# Patient Record
Sex: Female | Born: 1935 | Race: White | Hispanic: No | Marital: Married | Smoking: Former smoker
Health system: Southern US, Community
[De-identification: ages and names within clinical notes are randomized; demographics above are authoritative.]

## PROBLEM LIST (undated history)

## (undated) DIAGNOSIS — J189 Pneumonia, unspecified organism: Secondary | ICD-10-CM

## (undated) DIAGNOSIS — F0390 Unspecified dementia without behavioral disturbance: Secondary | ICD-10-CM

## (undated) DIAGNOSIS — K589 Irritable bowel syndrome without diarrhea: Secondary | ICD-10-CM

## (undated) DIAGNOSIS — Z7952 Long term (current) use of systemic steroids: Secondary | ICD-10-CM

## (undated) DIAGNOSIS — I509 Heart failure, unspecified: Secondary | ICD-10-CM

## (undated) DIAGNOSIS — S72009A Fracture of unspecified part of neck of unspecified femur, initial encounter for closed fracture: Secondary | ICD-10-CM

## (undated) DIAGNOSIS — T7840XA Allergy, unspecified, initial encounter: Secondary | ICD-10-CM

## (undated) DIAGNOSIS — M797 Fibromyalgia: Secondary | ICD-10-CM

## (undated) DIAGNOSIS — R002 Palpitations: Secondary | ICD-10-CM

## (undated) HISTORY — DX: Long term (current) use of systemic steroids: Z79.52

## (undated) HISTORY — DX: Pneumonia, unspecified organism: J18.9

## (undated) HISTORY — DX: Allergy, unspecified, initial encounter: T78.40XA

## (undated) HISTORY — DX: Palpitations: R00.2

## (undated) HISTORY — PX: COLONOSCOPY: SHX174

## (undated) HISTORY — DX: Fracture of unspecified part of neck of unspecified femur, initial encounter for closed fracture: S72.009A

## (undated) HISTORY — PX: CYST EXCISION: SHX5701

## (undated) HISTORY — DX: Heart failure, unspecified: I50.9

## (undated) HISTORY — DX: Fibromyalgia: M79.7

## (undated) HISTORY — DX: Irritable bowel syndrome without diarrhea: K58.9

---
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
## 1980-02-06 HISTORY — PX: NASAL SINUS SURGERY: SHX719

## 1996-02-06 HISTORY — PX: CHOLECYSTECTOMY: SHX55

## 1997-07-26 ENCOUNTER — Ambulatory Visit (HOSPITAL_COMMUNITY): Admission: RE | Admit: 1997-07-26 | Discharge: 1997-07-26 | Payer: Self-pay | Admitting: Gastroenterology

## 1997-11-21 ENCOUNTER — Emergency Department (HOSPITAL_COMMUNITY): Admission: EM | Admit: 1997-11-21 | Discharge: 1997-11-21 | Payer: Self-pay

## 1998-10-14 ENCOUNTER — Other Ambulatory Visit: Admission: RE | Admit: 1998-10-14 | Discharge: 1998-10-14 | Payer: Self-pay | Admitting: *Deleted

## 2000-05-07 ENCOUNTER — Emergency Department (HOSPITAL_COMMUNITY): Admission: EM | Admit: 2000-05-07 | Discharge: 2000-05-07 | Payer: Self-pay | Admitting: Emergency Medicine

## 2000-09-04 ENCOUNTER — Encounter (INDEPENDENT_AMBULATORY_CARE_PROVIDER_SITE_OTHER): Payer: Self-pay | Admitting: Specialist

## 2000-09-04 ENCOUNTER — Ambulatory Visit (HOSPITAL_COMMUNITY): Admission: RE | Admit: 2000-09-04 | Discharge: 2000-09-04 | Payer: Self-pay | Admitting: Gastroenterology

## 2001-12-16 ENCOUNTER — Other Ambulatory Visit: Admission: RE | Admit: 2001-12-16 | Discharge: 2001-12-16 | Payer: Self-pay | Admitting: *Deleted

## 2003-11-25 ENCOUNTER — Ambulatory Visit (HOSPITAL_COMMUNITY): Admission: RE | Admit: 2003-11-25 | Discharge: 2003-11-25 | Payer: Self-pay | Admitting: Gastroenterology

## 2003-12-14 ENCOUNTER — Ambulatory Visit: Payer: Self-pay | Admitting: Gastroenterology

## 2003-12-22 ENCOUNTER — Ambulatory Visit (HOSPITAL_COMMUNITY): Admission: RE | Admit: 2003-12-22 | Discharge: 2003-12-22 | Payer: Self-pay | Admitting: Gastroenterology

## 2003-12-22 ENCOUNTER — Ambulatory Visit: Payer: Self-pay | Admitting: Gastroenterology

## 2004-11-28 ENCOUNTER — Observation Stay (HOSPITAL_COMMUNITY): Admission: AD | Admit: 2004-11-28 | Discharge: 2004-11-30 | Payer: Self-pay | Admitting: Internal Medicine

## 2004-11-28 ENCOUNTER — Ambulatory Visit: Payer: Self-pay | Admitting: Gastroenterology

## 2005-09-06 ENCOUNTER — Other Ambulatory Visit: Admission: RE | Admit: 2005-09-06 | Discharge: 2005-09-06 | Payer: Self-pay | Admitting: *Deleted

## 2006-07-18 ENCOUNTER — Emergency Department (HOSPITAL_COMMUNITY): Admission: EM | Admit: 2006-07-18 | Discharge: 2006-07-19 | Payer: Self-pay | Admitting: Emergency Medicine

## 2010-06-23 NOTE — Procedures (Signed)
[facility]  Patient:    [name], [name]                      MRN: [id number] Proc. Date: 09/04/00 Adm. Date:  [id number] Attending:  Mardella Layman                           Procedure Report  Ms. Palma is a 77 year old white female who has a family history of colon carcinoma in her father.  She has a long history of laxative-dependent constipation.  It is felt that full colonoscopy is indicated for diagnostic purposes.  The risks and benefits of this procedure explained in detail, and she agreed to proceed as planned.  Preoperative coronary, pulmonary, and mental status exams were unremarkable.  Throughout this procedure, she was on pulse oximetry and cardiac monitorization.  COLONOSCOPY REPORT:  The patient was sedated during this procedure with 100 mcg of IV fentanyl and 5 mg of IV Versed.  Inspection of her rectum showed large, bulging external hemorrhoids.  Her rectum was intubated using the Olympus adult video colonoscope.  This was advanced without difficulty throughout the length of the length of the colon and the cecum.  In the cecum, there was a 17-18 mm bilobed sessile polyp.  This was removed with electrocautery and snared on an 18 watt coag setting.  The polyp was retrieved and sent to pathology for exam. There was an additional smaller polyp that was removed with the Williams hot biopsy forceps and labeled specimen #2.  The colonoscope was then slowly withdrawn throughout the length of the colon which otherwise was free of cyst or mucosal polypoid lesions.  She was extubated without difficulty and tolerated this procedure well.  ASSESSMENT: 1. Large cecal polyp - rule out carcinoma. 2. Family history of colon carcinoma. 3. Recurrent rectal bleeding from large external hemorrhoids.  RECOMMENDATIONS: 1. Standard postpolypectomy orders and precautions. 2. Follow up on pathology result of polyp. 3. Follow-up colonoscopy in 2-3 years  time. 4. Local hemorrhoidal care.  The patient may perhaps need hemorrhoidectomy. DD:  09/04/00 TD:  09/04/00 Job: [id number]

## 2010-06-23 NOTE — H&P (Signed)
NAMEJACQUE, [name] NO.:  [id number]   MEDICAL RECORD NO.:  [id number]          PATIENT TYPE:  INP   LOCATION:  5158                         FACILITY:  [facility]   PHYSICIAN:  Vania Rea. Jarold Motto, M.D. [facility] OF BIRTH:  [birth year]   DATE OF ADMISSION:  11/28/2004  DATE OF DISCHARGE:                                HISTORY & PHYSICAL   CHIEF COMPLAINT:  Abdominal pain, bloating, and bleeding per rectum.   HISTORY OF PRESENT ILLNESS:  [name] is a 77 year old white female who  has a history of IBS which is prone to chronic constipation.  She is  laxative-dependent for decades.  Lately, her laxative regimen has consisted  mostly of Dulcolax.  Generally, she uses Dulcolax every other day and has a  bowel movement every other day.  Her stools are generally watery when she  does have them.  She generally has not had any melena or blood.  Two days  prior to admission, that evening, she had taken her Dulcolax.  Early that  morning at about 4 or 5 a.m. she awoke with intense cramping abdominal pain  and proceeded to have watery stools, dizziness, nausea, and generally  feeling unwell and aching all over.  She was not having any fevers.  At some  point during her stooling she did see some bright red blood per rectum.  However, this was not hematochezia.  The pain was mostly on the right side  of her abdomen, though it did spread to both sides of the abdomen.  The rest  of the day she did not feel great but the pain had subsided to a great  extent and she did not have repeat bleeding or stools.  She had an  appointment scheduled previously to see Dr. Jarold Motto in the office and she  saw him on the 24th of October.  She was quite anxious regarding the intense  severity of the pain that she had just recently had.  She was convinced that  there was something ominous about what had happened to her.  Dr. Jarold Motto  was somewhat concerned by the history of bleeding per rectum and  elected to  admit her for supportive care, laboratories, and a CT scan of the abdomen to  rule out ischemic colitis which was his primary concern.   ALLERGIES:  She has allergies to DEMEROL, HYDROMORPHONE, CODEINE, NOVACAINE,  COMPAZINE, TETANUS SHOT.   CURRENT MEDICATIONS:  1.  Protonix 40 mg daily.  2.  Valium 5 mg p.o. once or twice a day as needed.  3.  Tylenol as needed.  4.  Dulcolax two p.o. every other day.   PAST MEDICAL HISTORY:  1.  Irritable bowel syndrome, constipation prone and laxative-dependent.  2.  History of colon polyps and family history of colon cancer.  Her latest      colonoscopy was performed November 2005.  This was a normal examination      to the cecum.  He did not see AVMs, colitis, evidence for inflammatory      bowel disease nor did he see diverticulosis or melanosis  coli.  3.  Status post __________ marker study within normal limits.  4.  Status post cholecystectomy.  5.  Para 2 status post spontaneous abortion and status post one vaginal      delivery.  6.  Multiple drug intolerances as listed above.  7.  Severe anxiety.  8.  History of external hemorrhoids seen on prior colonoscopy of 2002.  9.  History of cataracts.  She has had surgical extraction on the left and      she also has a right-sided cataract which has not required surgery to      date.  10. Status post lung surgery twice in 1956.  Patient does not know the      details but it may have been some complications from an infection and it      sounds like she may have had a partial lobectomy and this was on the      left side.  11. Status post sinus surgery bilaterally.   SOCIAL HISTORY:  Patient lives in [place] with her husband of 48 years.  They have one adult son.  She quit smoking when she was in her 90s.  She  does not consume alcoholic beverages.   FAMILY HISTORY:  Remarkable for colon cancer in her father.  Her father died  in his early 51s and her mother died at 32 and had  suffered a heart attack  near the end of her life.   REVIEW OF SYSTEMS:  NEUROLOGIC:  Denies headaches, denies visual changes.  PSYCHIATRIC:  Does admit to being anxious.  Has never seen a mental health  professional or had any psychiatric admissions.  Denies suicidal ideation.  ENT:  Does get sinus congestion with some frequency and allergic sinusitis.  GENITOURINARY:  Denies urgency, frequency, or incontinence.  HEMATOLOGIC:  Denies bruising problems, inappropriate bleeding.  DERMATOLOGIC:  Denies  skin cancer, rashes, or pruritus.  CARDIOVASCULAR:  Denies history of  cardiovascular disease, chest pain, or palpitations.  PULMONARY:  Denies  shortness of breath, cough, or pleuritic pain.   PHYSICAL EXAMINATION:  VITAL SIGNS:  Temperature 96.7, blood pressure  134/77, room air saturation 100%.  GENERAL:  Patient is a nontoxic-appearing, but very anxious (I would say on  a scale of 1-10 that her anxiety level is an 8 to a 9/10).  She is not in  pain.  HEENT:  Sclerae non-icteric.  Conjunctiva is pink.  Extraocular movements  are intact.  Oropharynx:  The mucosa is moist and clear.  NECK:  No JVD.  No masses.  CHEST:  Lung sounds are somewhat decreased, but clear bilaterally.  No  rales.  No rhonchi.  No shortness of breath.  No cough.  COR:  There is a regular rate and rhythm.  No murmurs, rubs, or gallops.  ABDOMEN:  Soft.  Bowel sounds are active.  There is some tenderness in the  right lower quadrant, but no associated rebound or guarding.  The abdomen is  moderately protuberant, but nondistended.  RECTAL:  There is red heme-positive stool on the examination glove.  No  masses appreciated.  EXTREMITIES:  No clubbing, cyanosis, edema.  PSYCHIATRIC:  Again, very anxious.  NEUROLOGIC:  No tremor.  Moves all four limbs and limb strength is full  bilaterally upper and lower extremities.   IMPRESSION:  1.  Abdominal pain. 2.  Diarrhea associated with blood per rectum.  This possibly  may be      secondary to acute colitis, especially ischemic  in a patient this age.  3.  Anxiety, severe.  4.  Status post cholecystectomy.  Nothing in her history suggests any kind      of biliary process.   PLAN:  Patient admitted to nontelemetry bed by Dr. Sheryn Bison.  She is  to undergo CT scan of the abdomen and pelvis.  Will provide supportive care  with IV fluids, clear liquid diet, and anxiolytic therapy.  As the abdominal  pain has pretty much resolved, we will hold on giving her any IV analgesics  for the timebeing.  Plan to get a battery of the usual chemistries and  hematologic tests.      Jennye Moccasin, P.A. [facility]    ______________________________  Vania Rea. Jarold Motto, M.D. [facility]    SG/MEDQ  D:  11/29/2004  T:  11/29/2004  Job:  [id number]

## 2010-06-23 NOTE — Discharge Summary (Signed)
NAMECHRISHELLE, ZITO NO.:  [id number]   MEDICAL RECORD NO.:  [id number]          PATIENT TYPE:  INP   LOCATION:  5158                         FACILITY:  [facility]   PHYSICIAN:  Iva Boop, M.D. [facility] OF BIRTH:  [birth year]   DATE OF ADMISSION:  11/28/2004  DATE OF DISCHARGE:  11/30/2004                                 DISCHARGE SUMMARY   ADMISSION DIAGNOSES:  1.  Abdominal pain, acute on chronic.  Rule out functional, rule out      colitis.  2.  Episode of diarrhea, associated with bleeding per rectum.  This occurred      24 hours prior to presenting to the doctor's office on the day of      admission, and resolved by the time of her office visit.  Rule out acute      colitis, specifically ischemic colitis.  3.  History of irritable bowel syndrome, constipation-prone and laxative-      dependent.  4.  Anxiety, severe.  5.  Status post cholecystectomy.  6.  Status post Sitzmark study, within normal limits, despite the patient's      complaints of severe constipation over the past few decades.  7.  Para 2, status post spontaneous abortion and one vaginal delivery.  8.  Multiple drug intolerances, including DEMEROL, HYDROMORPHONE, CODEINE,      NOVOCAIN, COMPAZINE AND TETANUS SHOTS.  9.  History of external hemorrhoids, seen on prior colonoscopy in 2002.      Latest colonoscopy in 2005, showed a normal examination to the cecum.  10. History of cataracts, status post left-sided cataract extraction and      right cataract existing but not yet excised.  11. Status post lung surgery twice in 1956.  Details not known, but sounds      like it may have been a partial lobectomy stemming from complications of      infection.  12. Status post sinus surgery bilaterally.  This may have been removal of      nasal polyps.   HISTORY OF PRESENT ILLNESS:  Ms. Detzel is a 77 year old lady who has  chronic constipation.  She has been using laxative for decades.  Most  recently she takes a couple of Dulcolax every other day, which is what she  needs to do to have a bowel movement.  On Sunday night, she took her usual  Dulcolax.  At about 4 or 5 a.m. she awoke with intense abdominal cramping  and proceeded to have watery stools, dizziness, achiness, nausea but no  emesis, and eventually did pass some blood.  This was not a large amount of  blood.  The pain was most prominent on the right abdomen, but it was  bilateral.  The acute episode lasted for a few hours, and the rest of that  day she felt generally unwell, with some lingering complaint of gas and  bulging in the right abdomen; however, this complaint of distention and  feeling like there were something bulging on the right side is not a new  phenomenon for this patient.  She does have  fairly chronic occurrences of  abdominal pain, but generally not as severe as what she experienced after  this flare.   The patient had a scheduled office visit with Dr. Sheryn Bison for the  following day, Tuesday morning.  On abdominal exam she did have tenderness  in the right lower quadrant.  There was no palpable bulge, despite the  patient insistent that there was something there.  She was also talking  about seeing white flecks of objects in her stool and questioned parasites.  She has been eating well and not having any weight loss in general.  On  rectal exam she did have a red heme-positive stool, and Dr. Eloise Harman thought  she might have had a bout of ischemic colitis, and therefore admitted her  for supportive care and for a CT scan of the abdomen.  The patient had not  had any fevers.  The vital signs in the office were stable.  She was not  hypotensive and not tachycardic.  Her weight was 146 pounds and stable.   LABORATORY DATA:  Amylase 20, lipase 23.  Sodium 134, potassium 3.3,  chloride 99, carbon dioxide 25, BUN 8, creatinine 0.9, glucose 94.  Transaminases, alkaline phosphatase, total bilirubin,  total protein and  albumin all within normal limits.  PT 13.4, INR 1.0, PTT 24.  White blood  cell count 7.9, hemoglobin 11.2, hematocrit 33, MCV 83.8, platelets 278.  Erythrocyte sedimentation rate 27.  The reference for that is 0-22.   IMAGING STUDIES:  A CT scan of the abdomen and pelvis was unremarkable.  She  did have some minimal sigmoid diverticulosis but no evidence for  diverticulitis.  Radiology noted an incidental cervical cyst.  She had  degenerative changes in both hips.  An acute abdominal series:  Showed no  evidence of bowel obstruction or free intra-peritoneal air.  There were  postoperative changes in the left hemithorax with left hilar prominence.  They suggested comparison with prior x-rays, should it be clinically  indicated.  Follow-up and PA lateral views of the chest would also be  helpful for assessment of stability of this finding.   HOSPITAL COURSE:  The patient stayed in the hospital for about 1-1/2 days.  She was admitted and started on IV fluids.  Initial diet was clear liquids.  She was continued on her outpatient medications.  Neither the plain  abdominal films nor the CT scan of the abdomen and pelvis were able to  ascertain any source for the patient's complaint of abdominal pain or  explain the findings of blood per rectum.  She did not have colitis, nor did  she have diverticulitis.  The patient did not have any bowel movements and  there was some thought that she might have some residual constipation, and  therefore she was treated with some soap suds enemas.  She also received a  Dulcolax suppository.  This resulted in only a small amount of murky fluid,  but no solid stool.  She continued to be obsessed with the fact that she had  distention in the right side of her abdomen and could feel a mass there.  This was not appreciated by any of the physicians' exams.  She also complained that she was seeing white flecks of material in her stool, and   thought that she had parasites; however, the patient does not have chronic  diarrhea.  She is pretty much chronically constipated and then has loose  stools after taking her laxative regimen.  We did not test her for any  infectious sources of diarrhea/colitis.   The patient's diet was advanced, ultimately up to a solid diet which she  tolerated well.  She did not have any nausea, vomiting, or any recurrence of  rectal bleeding.  The probable source of the rectal bleeding is her  hemorrhoids.  On the latest colonoscopy in November 2005, the patient had no  recurrence of colon polyps.   The patient's most noticeable issue was her rather severe anxiety. Her  speech was pressured.  She would ramble on about her bowels and about  medical history from 50 years ago.  She admitted that she has occasional  anxiety and does use Valium as needed, but apparently does not take this  very often.  She has never been formerly evaluated for any kind of  psychiatric disorder.  During this admission she was highly anxious.  We did  suggest to her that if the anxiety continued, or if her physicians feel that  the anxiety is a lingering issue, that she could benefit from psychiatric or  psychological assessment .  Another factor at play here might be some early  dementia.  In any event, she was provided with a number for Dr. Onalee Hua L.  Dellia Cloud, Ph.D., the clinical psychiatrist who works at the [facility].  She was encouraged to call his number if she felt that the anxiety was still  an issue.   CONDITION ON DISCHARGE:  The patient was in stable but anxious condition at  the time of discharge.  Her abdominal exam was benign with just a little bit  of tenderness on the right  The patient has not used Zelnorm in the past,  and it may be that this drug might benefit her; however, this may be beyond  her medication budget, and it was decided that when she returns to see Dr.  Jarold Motto, that he could  provide her with samples from the office if he  chose to trial this drug.  The patient has also used MiraLax in the past,  but said it was not very effective.  Perhaps she needs to use MiraLax at a  higher dose, maybe three to five times in a day if she gets severe  constipation; however, this again can be addressed when she returns to see  Dr. Jarold Motto at the office.   DISCHARGE MEDICATIONS:  1.  Protonix 40 mg daily.  2.  Valium 5 mg, one p.o., one to two times daily as needed.  3.  Tylenol as needed.  4.  Dulcolax two p.o. q.o.d.   FOLLOWUP:  A return office visit with Dr. Jarold Motto on December 19, 2004, at  10 a.m.   DISCHARGE DIAGNOSES:  1.  Constipation-redominant irritable bowel syndrome.  2.  Transient diarrhea and abdominal pain, with the diarrhea resolved prior      to admission and abdominal pain resolving during the course of      hospitalization. 3.  History of scant hematochezia, resolved prior to admission.  No evidence      for acute colitis.  4.  Severe anxiety.  5.  Multiple drug intolerances and allergies.  6.  Left hilar prominence:  May need to have follow-up chest films in      several weeks, in order to ascertain that this is stable.   DISCHARGE DIET:  A soft bland diet.      Jennye Moccasin, P.A. [facility]      Iva Boop,  M.D. [facility]  Electronically Signed    SG/MEDQ  D:  11/30/2004  T:  12/01/2004  Job:  [id number]

## 2010-07-08 ENCOUNTER — Emergency Department (HOSPITAL_COMMUNITY)
Admission: EM | Admit: 2010-07-08 | Discharge: 2010-07-09 | Disposition: A | Discharge status: Home / Self Care | Payer: Medicare Other | Attending: Emergency Medicine | Admitting: Emergency Medicine

## 2010-07-08 ENCOUNTER — Emergency Department (HOSPITAL_COMMUNITY): Payer: Medicare Other

## 2010-07-08 DIAGNOSIS — R3915 Urgency of urination: Secondary | ICD-10-CM | POA: Insufficient documentation

## 2010-07-08 DIAGNOSIS — Z79899 Other long term (current) drug therapy: Secondary | ICD-10-CM | POA: Insufficient documentation

## 2010-07-08 DIAGNOSIS — R0609 Other forms of dyspnea: Secondary | ICD-10-CM | POA: Insufficient documentation

## 2010-07-08 DIAGNOSIS — R300 Dysuria: Secondary | ICD-10-CM | POA: Insufficient documentation

## 2010-07-08 DIAGNOSIS — R0989 Other specified symptoms and signs involving the circulatory and respiratory systems: Secondary | ICD-10-CM | POA: Insufficient documentation

## 2010-07-08 DIAGNOSIS — I491 Atrial premature depolarization: Secondary | ICD-10-CM | POA: Insufficient documentation

## 2010-07-08 DIAGNOSIS — M79609 Pain in unspecified limb: Secondary | ICD-10-CM | POA: Insufficient documentation

## 2010-07-08 DIAGNOSIS — R05 Cough: Secondary | ICD-10-CM | POA: Insufficient documentation

## 2010-07-08 DIAGNOSIS — R059 Cough, unspecified: Secondary | ICD-10-CM | POA: Insufficient documentation

## 2010-07-08 LAB — URINALYSIS, ROUTINE W REFLEX MICROSCOPIC
Bilirubin Urine: NEGATIVE
Glucose, UA: NEGATIVE mg/dL
Hgb urine dipstick: NEGATIVE
Ketones, ur: NEGATIVE mg/dL
Nitrite: NEGATIVE
Protein, ur: NEGATIVE mg/dL
Specific Gravity, Urine: 1.001 — ABNORMAL LOW (ref 1.005–1.030)
Urobilinogen, UA: 0.2 mg/dL (ref 0.0–1.0)
pH: 7 (ref 5.0–8.0)

## 2010-07-09 ENCOUNTER — Emergency Department (HOSPITAL_COMMUNITY)
Admission: EM | Admit: 2010-07-09 | Discharge: 2010-07-09 | Disposition: A | Discharge status: Home / Self Care | Payer: Medicare Other | Attending: Emergency Medicine | Admitting: Emergency Medicine

## 2010-07-09 DIAGNOSIS — Z79899 Other long term (current) drug therapy: Secondary | ICD-10-CM | POA: Insufficient documentation

## 2010-07-09 DIAGNOSIS — IMO0001 Reserved for inherently not codable concepts without codable children: Secondary | ICD-10-CM | POA: Insufficient documentation

## 2010-07-09 DIAGNOSIS — R059 Cough, unspecified: Secondary | ICD-10-CM | POA: Insufficient documentation

## 2010-07-09 DIAGNOSIS — R05 Cough: Secondary | ICD-10-CM | POA: Insufficient documentation

## 2010-08-03 ENCOUNTER — Inpatient Hospital Stay (HOSPITAL_COMMUNITY)
Admission: EM | Admit: 2010-08-03 | Discharge: 2010-08-06 | DRG: 641 | Disposition: A | Discharge status: Home / Self Care | Payer: Medicare Other | Attending: Family Medicine | Admitting: Family Medicine

## 2010-08-03 ENCOUNTER — Observation Stay (HOSPITAL_COMMUNITY): Payer: Medicare Other

## 2010-08-03 ENCOUNTER — Emergency Department (HOSPITAL_COMMUNITY): Payer: Medicare Other

## 2010-08-03 DIAGNOSIS — F341 Dysthymic disorder: Secondary | ICD-10-CM | POA: Diagnosis present

## 2010-08-03 DIAGNOSIS — K589 Irritable bowel syndrome without diarrhea: Secondary | ICD-10-CM | POA: Diagnosis present

## 2010-08-03 DIAGNOSIS — R112 Nausea with vomiting, unspecified: Secondary | ICD-10-CM | POA: Diagnosis present

## 2010-08-03 DIAGNOSIS — G8929 Other chronic pain: Secondary | ICD-10-CM | POA: Diagnosis present

## 2010-08-03 DIAGNOSIS — E872 Acidosis, unspecified: Secondary | ICD-10-CM | POA: Diagnosis present

## 2010-08-03 DIAGNOSIS — H9190 Unspecified hearing loss, unspecified ear: Secondary | ICD-10-CM | POA: Diagnosis present

## 2010-08-03 DIAGNOSIS — M353 Polymyalgia rheumatica: Secondary | ICD-10-CM | POA: Diagnosis present

## 2010-08-03 DIAGNOSIS — R197 Diarrhea, unspecified: Secondary | ICD-10-CM | POA: Diagnosis present

## 2010-08-03 DIAGNOSIS — E871 Hypo-osmolality and hyponatremia: Principal | ICD-10-CM | POA: Diagnosis present

## 2010-08-03 LAB — DIFFERENTIAL
Band Neutrophils: 0 % (ref 0–10)
Basophils Absolute: 0 10*3/uL (ref 0.0–0.1)
Basophils Relative: 0 % (ref 0–1)
Blasts: 0 %
Eosinophils Absolute: 0 10*3/uL (ref 0.0–0.7)
Eosinophils Relative: 0 % (ref 0–5)
Lymphocytes Relative: 27 % (ref 12–46)
Lymphs Abs: 1.1 10*3/uL (ref 0.7–4.0)
Metamyelocytes Relative: 0 %
Monocytes Absolute: 0 10*3/uL — ABNORMAL LOW (ref 0.1–1.0)
Monocytes Relative: 1 % — ABNORMAL LOW (ref 3–12)
Myelocytes: 0 %
Neutro Abs: 3 10*3/uL (ref 1.7–7.7)
Neutrophils Relative %: 72 % (ref 43–77)
Promyelocytes Absolute: 0 %
nRBC: 0 /100 WBC

## 2010-08-03 LAB — CBC
HCT: 40 % (ref 36.0–46.0)
Hemoglobin: 12.6 g/dL (ref 12.0–15.0)
MCH: 24.2 pg — ABNORMAL LOW (ref 26.0–34.0)
MCHC: 31.5 g/dL (ref 30.0–36.0)
MCV: 76.8 fL — ABNORMAL LOW (ref 78.0–100.0)
Platelets: 427 10*3/uL — ABNORMAL HIGH (ref 150–400)
RBC: 5.21 MIL/uL — ABNORMAL HIGH (ref 3.87–5.11)
RDW: 14.8 % (ref 11.5–15.5)
WBC: 4.1 10*3/uL (ref 4.0–10.5)

## 2010-08-03 LAB — COMPREHENSIVE METABOLIC PANEL
ALT: 7 U/L (ref 0–35)
AST: 29 U/L (ref 0–37)
Albumin: 4.8 g/dL (ref 3.5–5.2)
Alkaline Phosphatase: 82 U/L (ref 39–117)
BUN: 10 mg/dL (ref 6–23)
CO2: 15 mEq/L — ABNORMAL LOW (ref 19–32)
Calcium: 10.5 mg/dL (ref 8.4–10.5)
Chloride: 79 mEq/L — ABNORMAL LOW (ref 96–112)
Creatinine, Ser: 0.88 mg/dL (ref 0.50–1.10)
GFR calc Af Amer: >60 mL/min (ref >60)
GFR calc non Af Amer: >60 mL/min (ref >60)
Glucose, Bld: 51 mg/dL — ABNORMAL LOW (ref 70–99)
Potassium: 4 mEq/L (ref 3.5–5.1)
Sodium: 119 mEq/L — CL (ref 135–145)
Total Bilirubin: 0.4 mg/dL (ref 0.3–1.2)
Total Protein: 8.7 g/dL — ABNORMAL HIGH (ref 6.0–8.3)

## 2010-08-03 LAB — URINALYSIS, ROUTINE W REFLEX MICROSCOPIC
Bilirubin Urine: NEGATIVE
Glucose, UA: NEGATIVE mg/dL
Hgb urine dipstick: NEGATIVE
Ketones, ur: >80 mg/dL — AB
Leukocytes, UA: NEGATIVE
Nitrite: NEGATIVE
Protein, ur: NEGATIVE mg/dL
Specific Gravity, Urine: 1.018 (ref 1.005–1.030)
Urobilinogen, UA: 0.2 mg/dL (ref 0.0–1.0)
pH: 5 (ref 5.0–8.0)

## 2010-08-03 LAB — BASIC METABOLIC PANEL
BUN: 6 mg/dL (ref 6–23)
CO2: 18 mEq/L — ABNORMAL LOW (ref 19–32)
Calcium: 9.1 mg/dL (ref 8.4–10.5)
Chloride: 92 mEq/L — ABNORMAL LOW (ref 96–112)
Creatinine, Ser: 0.7 mg/dL (ref 0.50–1.10)
GFR calc Af Amer: >60 mL/min (ref >60)
GFR calc non Af Amer: >60 mL/min (ref >60)
Glucose, Bld: 75 mg/dL (ref 70–99)
Potassium: 4 mEq/L (ref 3.5–5.1)
Sodium: 124 mEq/L — ABNORMAL LOW (ref 135–145)

## 2010-08-03 LAB — SODIUM, URINE, RANDOM: Sodium, Ur: 28 mEq/L

## 2010-08-03 LAB — LACTIC ACID, PLASMA: Lactic Acid, Venous: 1.2 mmol/L (ref 0.5–2.2)

## 2010-08-03 LAB — SALICYLATE LEVEL: Salicylate Lvl: <2 mg/dL — ABNORMAL LOW (ref 2.8–20.0)

## 2010-08-03 LAB — OSMOLALITY: Osmolality: 265 mOsm/kg — ABNORMAL LOW (ref 275–300)

## 2010-08-03 LAB — ETHANOL: Alcohol, Ethyl (B): <11 mg/dL (ref 0–11)

## 2010-08-04 LAB — CBC
HCT: 30.3 % — ABNORMAL LOW (ref 36.0–46.0)
Hemoglobin: 9.6 g/dL — ABNORMAL LOW (ref 12.0–15.0)
MCH: 24.4 pg — ABNORMAL LOW (ref 26.0–34.0)
MCHC: 31.7 g/dL (ref 30.0–36.0)
MCV: 76.9 fL — ABNORMAL LOW (ref 78.0–100.0)

## 2010-08-04 LAB — BASIC METABOLIC PANEL
BUN: 5 mg/dL — ABNORMAL LOW (ref 6–23)
Calcium: 8.9 mg/dL (ref 8.4–10.5)
GFR calc non Af Amer: >60 mL/min (ref >60)
Glucose, Bld: 101 mg/dL — ABNORMAL HIGH (ref 70–99)

## 2010-08-05 DIAGNOSIS — F411 Generalized anxiety disorder: Secondary | ICD-10-CM

## 2010-08-05 DIAGNOSIS — F0630 Mood disorder due to known physiological condition, unspecified: Secondary | ICD-10-CM

## 2010-08-05 LAB — CBC
MCH: 24.1 pg — ABNORMAL LOW (ref 26.0–34.0)
MCV: 76.7 fL — ABNORMAL LOW (ref 78.0–100.0)
Platelets: 348 10*3/uL (ref 150–400)
RBC: 3.78 MIL/uL — ABNORMAL LOW (ref 3.87–5.11)

## 2010-08-05 LAB — BASIC METABOLIC PANEL
CO2: 24 mEq/L (ref 19–32)
Calcium: 8.3 mg/dL — ABNORMAL LOW (ref 8.4–10.5)
Creatinine, Ser: 0.6 mg/dL (ref 0.50–1.10)

## 2010-08-05 LAB — SEDIMENTATION RATE: Sed Rate: 25 mm/hr — ABNORMAL HIGH (ref 0–22)

## 2010-08-05 NOTE — Consult Note (Signed)
[name], TEP NO.:  [id number]  MEDICAL RECORD NO.:  [id number]  LOCATION:  1342                         FACILITY:  [facility]  PHYSICIAN:  Franchot Gallo, MD     DATE OF BIRTH:  [birth year]  DATE OF CONSULTATION:  08/05/2010 DATE OF DISCHARGE:                                CONSULTATION   CHIEF COMPLAINT:  "I would not be depressed if I did not have all these physical problems."  HISTORY OF PRESENT ILLNESS:  [name] is a 77 year old married white female, who appears to be admitted to [facility] for treatment of nausea, vomiting, and diarrhea, which was occurring approximately 4 days prior to admission.  Mental Health was consulted to recommend medications for depression and to help with her anxiety and appetite.  It was very difficult to obtain information from the patient during the interview secondary to reports that she is "hard-of-hearing."  The patient, however, was very somatic in her presentation and questions presented to her seem to always leave back to issues with her pain.  The patient did state that she was having difficulty initiating and maintaining sleep and reported significant decrease in appetite.  She reported moderate feelings of sadness, anhedonia, and depressed mood, but denied any suicidal or homicidal ideations.  She also denied any auditory or visual hallucinations or delusional thinking.  In addition, she also denied any issues with prolonged manic or hypomanic symptoms.  The patient denied any substance abuse related issues.  Mental Health was consulted to make recommendations concerning her anxiety, depression, and pain as well as her decreased appetite.  PAST PSYCHIATRIC HISTORY:  The patient denies any past psychiatric hospitalizations.  CURRENT MEDICATIONS: 1. Enoxaparin 40 mg subcu q.h.s. 2. Solu-Medrol 40 mg IV q.12 h. 3. Reglan 10 mg p.o. t.i.d. 4. Protonix 40 mg p.o. q. noon. 5. Albuterol inhaler  q.2 h. as needed. 6. Hydrocodone/APAP 2 tablets p.o. q.4 h. - p.r.n. 7. Ativan 2 mg IV q.2 h - p.r.n. 8. Zofran 4 mg p.o. q.8 h. 9. Ambien 5 mg p.o. q.h.s. - p.r.n. for sleep.  ALLERGIES: 1. Demerol - rash. 2. Hydromorphone - rash. 3. Codeine - rash. 4. Novocaine - rash. 5. Prochlorperazine - rash. 6. Tetanus toxin - unknown reaction.  MEDICAL ILLNESSES: 1. History of polymyalgia rheumatitis. 2. Irritable bowel syndrome. 3. History of colonic polyps.  PAST OPERATIONS: 1. Status post cholecystectomy. 2. Status post right lung lobectomy for pneumonia.  FAMILY HISTORY:  The patient denies any family history of psychiatric or substance related illnesses.  SOCIAL HISTORY:  The patient was born and raised in [place], [place] and currently lives in [place] with her husband.  The patient denies any use of alcohol or illicit drugs.  MENTAL STATUS EXAMINATION:  General - the patient was somewhat sedated in appearance, but was oriented x3.  Speech was appropriate in terms of rate and volume.  Mood appeared moderately depressed.  Affect was moderately constricted.  Thoughts - the patient denied any obvious delusions or hallucinations nor does she report any suicidal or homicidal ideations.  Judgment and insight both appeared fair.  IMPRESSION:   Axis I: 1. 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 2. Generalized anxiety disorder - currently under fair control. Axis II:  Deferred. Axis III:  Please see medical history above. Axis IV:  Chronic pain issues.  Longstanding chronic psychiatric issues. Axis V:  Global assessment of functioning at the time of admission approximately 45.  Highest global assessment of functioning past year approximately 60.  PLAN: 1. I would recommend that the patient be started on the medication     Cymbalta at 30 mg p.o. q.a.m. to begin to address her pain issues,     her depression and her anxiety.  It is  recommended that this     medication be increased to 60 mg after a week if the patient     is able to tolerate the lower dose of Cymbalta. 2. It is also recommended that the patient start Remeron at 15 mg     p.o. q.h.s. to help with her sleep, anxiety, depression and her     appetite problem.  Remeron has been shown to increase appetite     particularly in the geriatric patient. 3. Although, it may be difficult, I would recommend limiting the     narcotics and benzodiazepine usage with this patient. 4. Please reconsult Mental Health should further recommendations be     needed.   __________________________________ Franchot Gallo, MD    RR/MEDQ  D:  08/05/2010  T:  08/05/2010  Job:  [id number]  Electronically Signed by Franchot Gallo MD on 08/05/2010 07:08:08 PM

## 2010-08-06 LAB — BASIC METABOLIC PANEL
BUN: 3 mg/dL — ABNORMAL LOW (ref 6–23)
Calcium: 9.1 mg/dL (ref 8.4–10.5)
Creatinine, Ser: 0.5 mg/dL (ref 0.50–1.10)
GFR calc non Af Amer: >60 mL/min (ref >60)
Glucose, Bld: 119 mg/dL — ABNORMAL HIGH (ref 70–99)
Sodium: 129 mEq/L — ABNORMAL LOW (ref 135–145)

## 2010-08-06 LAB — CBC
HCT: 28.3 % — ABNORMAL LOW (ref 36.0–46.0)
Hemoglobin: 9.1 g/dL — ABNORMAL LOW (ref 12.0–15.0)
MCH: 24.7 pg — ABNORMAL LOW (ref 26.0–34.0)
MCHC: 32.2 g/dL (ref 30.0–36.0)
MCV: 76.7 fL — ABNORMAL LOW (ref 78.0–100.0)

## 2010-08-07 NOTE — Discharge Summary (Signed)
[name], BALTHASER NO.:  [id number]  MEDICAL RECORD NO.:  [id number]  LOCATION:  1342                         FACILITY:  [facility]  PHYSICIAN:  Pleas Koch, MD        DATE OF BIRTH:  04-25-1930  DATE OF ADMISSION:  08/03/2010 DATE OF DISCHARGE:                              DISCHARGE SUMMARY   DISCHARGE DIAGNOSES: 1. Nausea and vomiting with no organic component but may be likely     secondary to recent upper respiratory infection. 2. Hyponatremia. 3. Polymyalgia rheumatica, now replaced on steroids. 4. Chronic pain. 5. Metabolic acidosis. 6. Possible irritable bowel syndrome. 7. Significant psych overlay.  DISCHARGE MEDICATIONS: 1. Zofran 4 mg p.o. q.8 p.r.n. nausea. 2. Prednisone 50 mg p.o. daily for 30 days. 3. Tylenol extra strength 1 tablet every 5 hours as needed. 4. The patient to continue diazepam 10 mg 1 q.h.s. 5. The patient also prescription now for 5 mg in the morning, 30     tablets prescribed. 6. Hydrocodone/APAP 10/325 one tablet every 5 hours as per prior home     medications. 7. Reglan 10 mg t.i.d. 8. Pantoprazole 1 tablet daily. 9. HCTZ 1 tablet q.a.m. 10.Cymbalta 30 mg p.o. q.a.m. as well as Remeron 50 mg p.o. q.h.s. to     help with anxiety and depression.  PERTINENT IMAGING STUDIES: 1. Chest x-ray on August 03, 2010, showed chronic changes, no active     disease. 2. Abdominal two-view showed no obstruction, prior cholecystectomy.  Briefly, this is a 77 year old female with past medical history of polymyalgia rheumatica maintained on chronic narcotic therapy, question history of IBS.  She is very poor historian as she was hard of hearing and history had been taken from husband as well.  She had a month ago developed respiratory-like infection with nausea, vomiting, and diarrhea and she was getting over that and she then had a recurrence 3 to 4 days ago of the same and she claims to have profuse vomiting and diarrhea. She has had  hardly any intake, only drank water.  She called primary care physician, she was advised to go to the ED.  She was found to have a sodium of 119, bicarb 15 and denied any fever, denied any NSAID use, denied any history of alcohol abuse, last bowel movement was in the morning.  General physical exam, blood pressure 151/80, respirations 20, pulse rate 94, temperature 96.9.  Abdomen was soft, nontender, nondistended.  WBC 4.1, hemoglobin 12.6, hematocrit 40.0, platelet count 427.  Sodium 119, potassium 4.0, chloride 79, bicarb 15, glucose 51, BUN of 10, creatinine 0.88.  She showed ketones in her urinalysis.  LFTs within normal limits.  HOSPITAL COURSE ACCORDING TO ISSUES: 1. Asymptomatic hyponatremia probably secondary to the patient's poor     p.o. intake and tea-toast diet and the fact that has just taken     liquids.  She continues to refuse taking food in the hospital     The patient was noted to have been better by day of discharge,     however, still fearful of taking po.  The patient's sodium has risen up to 130 and I suspect  that this will continue to improve if she does desist from taking     excessive p.o. fluids and then start eating. 2. Nausea, vomiting and diarrhea.  Reports and found that she was kept     on contact precautions initially, but it was noted that despite her     claiming to be nauseous she did not really have any episodes of     vomiting and I suspect that this may be due to chronic narcotic     use.  I will have Dr. Tenny Craw follow up with her as an outpatient to     evaluate for this given the fact that her opiates can also cause     nausea.  I would recommend as an outpatient that she follows up     with chronic pain specialist and be placed on some methadone     Suboxone as needed as she has multiple comorbidities and has been     on this medication for long time.  I am continuing her on her     regular Percocet and will discharge home on Zofran 3.  Polymyalgia rheumatica.  She had an elevated ESR which could be     nonspecific, however, she refused to take p.o. and I have placed on     Solu-Medrol initially.  I am going to switch her to prednisone 20     on discharge if she takes medications and she will be reviewed. 4. Chronic pain.  The patient was insistent on getting IV pain     medications initially.  However, we will transition to her other     medications.  I did get psychiatry consult and they recommended     also trying to limit the narcotics, benzodiazepines.  They also     recommended starting Cymbalta 30 mg q.a.m. which I will place as     the medication and Remeron I will add to [facility]. 5. Metabolic acidosis, resolved and likely secondary to starvation     ketosis.  The patient was seen on day of discharge doing well.  She     had no further nausea, vomiting reported by nursing.  Temperature was 98, pulse 75, blood pressure 120-133 over 73-78, respirations 15 to 16, saturating 96% on room air.  Chest clinically clear.  She had no tenderness whatsoever in abdomen.  She had mild pain in lower extremities but this is improved.  Her chest was clear.  S1, S2.  No murmurs, rubs or gallops.  It was a pleasure taking care of this patient.  The patient was discharged in stable state pending tolerating p.o. food this evening and  I did update her Husband about the POC.          ______________________________ Pleas Koch, MD     JS/MEDQ  D:  08/06/2010  T:  08/06/2010  Job:  [id number]  cc:   Magnus Sinning) Tenny Craw, M.D. Fax: [phone]  Electronically Signed by Pleas Koch MD on 08/07/2010 11:30:46 AM

## 2010-08-08 NOTE — H&P (Signed)
[name], TERRERO NO.:  [id number]  MEDICAL RECORD NO.:  [id number]  LOCATION:  1342                         FACILITY:  [facility]  PHYSICIAN:  Jeoffrey Massed, MD    DATE OF BIRTH:  1930-08-11  DATE OF ADMISSION:  08/03/2010 DATE OF DISCHARGE:                             HISTORY & PHYSICAL   PRIMARY CARE PRACTITIONER:  Hessie Diener (C.Alan) Tenny Craw, MD  CHIEF COMPLAINT:  Nausea, vomiting, and diarrhea for the past 3 to 4 days.  HISTORY OF PRESENT ILLNESS:  The patient is a 77 year old female with a past medical history of polymyalgia rheumatica, maintained on chronic narcotic therapy, questionable history of irritable bowel syndrome, comes in with the above-noted complaints.  Please note that this patient is a very poor historian because she is extremely difficult of hearing, so this history is not only obtained from the patient, but also from the ED note and also from the patient's husband, who was at bedside.  Per history obtained, apparently a month ago the patient had a viral syndrome with upper respiratory tract infection like symptoms, which then evolving to nausea, vomiting, and diarrhea.  The patient was getting over that and had advanced her diet and was on mostly liquid diet when she then had recurrence of similar symptoms only 3 to 4 days ago.  The patient claims that for the past 3 to 4 days, the patient has had numerous episodes of profuse vomiting and numerous episodes of diarrhea.  The patient has had hardly any p.o. intake for the past 3 to 4 days and only is able to drink some amount of water with her pain pills.  She did call her primary care doctor today and he advised her to go to the ED for further evaluation.  In the ED, the patient was found to have a sodium level of 119 with a bicarb of 15.  The patient denies any fever to me.  The patient denies any chest pain or shortness of breath.  The patient upon repeated asking denies any abdominal  pain. Claims her last bowel movement was this morning.  She denies any NSAID use.  There is no apparent history of alcohol abuse.  The patient denies any fever or skin rash.  Denies any urinary symptoms.  ALLERGIES:  The patient claims she is allergic to numerous medications, but does not know the names of them.  Claims she has written and down somewhere, but she does not recollect them currently and she does not have the list of the allergies with her, but from past records here in the chart, she is apparently allergic to: DEMEROL. HYDROMORPHONE. CODEINE. NOVOCAINE. COMPAZINE TETANUS SHOT.  PAST MEDICAL HISTORY: 1. Polymyalgia rheumatica for which she claims she is on chronic     narcotic therapy. 2. Irritable bowel syndrome per HP in the E chart in 2006. 3. History of colon polyps.  FAMILY HISTORY:  Colon cancer in father.  HOME MEDICATIONS:  Apparently, the patient is taking Norco, Protonix, Valium at an unknown dosing.  PAST SURGICAL HISTORY: 1. Status post cholecystectomy. 2. Status post right lung lobectomy for pneumonia.  FAMILY HISTORY:  Colon cancer in her father.  SOCIAL  HISTORY:  The patient lives in [place] with the husband.  She denies any toxic habits.  Repeatedly, denies any EtOH use.  REVIEW OF SYSTEMS:  A detailed review of 12 systems was done and the pertinent positives are mentioned in the HPI.  PHYSICAL EXAMINATION:  GENERAL:  Lying in bed, does not appear to be in distress, looks dry and pale, is extremely hard of hearing. VITAL SIGNS:  Blood pressure of 151/80, respiration of 20, pulse rate of 94, and temperature of 97.9. HEENT: Atraumatic, normocephalic.  Pupils equally reactive to light and accommodation.  Oral mucosa is very dry. NECK:  Supple. CHEST:  Bilaterally clear to auscultation. CARDIOVASCULAR:  Heart sounds are regular.  No murmurs heard. ABDOMEN:  Soft, nontender, and nondistended. EXTREMITIES:  There is no edema. NEUROLOGIC:   The patient is awake and alert, extremely hard of hearing but does not appear to have any focal neurological deficits.  LABORATORY DATA: 1. CBC shows a WBC of 4.1, hemoglobin of 12.6, hematocrit of 40.0, and     platelet count of 427. 2. Chemistry shows sodium of 119, potassium of 4.0, chloride of 79,     bicarb of 15, glucose of 51, BUN of 10, creatinine of 0.88, and     calcium of 10.5. 3. LFT shows AST of 29, ALT of 7, alkaline phosphatase of 82, total     bilirubin of 0.4, total protein of 8.7, and albumin of 4.8.  RADIOLOGICAL STUDIES:  A chest x-ray two-view shows chronic changes as above with no active disease.  ASSESSMENT: 1. Asymptomatic hyponatremia, probably secondary to dehydration given     the patient's history of nausea, vomiting, and diarrhea. 2. Anion gap metabolic acidosis of unknown cause.  The patient claims     she does not take any NSAIDs, does not take aspirin, or any EtOH.     She does not have any abdominal pain.  She is afebrile and nontoxic     looking.  There is no setting for sepsis or elevated lactic acid     level secondary to hypoperfusion.  Perhaps, this is all starvation     ketoacidosis. 3. Dehydration. 4. History of polymyalgia rheumatica. 5. Questionable history of irritable bowel syndrome.  PLAN: 1. This patient will be admitted to a regular medical bed. 2. She has already received 1 L of normal saline bolus in the ED and     she will be started on D5 normal saline at 75 cc an hour. 3. We will check a salicylate level, EtOH level, and a lactic acid     level given metabolic acidosis. 4. In regards to hyponatremia, as noted above this seems more likely     secondary to dehydration, but we will check serum osmolality, urine     osmolality, and urine sodium.  A random cortisol level will be     checked.  We will gently hydrate her with 75 cc of normal saline     for now and repeat a chemistry in this evening to see if the trend     is towards  improvement. 5. We will continue her Norco as on needed basis. 6. Further plan will depend as the patient's clinical course evolves. 7. Code status full code. 8. DVT prophylaxis with Lovenox.  Total time spent coordinating admission process 60 minutes.     Jeoffrey Massed, MD     SG/MEDQ  D:  08/03/2010  T:  08/03/2010  Job:  [id number]  Electronically Signed by  SHANKER GHIMIRE  on 08/08/2010 03:29:50 PM

## 2010-08-24 ENCOUNTER — Inpatient Hospital Stay (HOSPITAL_COMMUNITY)
Admission: EM | Admit: 2010-08-24 | Discharge: 2010-08-28 | DRG: 641 | Disposition: A | Discharge status: Home / Self Care | Payer: Medicare Other | Attending: Internal Medicine | Admitting: Internal Medicine

## 2010-08-24 DIAGNOSIS — E876 Hypokalemia: Secondary | ICD-10-CM | POA: Diagnosis present

## 2010-08-24 DIAGNOSIS — E871 Hypo-osmolality and hyponatremia: Principal | ICD-10-CM | POA: Diagnosis present

## 2010-08-24 DIAGNOSIS — M353 Polymyalgia rheumatica: Secondary | ICD-10-CM | POA: Diagnosis present

## 2010-08-24 DIAGNOSIS — R112 Nausea with vomiting, unspecified: Secondary | ICD-10-CM | POA: Diagnosis present

## 2010-08-24 DIAGNOSIS — I4891 Unspecified atrial fibrillation: Secondary | ICD-10-CM | POA: Diagnosis present

## 2010-08-24 DIAGNOSIS — R630 Anorexia: Secondary | ICD-10-CM | POA: Diagnosis present

## 2010-08-24 DIAGNOSIS — K589 Irritable bowel syndrome without diarrhea: Secondary | ICD-10-CM | POA: Diagnosis present

## 2010-08-24 DIAGNOSIS — R631 Polydipsia: Secondary | ICD-10-CM | POA: Diagnosis present

## 2010-08-24 DIAGNOSIS — F3289 Other specified depressive episodes: Secondary | ICD-10-CM | POA: Diagnosis present

## 2010-08-24 DIAGNOSIS — E46 Unspecified protein-calorie malnutrition: Secondary | ICD-10-CM | POA: Diagnosis present

## 2010-08-24 DIAGNOSIS — F329 Major depressive disorder, single episode, unspecified: Secondary | ICD-10-CM | POA: Diagnosis present

## 2010-08-24 LAB — BASIC METABOLIC PANEL
Calcium: 9.6 mg/dL (ref 8.4–10.5)
GFR calc Af Amer: >60 mL/min (ref >60)
GFR calc non Af Amer: >60 mL/min (ref >60)
Potassium: 3.4 mEq/L — ABNORMAL LOW (ref 3.5–5.1)
Sodium: 119 mEq/L — CL (ref 135–145)

## 2010-08-24 LAB — DIFFERENTIAL
Basophils Absolute: 0 10*3/uL (ref 0.0–0.1)
Basophils Relative: 0 % (ref 0–1)
Eosinophils Absolute: 0 10*3/uL (ref 0.0–0.7)
Eosinophils Relative: 0 % (ref 0–5)
Monocytes Absolute: 0.8 10*3/uL (ref 0.1–1.0)

## 2010-08-24 LAB — CBC
MCH: 25.4 pg — ABNORMAL LOW (ref 26.0–34.0)
MCHC: 33.8 g/dL (ref 30.0–36.0)
RDW: 19.2 % — ABNORMAL HIGH (ref 11.5–15.5)

## 2010-08-24 LAB — CARDIAC PANEL(CRET KIN+CKTOT+MB+TROPI)
CK, MB: 7 ng/mL (ref 0.3–4.0)
Troponin I: <0.3 ng/mL (ref <0.30)

## 2010-08-25 DIAGNOSIS — I517 Cardiomegaly: Secondary | ICD-10-CM

## 2010-08-25 LAB — APTT: aPTT: 55 seconds — ABNORMAL HIGH (ref 24–37)

## 2010-08-25 LAB — BASIC METABOLIC PANEL
CO2: 17 mEq/L — ABNORMAL LOW (ref 19–32)
Calcium: 8.4 mg/dL (ref 8.4–10.5)
Chloride: 96 mEq/L (ref 96–112)
Creatinine, Ser: 0.55 mg/dL (ref 0.50–1.10)
Glucose, Bld: 54 mg/dL — ABNORMAL LOW (ref 70–99)

## 2010-08-25 LAB — COMPREHENSIVE METABOLIC PANEL
ALT: 5 U/L (ref 0–35)
Alkaline Phosphatase: 51 U/L (ref 39–117)
BUN: 4 mg/dL — ABNORMAL LOW (ref 6–23)
CO2: 19 mEq/L (ref 19–32)
Chloride: 93 mEq/L — ABNORMAL LOW (ref 96–112)
GFR calc Af Amer: >60 mL/min (ref >60)
GFR calc non Af Amer: >60 mL/min (ref >60)
Glucose, Bld: 66 mg/dL — ABNORMAL LOW (ref 70–99)
Potassium: 2.6 mEq/L — CL (ref 3.5–5.1)
Sodium: 126 mEq/L — ABNORMAL LOW (ref 135–145)
Total Bilirubin: 0.4 mg/dL (ref 0.3–1.2)
Total Protein: 5.9 g/dL — ABNORMAL LOW (ref 6.0–8.3)

## 2010-08-25 LAB — DIFFERENTIAL
Basophils Absolute: 0 10*3/uL (ref 0.0–0.1)
Basophils Relative: 0 % (ref 0–1)
Eosinophils Relative: 0 % (ref 0–5)
Lymphocytes Relative: 18 % (ref 12–46)
Monocytes Absolute: 0.9 10*3/uL (ref 0.1–1.0)
Monocytes Relative: 19 % — ABNORMAL HIGH (ref 3–12)

## 2010-08-25 LAB — CBC
HCT: 27.8 % — ABNORMAL LOW (ref 36.0–46.0)
Hemoglobin: 9.4 g/dL — ABNORMAL LOW (ref 12.0–15.0)
MCH: 25.5 pg — ABNORMAL LOW (ref 26.0–34.0)
MCHC: 33.8 g/dL (ref 30.0–36.0)
RDW: 19 % — ABNORMAL HIGH (ref 11.5–15.5)

## 2010-08-25 LAB — CARDIAC PANEL(CRET KIN+CKTOT+MB+TROPI)
CK, MB: 5.7 ng/mL — ABNORMAL HIGH (ref 0.3–4.0)
Relative Index: INVALID (ref 0.0–2.5)
Troponin I: <0.3 ng/mL (ref <0.30)

## 2010-08-25 LAB — MAGNESIUM: Magnesium: 1.7 mg/dL (ref 1.5–2.5)

## 2010-08-26 DIAGNOSIS — F4320 Adjustment disorder, unspecified: Secondary | ICD-10-CM

## 2010-08-26 LAB — GLUCOSE, CAPILLARY: Glucose-Capillary: 147 mg/dL — ABNORMAL HIGH (ref 70–99)

## 2010-08-26 LAB — RENAL FUNCTION PANEL
Albumin: 3.2 g/dL — ABNORMAL LOW (ref 3.5–5.2)
BUN: <3 mg/dL — ABNORMAL LOW (ref 6–23)
Chloride: 98 mEq/L (ref 96–112)
Creatinine, Ser: 0.55 mg/dL (ref 0.50–1.10)
Glucose, Bld: 49 mg/dL — ABNORMAL LOW (ref 70–99)

## 2010-08-26 LAB — CBC
HCT: 32.5 % — ABNORMAL LOW (ref 36.0–46.0)
Hemoglobin: 10.7 g/dL — ABNORMAL LOW (ref 12.0–15.0)
MCHC: 32.9 g/dL (ref 30.0–36.0)
MCV: 77.2 fL — ABNORMAL LOW (ref 78.0–100.0)
RDW: 20.3 % — ABNORMAL HIGH (ref 11.5–15.5)
WBC: 4.8 10*3/uL (ref 4.0–10.5)

## 2010-08-26 LAB — MAGNESIUM: Magnesium: 1.8 mg/dL (ref 1.5–2.5)

## 2010-08-27 LAB — BASIC METABOLIC PANEL
CO2: 23 mEq/L (ref 19–32)
Chloride: 100 mEq/L (ref 96–112)
Creatinine, Ser: 0.5 mg/dL (ref 0.50–1.10)
GFR calc Af Amer: >60 mL/min (ref >60)
Potassium: 3.3 mEq/L — ABNORMAL LOW (ref 3.5–5.1)

## 2010-08-28 LAB — RENAL FUNCTION PANEL
Albumin: 2.7 g/dL — ABNORMAL LOW (ref 3.5–5.2)
BUN: <3 mg/dL — ABNORMAL LOW (ref 6–23)
CO2: 21 mEq/L (ref 19–32)
Chloride: 96 mEq/L (ref 96–112)
Creatinine, Ser: <0.47 mg/dL — ABNORMAL LOW (ref 0.50–1.10)
Glucose, Bld: 63 mg/dL — ABNORMAL LOW (ref 70–99)
Potassium: 3.7 mEq/L (ref 3.5–5.1)

## 2010-08-28 LAB — CBC
HCT: 30.2 % — ABNORMAL LOW (ref 36.0–46.0)
Hemoglobin: 9.9 g/dL — ABNORMAL LOW (ref 12.0–15.0)
MCV: 76.6 fL — ABNORMAL LOW (ref 78.0–100.0)
RBC: 3.94 MIL/uL (ref 3.87–5.11)
RDW: 20.6 % — ABNORMAL HIGH (ref 11.5–15.5)
WBC: 3.4 10*3/uL — ABNORMAL LOW (ref 4.0–10.5)

## 2010-08-29 NOTE — Consult Note (Signed)
  NAMECAROLIN, QUANG NO.:  [id number]  MEDICAL RECORD NO.:  [id number]  LOCATION:  1432                         FACILITY:  [facility]  PHYSICIAN:  Eulogio Ditch, MD DATE OF BIRTH:  November 23, 1930  DATE OF CONSULTATION:  08/26/2010 DATE OF DISCHARGE:                                CONSULTATION   REASON FOR CONSULTATION:  Capacity evaluation.  HISTORY OF PRESENT ILLNESS:  This 77 year old female who was admitted on the medical floor because of intractable nausea, vomiting, hyponatremia, and hypokalemia.  The patient reported that for the last 2 to 3 weeks she had no appetite and she has nausea all the time and she started vomiting a couple of weeks ago and it is getting worse.  The patient reported that her doctor Ramon Dredge was going to do the endoscopy, but her sodium and potassium were normal, so that is why he sent her to the ER.  The patient denied any past psych hospitalization.  Denies any drug abuse.  She lives with husband and she denied any conflict with him. The patient has a history of depression and is on Cymbalta and Remeron.  PAST MEDICAL HISTORY:  History of hyponatremia, polymyalgia rheumatica, chronic pain, metabolic acidosis, irritable bowel syndrome, colon polyps.  MENTAL STATUS EXAMINATION:  The patient was calm, cooperative during the interview, was irritable at certain times during the interview when I told her that I came to see whether she can make her own medical decisions or not.  The patient, at certain times, was asking me to repeat questions but she was able to listen to me properly and able to answer my questions and was very easily redirectable during the interview.  The patient was concerned about her nausea and was asking like how long she will be in the hospital.  The patient denied any suicidal or homicidal ideations.  Denied hearing any voices. Does not seem to be internally preoccupied, but is stressed as she is in  the hospital.  She is alert, awake, oriented x3.  Memory immediate, recent, remote fair, attention concentration fair.  Abstraction ability fair. Insight and judgment intact.  DIAGNOSES:  Axis I:  Depressive disorder, not otherwise specified. Axis II:  Deferred. Axis III:  See medical notes. Axis IV:  Intractable nausea. Axis V:  50.  RECOMMENDATIONS: 1. At this time, the patient can be continued on Cymbalta and Remeron. 2. The patient has capacity at this time to make her decisions. 3. I will follow up as needed on this patient.     Eulogio Ditch, MD     SA/MEDQ  D:  08/26/2010  T:  08/26/2010  Job:  [id number]  Electronically Signed by Eulogio Ditch  on 08/29/2010 09:51:53 AM

## 2010-09-08 ENCOUNTER — Inpatient Hospital Stay (HOSPITAL_COMMUNITY)
Admission: EM | Admit: 2010-09-08 | Discharge: 2010-09-16 | DRG: 392 | Disposition: A | Discharge status: Home / Self Care | Payer: Medicare Other | Attending: Internal Medicine | Admitting: Internal Medicine

## 2010-09-08 DIAGNOSIS — K2970 Gastritis, unspecified, without bleeding: Secondary | ICD-10-CM | POA: Diagnosis present

## 2010-09-08 DIAGNOSIS — Z800 Family history of malignant neoplasm of digestive organs: Secondary | ICD-10-CM

## 2010-09-08 DIAGNOSIS — Z79899 Other long term (current) drug therapy: Secondary | ICD-10-CM

## 2010-09-08 DIAGNOSIS — I10 Essential (primary) hypertension: Secondary | ICD-10-CM | POA: Diagnosis present

## 2010-09-08 DIAGNOSIS — E872 Acidosis, unspecified: Secondary | ICD-10-CM | POA: Diagnosis present

## 2010-09-08 DIAGNOSIS — K2990 Gastroduodenitis, unspecified, without bleeding: Secondary | ICD-10-CM | POA: Diagnosis present

## 2010-09-08 DIAGNOSIS — K589 Irritable bowel syndrome without diarrhea: Secondary | ICD-10-CM | POA: Diagnosis present

## 2010-09-08 DIAGNOSIS — K219 Gastro-esophageal reflux disease without esophagitis: Secondary | ICD-10-CM | POA: Diagnosis present

## 2010-09-08 DIAGNOSIS — E46 Unspecified protein-calorie malnutrition: Secondary | ICD-10-CM | POA: Diagnosis present

## 2010-09-08 DIAGNOSIS — Z8701 Personal history of pneumonia (recurrent): Secondary | ICD-10-CM

## 2010-09-08 DIAGNOSIS — K222 Esophageal obstruction: Principal | ICD-10-CM | POA: Diagnosis present

## 2010-09-08 DIAGNOSIS — I479 Paroxysmal tachycardia, unspecified: Secondary | ICD-10-CM | POA: Diagnosis present

## 2010-09-08 DIAGNOSIS — G8929 Other chronic pain: Secondary | ICD-10-CM | POA: Diagnosis present

## 2010-09-08 DIAGNOSIS — Z860100 Personal history of colon polyps, unspecified: Secondary | ICD-10-CM

## 2010-09-08 DIAGNOSIS — M353 Polymyalgia rheumatica: Secondary | ICD-10-CM | POA: Diagnosis present

## 2010-09-08 DIAGNOSIS — F329 Major depressive disorder, single episode, unspecified: Secondary | ICD-10-CM | POA: Diagnosis present

## 2010-09-08 DIAGNOSIS — F3289 Other specified depressive episodes: Secondary | ICD-10-CM | POA: Diagnosis present

## 2010-09-08 DIAGNOSIS — I498 Other specified cardiac arrhythmias: Secondary | ICD-10-CM | POA: Diagnosis present

## 2010-09-08 DIAGNOSIS — E871 Hypo-osmolality and hyponatremia: Secondary | ICD-10-CM | POA: Diagnosis present

## 2010-09-08 DIAGNOSIS — E876 Hypokalemia: Secondary | ICD-10-CM | POA: Diagnosis not present

## 2010-09-08 LAB — URINALYSIS, ROUTINE W REFLEX MICROSCOPIC
Hgb urine dipstick: NEGATIVE
Specific Gravity, Urine: 1.012 (ref 1.005–1.030)
Urobilinogen, UA: 1 mg/dL (ref 0.0–1.0)
pH: 6.5 (ref 5.0–8.0)

## 2010-09-08 LAB — PHOSPHORUS: Phosphorus: 2.3 mg/dL (ref 2.3–4.6)

## 2010-09-08 LAB — CBC
Hemoglobin: 13.5 g/dL (ref 12.0–15.0)
Platelets: 358 10*3/uL (ref 150–400)
RBC: 5.14 MIL/uL — ABNORMAL HIGH (ref 3.87–5.11)
WBC: 4.8 10*3/uL (ref 4.0–10.5)

## 2010-09-08 LAB — BASIC METABOLIC PANEL
BUN: 5 mg/dL — ABNORMAL LOW (ref 6–23)
CO2: 13 mEq/L — ABNORMAL LOW (ref 19–32)
Chloride: 84 mEq/L — ABNORMAL LOW (ref 96–112)
GFR calc non Af Amer: >60 mL/min (ref >60)
Glucose, Bld: 67 mg/dL — ABNORMAL LOW (ref 70–99)
Potassium: 4.1 mEq/L (ref 3.5–5.1)
Sodium: 123 mEq/L — ABNORMAL LOW (ref 135–145)

## 2010-09-09 LAB — BASIC METABOLIC PANEL
Chloride: 96 mEq/L (ref 96–112)
GFR calc Af Amer: >60 mL/min (ref >60)
GFR calc non Af Amer: >60 mL/min (ref >60)
Potassium: 3.5 mEq/L (ref 3.5–5.1)
Sodium: 129 mEq/L — ABNORMAL LOW (ref 135–145)

## 2010-09-09 LAB — DIFFERENTIAL
Basophils Absolute: 0 10*3/uL (ref 0.0–0.1)
Basophils Relative: 0 % (ref 0–1)
Eosinophils Absolute: 0 10*3/uL (ref 0.0–0.7)
Lymphocytes Relative: 20 % (ref 12–46)
Monocytes Relative: 9 % (ref 3–12)
Neutrophils Relative %: 71 % (ref 43–77)

## 2010-09-09 NOTE — H&P (Signed)
NAMELAYALI, [name] NO.:  [id number]  MEDICAL RECORD NO.:  [id number]  LOCATION:  WLED                         FACILITY:  [facility]  PHYSICIAN:  Gery Pray, MD      DATE OF BIRTH:  [birth year]  DATE OF ADMISSION:  09/08/2010 DATE OF DISCHARGE:                             HISTORY & PHYSICAL   PRIMARY CARE PHYSICIAN:  Hessie Diener (C.Alan) Tenny Craw, MD, with [facility].  GASTROENTEROLOGIST:  Llana Aliment. Randa Evens, MD  CODE STATUS:  Full code.  Team #5.  CHIEF COMPLAINT:  Nausea and vomiting.  HISTORY OF PRESENT ILLNESS:  This is a 77 year old female who was recently admitted and discharged last month with nausea and vomiting. at that time she c/o c/o nausea and vomiting lasting  5weeks months. No hematemesis.  She has also had some intermittent diarrhea.  No melena.  No evidence of bleeding.  She was scheduled for an outpatient EGD and colonoscopy with Dr. Randa Evens, however, blood work revealed hyponatremia and hypokalemia.  She was sent in for admission.  The patient was on hydrochlorothiazide.  This was discontinued.  Her sodium improved, she was discharged home.  Since she has been discharged home, she states that her nausea and vomiting has continued.  She continued to have poor appetite as a result and has continued to get weaker.  Per her husband, the patient was ambulating and doing her own ADLs prior to her nausea and vomiting.  Now, the patient is weak and mostly bed-bound.  He states he has to do most things for her including placing her on the bedpan.  She saw her PCP, Dr. Tenny Craw, today at her routine office followup.  He sent her back to the ER to be evaluated and for possibly obtaining an EGD and a colonoscopy as an inpatient.  Lab work here revealed the patient is again hyponatremic.  History obtained from the patient and from her husband who is present at the bedside. The patient reports no fever, no chills.  She does report some abdominal pain in  the right upper quadrant and in the left upper quadrant.  She does have some retching when she vomits.  Per husband, she vomits probably 2-3 times a day.  I am unable to get a proper detail on her diarrhea.  She seems to have some diarrhea with intermittent constipation.  Per husband, the patient has lost approximately 20 pounds over the last 2 months since her nausea and vomiting has been going on. The patient is on chronic pain medications which she continues to take as scheduled.  REVIEW OF SYSTEMS:  As noted in HPI.  All 10-point systems reviewed and otherwise negative.  PAST MEDICAL HISTORY: 1. Hyponatremia. 2. Polymyalgia rheumatica. 3. Chronic pain. 4. Irritable bowel. 5. Depression. 6. Colon polyps. 7. On her last admission, she also had an episode of AFib.  PAST SURGICAL HISTORY:  Cholecystectomy and right lung lobectomy for pneumonia.  MEDICATIONS:  Amitiza, hydrocodone, potassium chloride, Protonix, Senokot, and Valium.  ALLERGIES:  No known drug allergies.  SOCIAL HISTORY:  Negative for tobacco, alcohol, or illicit drugs.  She lives with her husband who is present at the bedside.  She has  no home oxygen  currently.  She seldom ambulates.  FAMILY HISTORY:  Significant for colon cancer in her father.  PHYSICAL EXAMINATION:  VITAL SIGNS:  Initial blood pressure 183/82 and most recent was 129/66, pulse 86, respirations 16, temperature 98.2. GENERAL:  Alert, oriented female. EYES:  Pink conjunctivae.  PERRLA. ENT:  Moist oral mucosa.  Trachea midline. NECK:  Supple. LUNGS:  Clear to auscultation bilaterally.  No use of accessory muscles. CARDIOVASCULAR:  Regular rate and rhythm without murmurs, rubs, or gallops.  No JVD. ABDOMEN:  Soft, positive bowel sounds, nontender, nondistended.  No significant tenderness elicited.  No evidence of ascites. SKIN:  No rashes.  No subcutaneous crepitation. NEUROLOGIC:  Cranial nerves II through XII grossly intact.   Sensation intact.   MUSCULOSKELETAL: Strength is normal at 5/5 in all extremities.  LABORATORY DATA:  White blood count 4.8, hemoglobin 13.5, platelets 358. Phosphorus 2.3, sodium 123, potassium 4.1, chloride 8.4, CO2 of 13, glucose 67, BUN 5, creatinine 0.59.  UA is negative.  ASSESSMENT AND PLAN: 1. Hyponatremia.  The patient will be admitted.  We will again check     the patient's urine sodium and osmolarity, and plasma osmolarity.     We will place the patient on IV fluids while these are being     resulted.  Hyponatremia likely due to the patient's ongoing nausea     and vomiting.  On her last admission, she was on     hydrochlorothiazide that was discontinued. 2. Intractable nausea and vomiting. 3. Weight loss. 4. Family history of colon cancer.  We will order p.r.n. antiemetic as     well as GI prophylaxis.  We will place the patient on clear liquid     diet.  We will defer to a.m. team to consult GI.  Dr. Randa Evens is     her gastroenterologist. 5. Polymyalgia rheumatica. 6. Chronic pain.  We will resume the patient's home medication. 7. The patient had atrial fibrillation on her last visit, however,     that seems to have resolved.  We will order EKG for evaluation.  On     my examination, the patient does sound to be in sinus rhythm.          ______________________________ Gery Pray, MD     DC/MEDQ  D:  09/09/2010  T:  09/09/2010  Job:  [id number]  Electronically Signed by Gery Pray MD on 09/09/2010 06:33:04 AM

## 2010-09-10 LAB — TSH: TSH: 1.43 u[IU]/mL (ref 0.350–4.500)

## 2010-09-10 LAB — PREALBUMIN: Prealbumin: 13 mg/dL — ABNORMAL LOW (ref 17.0–34.0)

## 2010-09-10 LAB — BASIC METABOLIC PANEL
Chloride: 99 mEq/L (ref 96–112)
GFR calc Af Amer: >60 mL/min (ref >60)
Potassium: 3.1 mEq/L — ABNORMAL LOW (ref 3.5–5.1)
Sodium: 132 mEq/L — ABNORMAL LOW (ref 135–145)

## 2010-09-10 LAB — CORTISOL: Cortisol, Plasma: 20.4 ug/dL

## 2010-09-10 LAB — CEA: CEA: 4.7 ng/mL (ref 0.0–5.0)

## 2010-09-11 LAB — OSMOLALITY: Osmolality: 272 mOsm/kg — ABNORMAL LOW (ref 275–300)

## 2010-09-11 LAB — BASIC METABOLIC PANEL
CO2: 20 mEq/L (ref 19–32)
Calcium: 9 mg/dL (ref 8.4–10.5)
Creatinine, Ser: 0.51 mg/dL (ref 0.50–1.10)
GFR calc Af Amer: >60 mL/min (ref >60)
GFR calc non Af Amer: >60 mL/min (ref >60)

## 2010-09-13 ENCOUNTER — Other Ambulatory Visit: Payer: Self-pay | Admitting: Gastroenterology

## 2010-09-13 LAB — BASIC METABOLIC PANEL
BUN: <3 mg/dL — ABNORMAL LOW (ref 6–23)
CO2: 26 mEq/L (ref 19–32)
Calcium: 9.1 mg/dL (ref 8.4–10.5)
Chloride: 95 mEq/L — ABNORMAL LOW (ref 96–112)
Chloride: 97 mEq/L (ref 96–112)
Creatinine, Ser: 0.54 mg/dL (ref 0.50–1.10)
Creatinine, Ser: 0.57 mg/dL (ref 0.50–1.10)
GFR calc Af Amer: >60 mL/min (ref >60)
GFR calc Af Amer: >60 mL/min (ref >60)
GFR calc non Af Amer: >60 mL/min (ref >60)
Potassium: 3.3 mEq/L — ABNORMAL LOW (ref 3.5–5.1)
Sodium: 132 mEq/L — ABNORMAL LOW (ref 135–145)

## 2010-09-14 LAB — CARDIAC PANEL(CRET KIN+CKTOT+MB+TROPI)
CK, MB: 2.6 ng/mL (ref 0.3–4.0)
Relative Index: INVALID (ref 0.0–2.5)
Relative Index: INVALID (ref 0.0–2.5)
Total CK: 37 U/L (ref 7–177)
Troponin I: <0.3 ng/mL (ref <0.30)

## 2010-09-14 LAB — BASIC METABOLIC PANEL
BUN: 5 mg/dL — ABNORMAL LOW (ref 6–23)
CO2: 24 mEq/L (ref 19–32)
Chloride: 100 mEq/L (ref 96–112)
Glucose, Bld: 95 mg/dL (ref 70–99)
Potassium: 4.5 mEq/L (ref 3.5–5.1)
Sodium: 133 mEq/L — ABNORMAL LOW (ref 135–145)

## 2010-09-14 LAB — MAGNESIUM: Magnesium: 1.8 mg/dL (ref 1.5–2.5)

## 2010-09-14 LAB — CBC
HCT: 33.2 % — ABNORMAL LOW (ref 36.0–46.0)
Hemoglobin: 10.9 g/dL — ABNORMAL LOW (ref 12.0–15.0)
MCH: 25.7 pg — ABNORMAL LOW (ref 26.0–34.0)
MCHC: 32.8 g/dL (ref 30.0–36.0)
RDW: 23.4 % — ABNORMAL HIGH (ref 11.5–15.5)

## 2010-09-15 LAB — COMPREHENSIVE METABOLIC PANEL
ALT: 5 U/L (ref 0–35)
AST: 20 U/L (ref 0–37)
Albumin: 2.7 g/dL — ABNORMAL LOW (ref 3.5–5.2)
Alkaline Phosphatase: 50 U/L (ref 39–117)
BUN: 5 mg/dL — ABNORMAL LOW (ref 6–23)
Chloride: 98 mEq/L (ref 96–112)
Potassium: 4.1 mEq/L (ref 3.5–5.1)
Sodium: 130 mEq/L — ABNORMAL LOW (ref 135–145)
Total Protein: 6 g/dL (ref 6.0–8.3)

## 2010-09-15 LAB — MAGNESIUM: Magnesium: 2.1 mg/dL (ref 1.5–2.5)

## 2010-09-15 NOTE — Consult Note (Signed)
[name], [name] NO.:  [id number]  MEDICAL RECORD NO.:  [id number]  LOCATION:  1539                         FACILITY:  [facility]  PHYSICIAN:  Willis Modena, MD     DATE OF BIRTH:  1930/05/08  DATE OF CONSULTATION:  09/09/2010 DATE OF DISCHARGE:                                CONSULTATION   REASON FOR CONSULTATION:  Nausea, vomiting, weight loss.  CHIEF COMPLAINT:  Nausea, vomiting.  HISTORY OF PRESENT ILLNESS:  [name] is a 77 year old female with multiple medical problems including polymyalgia rheumatica and chronic pain on chronic pain medication.  She also carries a history of significant depression.  She is followed clinically by Dr. Donovan Kail. History is a bit challenging to obtain from the patient.  However, upon discussion with the patient as well as her husband, [name], in detail of her health, it appears that she was in a fairly static state of GI health until about 3 weeks ago.  At that time, she developed a poor appetite on food, she would try to eat, had a poor taste.  Her husband tells me that for the past 3 weeks she essentially has not eaten anything by mouth and the only thing she has consumed by mouth is water. With intake of water, she has fairly rapid onset of vomiting.  This vomiting sounds more compatible with aggressive regurgitation than frank retching.  No obvious hematemesis, melena, or hematochezia.  No obvious dysphagia.  She endorses some vague bilateral upper abdominal discomfort as well as lower abdominal discomfort.  She is apparently had these discomfort for a while now and endorses that some of these do improve with effective defecation.  Her defecation has been chronically constipated for which she has been on lubiprostone.  She has had a couple admissions for these episodes of nausea, vomiting which have been in the setting of hyponatremia and hypokalemia.  She had been on diuretics which has subsequently been  stopped.  She has had no further organic GI tract evaluation as far as I can tell.  She is postcholecystectomy many years ago.  She describes having had an endoscopy and colonoscopy several years ago by Dr. Jarold Motto at [facility]. She has apparently lost about 30 pounds over the past few weeks in the setting of no eating.  Past medical history, past surgical history, home medications, allergies, family history, social history, review of systems, all from dictated note from Dr. Haroldine Laws, dictated September 09, 2010.  I have reviewed and I agreed.  PHYSICAL EXAMINATION:  VITAL SIGNS:  Blood pressure 160/78, heart rate 86, respiratory rate 22, temperature 97.9, oxygen saturation 100% on room air. GENERAL:  [name] is hard of hearing.  She appears dehydrated, but not acutely ill. HEENT:  Dry mucous membranes.  No oropharyngeal lesions. EYES:  Sclerae anicteric.  Conjunctivae pink. NECK:  Supple. LUNGS:  Clear. HEART:  Regular. ABDOMEN:  Soft, mild nonspecific generalized abdominal discomfort.  No obvious distention or tympany.  No liver or splenic enlargement.  No bulging flanks to suggest ascites.  Active bowel sounds. EXTREMITIES:  No peripheral cyanosis, clubbing, or edema. NEUROLOGIC:  Diffusely weak, nonfocal without lateralizing signs. LYMPHATICS:  No palpable  axillary, submandibular, supraclavicular adenopathy.  RADIOLOGIC STUDY:  Abdominal x-ray done sometime ago showed no free air, nonobstructive bowel gas pattern.  No imaging studies, otherwise has essentially been done for some time now.  LABORATORY STUDIES:  On admission, her sodium was 123, currently 129; potassium 3.5; chloride 96; bicarb 60; BUN 4; creatinine 0.53.  her urinalysis shows moderate hyperbilirubinemia.  She has a fair bit of ketones.  White count 4.8, hemoglobin 13.5, platelet count is 358.  IMPRESSION:  [name] is a 77 year old female presenting with ill- defined chronic pain syndrome superimposed on  more acute anorexia, weight loss, nausea, and vomiting.  Certainly, the underlying process of work here is difficult to discern, however, I think we should commence an evaluation for an organic process at the route of these symptoms.  PLAN: 1. Continue supportive management with IV fluids and antiemetic     therapy. 2. We would continue to hold lubiprostone which can exacerbate her     chronic nausea. 3. Check random cortisol and TSH and inflammatory markers. 4. Pursue upper endoscopy tomorrow. 5. If upper endoscopy is negative, one could consider a CT scan of the     chest, abdomen, and pelvis to evaluate for her anorexia, weight     loss, generalized pain, and vomiting. 6. If CT scan is unrevealing, one could consider gastric emptying     study to rule out gastroparesis in the setting of chronic narcotic     therapy. 7. If all of these are unrevealing, one can consider a CT or MRI of     the brain to evaluate some kind of central nervous system process     contributing to her symptomatology. 8. We will follow along with you.  Thank you for involving and participate in [name] care.  I spent greater than 45 minutes discussing case with the patient, husband, and counselling and coordinating her care.     Willis Modena, MD     WO/MEDQ  D:  09/09/2010  T:  09/10/2010  Job:  [id number]  Electronically Signed by Willis Modena  on 09/15/2010 07:25:33 PM

## 2010-09-16 LAB — BASIC METABOLIC PANEL
BUN: 7 mg/dL (ref 6–23)
Chloride: 99 mEq/L (ref 96–112)
Creatinine, Ser: 0.59 mg/dL (ref 0.50–1.10)
GFR calc non Af Amer: >60 mL/min (ref >60)
Glucose, Bld: 103 mg/dL — ABNORMAL HIGH (ref 70–99)
Potassium: 4.1 mEq/L (ref 3.5–5.1)

## 2010-09-16 LAB — CBC
HCT: 32.6 % — ABNORMAL LOW (ref 36.0–46.0)
Hemoglobin: 10.9 g/dL — ABNORMAL LOW (ref 12.0–15.0)
MCHC: 33.4 g/dL (ref 30.0–36.0)
MCV: 79.5 fL (ref 78.0–100.0)

## 2010-09-16 NOTE — Discharge Summary (Signed)
[name], [name] NO.:  [id number]  MEDICAL RECORD NO.:  [id number]  LOCATION:  1432                         FACILITY:  [facility]  PHYSICIAN:  [name], M.D. DATE OF BIRTH:  09-28-1930  DATE OF ADMISSION:  08/24/2010 DATE OF DISCHARGE:  08/28/2010                              DISCHARGE SUMMARY   PRIMARY CARE PHYSICIAN:  [name].  GI PHYSICIAN:  [name].  DISCHARGE DIAGNOSES: 1. 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 2. Chronic nausea and vomiting, question psychiatric component, see     below for details. 3. Polymyalgia rheumatica. 4. Irritable bowel syndrome. 5. History of depression.  DISCHARGE MEDICATIONS: 1. Megace 800 mg by mouth daily. 2. Prednisone 20 mg daily. 3. Sodium phos packets to take 1 packet three times a day for 5 days. 4. K-Dur 20 mEq daily. 5. Vicodin 10/325 mg 1 tablet four times a day. 6. Protonix 40 mg daily. 7. Remeron 15 mg daily. 8. Toprol XL 25 mg daily. 9. Tylenol 500 mg every 6 hours as needed for pain. 10.Valium 10 mg at bedtime as needed for anxiety. 11.Zofran 4 mg three times a day as needed for nausea.  She has been     instructed to discontinue use of her hydrochlorothiazide.  DISPOSITION AND FOLLOWUP:  [name] will be discharged home today in improved condition.  I believe that she definitely warrants outpatient psychiatric followup and this should be arranged through her primary care physician.  CONSULTATIONS THIS HOSPITALIZATION:  None.  IMAGES AND PROCEDURES:  None.  HISTORY AND PHYSICAL:  For full details, please refer to dictation on August 24, 2010, by [name] but in brief [name] is a 77 year old Caucasian lady who has a history of nausea and vomiting who according to her husband drinks about 2 to 3 gallons of water a day.  She was scheduled  to have an endoscopy and had some blood work prior to this and [name] send her to the hospital because of electrolyte deficiencies.  HOSPITAL COURSE BY PROBLEM: 1. Severe electrolyte abnormalities.  On admission, she was found to     have a sodium of 119, potassium of 3 and a phosphorus level of 0.9.     At time of discharge, her sodium is 132, potassium is 3.7 and her     phosphorous is 1.9.  She has been discharged with potassium     chloride as well as sodium phos packets.  She has been instructed     to discontinue use of her hydrochlorothiazide which could certainly     contribute to hyponatremia, although I believe that most of her     electrolyte abnormalities are due to a combination of anorexia and     possibly psychogenic polydipsia, please see below for details. 2. Chronic nausea and vomiting.  What is interesting at this point is     that throughout the patient's hospitalization, she has refused     meals and medications because of quoted nausea, however, she has     always been asking for  her Vicodin at exactly the same time it is     scheduled.  She has not had any episodes of nausea or vomiting with     her pain medication or her antianxiety medication.  At one point,     we decided to do strict Is and Os with [name] and she became     furious because we had taken her water pitcher out of the room.  She     threatened to leave AMA.  At this point, a psychiatric consult was     obtained.  We deemed that she did have medical capacity to make her     own decisions.  The patient's husband [name] was able to calm her     down and she stayed an extra day which afforded [place] more time to     replete her electrolytes.  Today the patient is adamant that she     will be leaving the hospital unless she receives her water     pitcher back.  I do think she has some component of psychogenic     polydipsia, however, unfortunately her urine osmolality was not     ordered on  admission and after receiving several liters of IV     fluids would not have been the most appropriate time to do this.  I     do think that her hyponatremia is mostly dilutional because of her     large amount of water intake.  She does have an EGD scheduled with     [name] to further evaluate her nausea, although I doubt this     will have any findings.  3.Depression:  We would like her to continue on Remeron and Cymbalta as have been the psychiatric recommendations this admission, however, as stated above she refuses to take all medications with the exception of her pain medicine.  Rest of chronic conditions are stable.  Vitals on day of discharge, blood pressure 142/79 heart rate 82, respirations 18, sats 98% on room air and temperature of 98.6.     [name], M.D.     EH/MEDQ  D:  08/28/2010  T:  08/28/2010  Job:  [id number]  cc:   [name]., M.D. Fax: [phone]  Electronically Signed by [name] M.D. on 09/16/2010 05:17:55 PM

## 2010-09-19 NOTE — Consult Note (Signed)
NAMEMARSELA, [name] NO.:  [id number]  MEDICAL RECORD NO.:  [id number]  LOCATION:  1418                         FACILITY:  [facility]  PHYSICIAN:  Jake Bathe, MD      DATE OF BIRTH:  10-13-1930  DATE OF CONSULTATION: DATE OF DISCHARGE:                                CONSULTATION   REASON FOR CONSULTATION:  Evaluation of atrial fibrillation at the request of Dr. Blake Divine.  HISTORY OF PRESENT ILLNESS:  A 77 year old female patient of Dr. Duane Lope and Dr. Carman Ching of [place] who is being seen for the evaluation of atrial fibrillation.  Upon close inspection of her EKG as well as telemetry, she does not have atrial fibrillation, but she does have what looks like a multifocal atrial rhythm.  T-waves are preceding each QRS complex and are from different origins of her atrium.  If this were tachycardia, this will be called multifocal atrial tachycardia, which is often seen in people with underlying lung disease.  Interestingly on her echocardiogram done on August 25, 2010, she did have elevated RV systolic pressures, which could be indicative of underlying mild-to-moderate pulmonary hypertension and underlying lung condition.  She did; however, have 6 beats of wide complex tachycardia with no clear fusion beat and no clear AV dissociation.  Certainly, there is a possibility for SVT with aberrancy; however, one cannot exclude 6 beats of nonsustained ventricular tachycardia.  Reassuringly her echocardiogram demonstrates normal ejection fraction with no other structural abnormalities except for mild RV dilatation.  She was admitted here on August 4th, secondary to nausea and vomiting with hyponatremia, hypokalemia, and polymyalgia rheumatica.  According to past medical history, she had an episode of atrial fibrillation on last admission.  I bet that this was actually multifocal atrial rhythm once again.  The previous EKG personally viewed was interpreted by  the computer is atrial fibrillation; however, it was multifocal atrial rhythm.  PAST MEDICAL HISTORY:  Hyponatremia, polymyalgia rheumatica, chronic pain, irritable bowel, depression, colonic polyps, and arrhythmia.  SURGICAL HISTORY:  Cholecystectomy and right lung lobectomy for pneumonia.  MEDICATIONS:  Amitiza, hydrocodone, potassium chloride, Protonix, Senokot, and Valium as an outpatient.  ALLERGIES:  No known drug allergies.  SOCIAL HISTORY:  No tobacco, alcohol, or drug use; she lives with her husband, seldom ambulates according to the history.  FAMILY HISTORY:  Significant for colon cancer in her father.  No early family history of coronary artery disease.  REVIEW OF SYSTEMS:  Unless specified above, all other 12 review of systems negative.  She denies any chest pain.  She denies any significant shortness of breath.  No fevers or chills.  She does not feel her palpitations.  Her wide complex tachycardia was asymptomatic.  PHYSICAL EXAMINATION:  VITAL SIGNS:  Temperature 98.8, pulse 78, respirations 16, satting 94% on 3 liters, and blood pressure 117/70. GENERAL:  She is alert, was sleeping, and easily arousable.  She did consistently asked me if I was the one who was asking her to wear the telemetry monitor and if she could take this off.  She also was fixated on the amount of pills that she was taking at home and asked me if  I was someone who gave her the 2 pills.  She did seem to be able to answer questions; however, responsibly. EYES:  Pale conjunctivae.  EOMI.  No scleral icterus. NECK:  Supple.  No lymphadenopathy.  No thyromegaly.  No carotid bruits. No JVD. CARDIOVASCULAR:  Irregularly irregular rhythm with soft systolic murmur heard at left lower sternal border.  Normal PMI. LUNGS:  No significant rhonchi or crackles.  Normal respiratory effort. ABDOMEN:  Soft, nontender, and normoactive bowel sounds.  No rebound or guarding.  No bruits. EXTREMITIES:  No  clubbing, cyanosis, or edema.  Normal distal pulses. SKIN:  Warm, dry, and intact.  No rashes are noted. GU:  Deferred. RECTAL:  Deferred. NEUROLOGIC:  Nonfocal.  No tremors.  DATA:  Magnesium today 2.1, sodium 130, potassium 4.1, BUN 5, creatinine 0.5, albumin 2.7, other LFTs normal.  Cardiac panels normal.  TSH is normal.  Abdomen 2-view shows no free air.  Chest x-ray shows chronic lung changes.  No pleural fluid.  There is left pleural scarring with mild elevation of left hemidiaphragm from pneumonectomy.  This is most likely her underlying lung disease.  EKG personally viewed was interpreted by the computer as atrial fibrillation; however this was not atrial fibrillation, this is multifocal atrial rhythm.  Nonspecific ST-T wave changes.  Echocardiogram as above.  ASSESSMENT/PLAN:  A 77 year old female with multifocal atrial rhythm, which is an irregularly irregular rhythm, no atrial fibrillation, nausea and vomiting being addressed by GI with hyponatremia, malnutrition, and brief nonsustained wide complex tachycardia likely ventricular tachycardia. 1. Multifocal atrial rhythm-once again this is not atrial for     fibrillation.  P-waves are clearly seen preceding each QRS complex     of different morphologies indicative of a different anatomical     location within the atrium.  Usually these types of rhythms were     seen with people with underlying lung conditions and when     tachycardic, this is called multifocal atrial tachycardia.  She     does have a prior pneumonectomy and she does have elevated right     ventricular systolic pressures indicative of elevated pulmonary     pressures hence underlying pulmonary disease.  Treatment for this     is beta blockers or calcium channel blockers.  I completely agree     with increasing her Toprol from 25 mg once a day up to 50 mg once a     day.  She should not have any adverse effects from this.  Her     echocardiogram already done  shows structurally normal left     ventricle and normal EF. 2. Ventricular tachycardia-this is nonsustained, increasing beta-     blocker I agree with.  Keep potassium and magnesium greater than 4     and 2 respectively.  TSH was normal.  LV is normal.  Right     ventricle does demonstrate mild dilatation as explained above.  No     further cardiac workup is warranted.  Hyponatremia, nausea,     vomiting secondary to esophageal stricture status post EGD     dilatation.  As stated above, no further cardiac workup is necessary.  No need for Cardiology followup unless condition change.  We will go ahead and sign off, but please feel free to call if any further questions or if needed.     Jake Bathe, MD     MCS/MEDQ  D:  09/15/2010  T:  09/16/2010  Job:  [id number]  cc:   Kathlen Mody, MD  Electronically Signed by Donato Schultz MD on 09/19/2010 06:43:29 AM

## 2010-09-29 NOTE — Discharge Summary (Signed)
[name], [name] NO.:  [id number]  MEDICAL RECORD NO.:  [id number]  LOCATION:  1418                         FACILITY:  [facility]  PHYSICIAN:  Kathlen Mody, MD       DATE OF BIRTH:  [birth year]  DATE OF ADMISSION:  09/08/2010 DATE OF DISCHARGE:  09/16/2010                              DISCHARGE SUMMARY   PRIMARY CARE PHYSICIAN:  Miguel Aschoff, M.D.  DISCHARGE DIAGNOSES: 1. Persistent nausea and vomiting. 2. Esophageal stricture. 3. Paroxysmal atrial tachycardia. 4. Severe hyponatremia. 5. Polymyalgia rheumatica. 6. Psychogenic polydipsia. 7. Chronic pain syndrome. 8. Irritable bowel syndrome. 9. Depression. 10.History of colon polyps. 11.History of azotemia.  DISCHARGE MEDICATIONS: 1. Ensure 237 mL 3 times a day. 2. Lactose-free nutritional supplement 240 mL as needed. 3. Nutritional supplement 240 mL as needed. 4. Protonix 40 mg twice daily. 5. Toprol 50 mg 1 tab daily. 6. Amitiza 8 mg 1 tab twice a day. 7. K-Lor 20 mEq 1 tab daily. 8. Norco 1 tab 4 times daily as needed. 9. Remeron 15 mg one tab daily at bedtime. 10.Senna 8.6 mg 1 tab daily as needed. 11.Tylenol 500 mg 1 tab q.6 hours as needed. 12.Valium 10 mg 1 tab daily at bedtime. 13.Zofran 4 mg 1 tab 3 times a day.  CONSULTATIONS CALLED: 1. Gastroenterology consult from Dr. Dulce Sellar. 2. Cardiology consult from [facility].  PROCEDURES DONE:  The patient had an upper endoscopy with endoscopic balloon dilatation and biopsy.  PERTINENT LABS:  On admission, the patient had urinalysis, which was negative for nitrites and leukocytes.  The basic metabolic panel showing sodium of 123, potassium of 4.1, chloride of 84, bicarb of 13, glucose of 67, and phosphorus 2.3.  CBC showed a hemoglobin of 13.5 and hematocrit of 39.5.  Urine osmolality was 252.  Prealbumin was 13. Carcinoembryonic antigen was 4.7.  Random cortisol level was 20.4.  TSH within normal limits.  Serum osmolality 272.  Cardiac  enzymes were negative.  On the day of discharge, the patient's basic metabolic panel showed a sodium of 131 and glucose of 103.  CBC showed a hemoglobin of 10.9 and hematocrit of 32.6.  DIAGNOSTIC STUDIES:  None.  BRIEF HOSPITAL COURSE:  This is a 77 year old lady who has been admitted multiple times for persistent nausea, vomiting without any hematemesis, came this admission for similar complaints of persistent nausea and vomiting.  She was scheduled for an upper endoscopy and colonoscopy with Dr. Randa Evens; however, her blood work revealed hyponatremia and hypokalemia and she was sent from doctor's office for further evaluation and treatment for hyponatremia and hypokalemia.  The patient was found to be on hydrochlorothiazide, this was discontinued and she was discharged home, but she had persistent symptoms and she was admitted by Hospitalist Service for further evaluation and treatment. 1. For persistent nausea and vomiting,  she was put on clear liquid     diet and Gastroenterology consult from Dr. Dulce Sellar was obtained. 2. Hyponatremia.  The patient was worked up for SIADH and she has a     history of psychogenic polydipsia.  She was put on fluid     restriction.  A urine sodium osmolality plus serum osmolality  was     obtained.  TSH was within normal limits.  Cortisol was within     normal limits, hence she was put on fluid restriction and her     sodium has improved.  Another reason for her hyponatremia could     have been secondary to hydrochlorothiazide and from persistent     nausea and vomiting. 3. Persistent nausea and vomiting.  Gastroenterology consult from Dr.     Dulce Sellar was obtained who recommended upper endoscopy.  The patient     had an upper endoscopy on September 13, 2010, with endoscopic balloon     dilatation and biopsy of the esophageal stricture.  After the     procedure the patient was able to tolerate diet and we slowly     advanced her diet from liquid diet to the  regular diet.  She was     also found to have diffuse gastritis.  Her Protonix was changed     from daily dosing to twice a day.  Over the next 48 hours, the patient developed paroxysmal atrial tachycardia, at which time, Cardiology consult from [facility] was obtained.  She also had an echocardiogram done about a month ago, which showed normal systolic function with grade 2 diastolic dysfunction.  As per Cardiology, the patient does not need any further workup.  Her beta- blocker has been increased from 25 mg to 50 mg of Toprol and she was recommended to keep her potassium levels within normal limits.  On the day of discharge, the patient's vitals included temperature of 97.6, pulse of 55, blood pressure 108/68, saturating 100% on room air. Her exam was within normal limits.  She was hemodynamically stable for discharge to home.  The patient and her husband refused PT/OT and home health PT.  Hence, they are being discharged home and recommended to follow with her PCP in 1-2 weeks as needed.          ______________________________ Kathlen Mody, MD     VA/MEDQ  D:  09/28/2010  T:  09/29/2010  Job:  [id number]  Electronically Signed by Kathlen Mody MD on 09/29/2010 12:37:02 PM

## 2010-10-07 ENCOUNTER — Inpatient Hospital Stay (HOSPITAL_COMMUNITY)
Admission: EM | Admit: 2010-10-07 | Discharge: 2010-10-10 | DRG: 641 | Disposition: A | Discharge status: Home / Self Care | Payer: Medicare Other | Attending: Internal Medicine | Admitting: Internal Medicine

## 2010-10-07 DIAGNOSIS — S72009A Fracture of unspecified part of neck of unspecified femur, initial encounter for closed fracture: Secondary | ICD-10-CM

## 2010-10-07 DIAGNOSIS — D649 Anemia, unspecified: Secondary | ICD-10-CM | POA: Diagnosis present

## 2010-10-07 DIAGNOSIS — M353 Polymyalgia rheumatica: Secondary | ICD-10-CM | POA: Diagnosis present

## 2010-10-07 DIAGNOSIS — I498 Other specified cardiac arrhythmias: Secondary | ICD-10-CM | POA: Diagnosis present

## 2010-10-07 DIAGNOSIS — R197 Diarrhea, unspecified: Secondary | ICD-10-CM | POA: Diagnosis present

## 2010-10-07 DIAGNOSIS — G894 Chronic pain syndrome: Secondary | ICD-10-CM | POA: Diagnosis present

## 2010-10-07 DIAGNOSIS — R634 Abnormal weight loss: Secondary | ICD-10-CM | POA: Diagnosis present

## 2010-10-07 DIAGNOSIS — E871 Hypo-osmolality and hyponatremia: Principal | ICD-10-CM | POA: Diagnosis present

## 2010-10-07 DIAGNOSIS — R339 Retention of urine, unspecified: Secondary | ICD-10-CM | POA: Diagnosis present

## 2010-10-07 DIAGNOSIS — F22 Delusional disorders: Secondary | ICD-10-CM | POA: Diagnosis present

## 2010-10-07 DIAGNOSIS — E876 Hypokalemia: Secondary | ICD-10-CM | POA: Diagnosis present

## 2010-10-07 DIAGNOSIS — R631 Polydipsia: Secondary | ICD-10-CM | POA: Diagnosis present

## 2010-10-07 DIAGNOSIS — R112 Nausea with vomiting, unspecified: Secondary | ICD-10-CM | POA: Diagnosis present

## 2010-10-07 HISTORY — DX: Fracture of unspecified part of neck of unspecified femur, initial encounter for closed fracture: S72.009A

## 2010-10-07 LAB — URINALYSIS, ROUTINE W REFLEX MICROSCOPIC
Bilirubin Urine: NEGATIVE
Glucose, UA: NEGATIVE mg/dL
Ketones, ur: NEGATIVE mg/dL
Leukocytes, UA: NEGATIVE
pH: 7 (ref 5.0–8.0)

## 2010-10-07 LAB — POCT I-STAT, CHEM 8
BUN: <3 mg/dL — ABNORMAL LOW (ref 6–23)
BUN: <3 mg/dL — ABNORMAL LOW (ref 6–23)
Calcium, Ion: 1.03 mmol/L — ABNORMAL LOW (ref 1.12–1.32)
Calcium, Ion: 1.12 mmol/L (ref 1.12–1.32)
Chloride: 90 mEq/L — ABNORMAL LOW (ref 96–112)
Chloride: 97 mEq/L (ref 96–112)
Glucose, Bld: 93 mg/dL (ref 70–99)

## 2010-10-07 LAB — CBC
Hemoglobin: 10.6 g/dL — ABNORMAL LOW (ref 12.0–15.0)
MCH: 27.9 pg (ref 26.0–34.0)
MCV: 84.1 fL (ref 78.0–100.0)
RBC: 3.84 MIL/uL — ABNORMAL LOW (ref 3.87–5.11)

## 2010-10-07 LAB — DIFFERENTIAL
Basophils Relative: 1 % (ref 0–1)
Eosinophils Relative: 0 % (ref 0–5)
Lymphocytes Relative: 44 % (ref 12–46)
Monocytes Relative: 17 % — ABNORMAL HIGH (ref 3–12)
Neutro Abs: 1.5 10*3/uL — ABNORMAL LOW (ref 1.7–7.7)

## 2010-10-08 LAB — CARDIAC PANEL(CRET KIN+CKTOT+MB+TROPI)
Relative Index: 4.3 — ABNORMAL HIGH (ref 0.0–2.5)
Total CK: 129 U/L (ref 7–177)
Troponin I: <0.3 ng/mL (ref <0.30)

## 2010-10-08 LAB — CK TOTAL AND CKMB (NOT AT ARMC)
CK, MB: 4 ng/mL (ref 0.3–4.0)
Relative Index: 3.9 — ABNORMAL HIGH (ref 0.0–2.5)
Total CK: 102 U/L (ref 7–177)

## 2010-10-08 LAB — BASIC METABOLIC PANEL
CO2: 25 mEq/L (ref 19–32)
Calcium: 7.9 mg/dL — ABNORMAL LOW (ref 8.4–10.5)
Creatinine, Ser: 0.58 mg/dL (ref 0.50–1.10)
Glucose, Bld: 77 mg/dL (ref 70–99)
Sodium: 133 mEq/L — ABNORMAL LOW (ref 135–145)

## 2010-10-08 LAB — PROTIME-INR
INR: 1.11 (ref 0.00–1.49)
Prothrombin Time: 14.5 seconds (ref 11.6–15.2)

## 2010-10-08 LAB — TROPONIN I: Troponin I: <0.3 ng/mL (ref <0.30)

## 2010-10-08 LAB — CBC
MCH: 27.9 pg (ref 26.0–34.0)
MCHC: 32.5 g/dL (ref 30.0–36.0)
Platelets: 344 10*3/uL (ref 150–400)

## 2010-10-08 NOTE — H&P (Signed)
NAMEJONNAE, [name] NO.:  [id number]  MEDICAL RECORD NO.:  [id number]  LOCATION:  WLED                         FACILITY:  [facility]  PHYSICIAN:  Gery Pray, MD      DATE OF BIRTH:  1930/10/20  DATE OF ADMISSION:  10/07/2010 DATE OF DISCHARGE:                             HISTORY & PHYSICAL   PRIMARY CARE PHYSICIAN:  Dr. Duane Lope with [facility].  GASTROENTEROLOGY:  Dr. Llana Aliment.  CARDIOLOGY:  Patient was last seen by [facility].  CODE STATUS:  Full code.  Patient goes to team 2.  CHIEF COMPLAINT:  Weakness.  HISTORY OF PRESENT ILLNESS:  This is a 77 year old female who was recently admitted and discharged from [facility] August 4 through 11. Since being home, she was initially doing okay; however, she more recently developed recurrent nausea, vomiting, and diarrhea.  No hematemesis.  She started getting much weaker.  She is getting some mild confusion.  No fevers.  No chills.  Her appetite has been poor.  They spoke with her doctor today who wanted to do blood work, he recommended that she come to the ER to get her blood work done.  In the ER, the patient's sodium was found to be 121.  This young lady has developed recent history of ongoing nausea and vomiting.  She was recently admitted here with diagnosis of hyponatremia with a sodium of 123.  She was seen by GI, Dr. Evette Cristal.  She had EGD, September 13, 2010, where she was found to have distal esophageal stricture, perhaps Schatzki ring.  She received balloon dilation.  She was also found to have diffuse gastritis.  She was biopsied for H. pylori.  She also had duodenitis.  She was discharged home with PPIs.  She also carries a history of psychogenic polydipsia.  During her last hospitalization, she was evaluated with urine osmolarity.  These were all normal.  She was fluid restricted.  Her sodium improved.  She was discharged with a sodium of 131.    When placed on the monitor in the  ER today, she was found to have a heart rate of 200. She received Cardizem drip/bolus with no significant response.  She was eventually started on esmolol drip and currently her heart rate is in the 100.  The patient does not report any chest pains.  She does not report any palpitations.  However, her husband states that she has been presyncopal in the last couple of days.  The patient did report that she felt that her bladder was full.  A Foley was placed.  So far, she has had over 2000 cc of urine output.  I am unclear how much urine was in the bladder when Foley was initially placed.  History obtained from the husband and nurse which is reliable.  Patient is a bit confused, possibly mildly demented.  REVIEW OF SYSTEMS:  All 10-point systems reviewed and negative as best able with the husband except as noted in HPI.  PAST MEDICAL HISTORY: 1. Esophageal stricture. 2. Paroxysmal AFib. 3. Severe hyponatremia. 4. Polymyalgia rheumatica. 5. Psychogenic polydipsia. 6. Chronic pain. 7. Depression. 8. History of azotemia.  PAST SURGICAL  HISTORY:  Cholecystectomy, right lung lobectomy due to pneumonia.  MEDICATIONS: 1. Ensure 3 times daily. 2. Lactose-free nutritional supplement as needed. 3. Protonix 40 mg twice daily. 4. Toprol-XL 50 mg daily. 5. Amitiza 8 mg twice daily. 6. Klor-Con 20 mEq daily. 7. Norco p.r.n. 8. Remeron p.r.n. sleep. 9. Senna p.r.n. 10.Valium 10 mg p.o. sleep. 11.Zofran p.r.n.  ALLERGIES:  No known drug allergies.  SOCIAL HISTORY:  Negative tobacco, alcohol, or illicit drugs.  She lives with her husband who is present at the bedside.  She is on no home oxygen.  Limited ambulation.  FAMILY HISTORY:  Significant for colon cancer.  PHYSICAL EXAMINATION:  VITAL SIGNS:  Blood pressure 126/88, initial heart rate over 200, currently 101, respirations 22, temperature 98.1, saturating 100% on room air. GENERAL:  Alert female, oriented. EYES:  Pink  conjunctivae.  PERRLA. ENT:  Moist oral mucosa.  Trachea midline. NECK:  Supple. LUNGS:  Clear to auscultation bilaterally.  No wheeze appreciated.  No use of accessory muscles. CARDIOVASCULAR:  Irregular rate and rhythm, tachycardic.  No JVD. ABDOMEN:  Soft, positive bowel sounds, nontender, nondistended.  No organomegaly. NEURO:  Cranial nerves II through XII grossly intact.  Sensation intact. MUSCULOSKELETAL:  Strength 5/5 in all extremities.  No clubbing, cyanosis, or edema. SKIN:  No rashes.  No subcutaneous crepitations. PSYCH:  Patient is alert and oriented; however, she is mildly encephalopathic.  LABS:  Troponin less than 0.3.  Initial sodium 121, potassium 7.0, chloride 89, BUN 3, creatinine 0.7.  Repeat BMP on i-STAT shows sodium of 126, potassium of 3.0.  Troponin 0.02.  potassium level again repeated 3.3.  White blood count 4.0, hematocrit of 32.3, hemoglobin 10.6.  UA is negative.  i-STAT done on previous admission in August, patient's TSH was 1.4.  2D echo done July 12, patient had EF of 60% to 65%.  Normal wall motions.  ASSESSMENT AND PLAN: 1. Severe hyponatremia. 2. History of psychogenic polydipsia.  Patient will be admitted.  We     will simply fluid restrict the patient.  Patient's sodium already     appears to be improving. 3. Recurrent nausea, vomiting, and diarrhea. 4. Gastritis and duodenitis. 5. Esophageal stricture. 6. Weight loss (patient states she has lost over 20 pounds).  We will     continue antiemetics, continue Protonix b.i.d.  We will order     gastroesophageal study.  We will also add tumor markers and check     C. diff. 7. Urinary retention.  Foley was placed in the ER.  Urine return was     obtained.  However, the patient insists on having the Foley     removed.  Therefore, this will be discontinued.  We will just do     bladder scans p.r.n., and if needed, we will replace Foley. 8. Atrial fibrillation with rapid ventricular response.  As  stated,     patient's TSH and echo done last admission were normal.  Currently,     patient is on esmolol drip.  We will continue this.  Cardiology is     aware.  We will continue patient's metoprolol at an increased     dosage, Lovenox will be ordered, and Coumadin. 9. Fibromyalgia. 10.Gastroesophageal reflux disease. 11.Polymyalgia rheumatica, stable.  Continue home medications.          ______________________________ Gery Pray, MD     DC/MEDQ  D:  10/07/2010  T:  10/08/2010  Job:  [id number]  Electronically Signed by Gery Pray MD on  10/08/2010 03:56:27 AM

## 2010-10-09 DIAGNOSIS — R9431 Abnormal electrocardiogram [ECG] [EKG]: Secondary | ICD-10-CM

## 2010-10-09 DIAGNOSIS — I499 Cardiac arrhythmia, unspecified: Secondary | ICD-10-CM

## 2010-10-09 LAB — AFP TUMOR MARKER: AFP-Tumor Marker: <1.3 ng/mL (ref 0.0–8.0)

## 2010-10-09 LAB — CBC
Hemoglobin: 10.9 g/dL — ABNORMAL LOW (ref 12.0–15.0)
RBC: 3.94 MIL/uL (ref 3.87–5.11)
WBC: 4.9 10*3/uL (ref 4.0–10.5)

## 2010-10-09 LAB — BASIC METABOLIC PANEL
CO2: 26 mEq/L (ref 19–32)
Chloride: 99 mEq/L (ref 96–112)
Creatinine, Ser: 0.59 mg/dL (ref 0.50–1.10)
Potassium: 4 mEq/L (ref 3.5–5.1)
Sodium: 134 mEq/L — ABNORMAL LOW (ref 135–145)

## 2010-10-09 LAB — IRON AND TIBC
Iron: 18 ug/dL — ABNORMAL LOW (ref 42–135)
Saturation Ratios: 6 % — ABNORMAL LOW (ref 20–55)
TIBC: 307 ug/dL (ref 250–470)
UIBC: 289 ug/dL (ref 125–400)

## 2010-10-09 LAB — PROTIME-INR
INR: 0.99 (ref 0.00–1.49)
Prothrombin Time: 13.3 seconds (ref 11.6–15.2)

## 2010-10-09 LAB — VITAMIN B12: Vitamin B-12: 411 pg/mL (ref 211–911)

## 2010-10-09 LAB — CANCER ANTIGEN 19-9: CA 19-9: 11.4 U/mL — ABNORMAL LOW (ref <35.0)

## 2010-10-10 DIAGNOSIS — F39 Unspecified mood [affective] disorder: Secondary | ICD-10-CM

## 2010-10-10 LAB — BASIC METABOLIC PANEL
CO2: 27 mEq/L (ref 19–32)
Calcium: 8.5 mg/dL (ref 8.4–10.5)
Chloride: 96 mEq/L (ref 96–112)
Creatinine, Ser: 0.55 mg/dL (ref 0.50–1.10)
Glucose, Bld: 105 mg/dL — ABNORMAL HIGH (ref 70–99)

## 2010-10-10 LAB — POCT I-STAT, CHEM 8
BUN: <3 mg/dL — ABNORMAL LOW (ref 6–23)
Calcium, Ion: 0.97 mmol/L — ABNORMAL LOW (ref 1.12–1.32)
Chloride: 89 mEq/L — ABNORMAL LOW (ref 96–112)
Glucose, Bld: 95 mg/dL (ref 70–99)

## 2010-10-10 NOTE — Discharge Summary (Signed)
NAMESTARLA, DELLER NO.:  [id number]  MEDICAL RECORD NO.:  [id number]  LOCATION:  1420                         FACILITY:  [facility]  PHYSICIAN:  Hillery Aldo, M.D.   DATE OF BIRTH:  [birth year]  DATE OF ADMISSION:  10/07/2010 DATE OF DISCHARGE:  10/10/2010                              DISCHARGE SUMMARY   PRIMARY CARE PHYSICIAN:  Hessie Diener (C.Alan) Tenny Craw, MD  CARDIOLOGIST:  Veverly Fells. Anne Fu, MD  DISCHARGE DIAGNOSES: 1. Hyponatremia secondary to psychogenic polydipsia. 2. Recurrent nausea, vomiting, diarrhea in a patient with a history of     gastritis, duodenitis, and esophageal stricture, resolved. 3. Supraventricular tachycardia, status post cardiology evaluation,     likely triggered by electrolyte imbalances. 4. Abnormal weight loss, outpatient followup recommended. 5. Urinary retention, resolved. 6. Hypokalemia, resolved. 7. Normocytic anemia, Hemoccult-negative stool x1. 8. History of polymyalgia rheumatica and chronic pain syndrome. 9. Questionable paranoia, status post psychiatry evaluation.  DISCHARGE MEDICATIONS: 1. Amitiza 8 mg p.o. b.i.d. p.r.n. abdominal cramping. 2. Norco 10/325 one tablet p.o. q.i.d. p.r.n. pain. 3. Ocean nasal spray 2 sprays intranasally daily p.r.n. dryness. 4. Protonix 40 mg p.o. daily. 5. Senna 8.6 mg p.o. daily p.r.n. constipation. 6. Toprol-XL 50 mg p.o. daily. 7. Extra Strength Tylenol 500 mg p.o. q.6 h. p.r.n. pain. 8. Valium 10 mg p.o. nightly p.r.n. anxiety. 9. Zofran 4 mg p.o. t.i.d. p.r.n. nausea.  Note that following medications have been discontinued:  Remeron, Klor- Con.  BRIEF ADMISSION HISTORY OF PRESENT ILLNESS:  The patient is a 77 year old female who presented to the hospital with chief complaint of generalized weakness, in the setting of recurrent problems with hyponatremia, related to psychogenic polydipsia.  She reported having developed nausea, vomiting, diarrhea, and concurrent  musculoskeletal weakness, prompting her evaluation in the emergency department.  Upon initial evaluation, the patient was found to have a sodium of 126, subsequently was referred to the hospitalist service for further evaluation and treatment.  She also was noted to be in supraventricular tachycardia with a ventricular rate, greater than 220.  For the full details, please see the dictated report done by Dr. Haroldine Laws.  CONSULTATIONS: 1. Gerrit Friends. Dietrich Pates, MD, [facility], of Cardiology. 2. Eulogio Ditch, MD, of Psychiatry.  PROCEDURES AND DIAGNOSTIC STUDIES:  None.  DISCHARGE LABORATORY VALUES:  Sodium was 132, potassium 4.2, chloride 96, bicarbonate 27, BUN less than 3, creatinine 0.55, glucose 105, calcium 8.5.  Fecal occult blood testing was negative.  Iron was 18, total iron binding capacity 307, 6% saturation, urine iron-binding capacity 289, vitamin B12 of 411, serum folate 5.7, ferritin 18.  White blood cell count was 4.9, hemoglobin 10.9, hematocrit 34.4, platelets 382.  CA19-9 was 11.4.  Alpha fetoprotein was less than 1.3.  CEA was 3.2.  Urine cultures were negative.  Cardiac markers were negative with the exception of a mild nonspecific CK MB elevation with negative troponins.  HOSPITAL COURSE BY PROBLEM: 1. Hyponatremia secondary to psychogenic polydipsia:  The patient has     recurrent problems with this and recurrent hospitalizations for     treatment of the same.  It always responds to a fluid restriction     of 1000-1200 cc per 24-hour  period of time.  Would avoid SSRI     therapy given its association with hyponatremia.  She was on     Remeron prior to admission, but her husband states she never     actually took this medicine since it was new prescription.  Because     of her concurrent psychiatric overlay, psychiatry evaluation was     requested and kindly provided by Dr. Rogers Blocker who has cleared her     for discharge.  The patient has been counseled extensively      regarding the importance of maintaining a fluid restriction in     outpatient environment. 2. Recurrent nausea/vomiting/diarrhea in a patient with a history of     gastritis/duodenitis/esophageal stricture:  The patient's symptoms     improved with symptomatic treatment of her hyponatremia and no     current complaints. 3. Supraventricular tachycardia with abnormal EKG:  The patient was     admitted and initially put on an esmolol drip for heart rate     control.  Cardiology consultation was requested and kindly provided     by Dr. Dietrich Pates.  Her cardiac markers were negative with exception     of mild CK MB elevation.  Her rate subsequently was controlled on     beta-blocker therapy.  Coumadin was not initiated since she was not     found to be in atrial fibrillation, although she had significant     ectopy noted on telemetry.  Dr. Dietrich Pates felt that she has     paroxysmal arrhythmias, related to physiologic stress from     electrolyte imbalances and recommends ongoing therapy with beta-     blocker treatment.  He did not feel that she had a substantial risk     of thromboembolism related to her arrhythmia and therefore did not     recommend Coumadin.  Followup should be with her cardiologist, Dr.     Anne Fu, at [facility].  She has been advised to call for an     appointment time. 4. Abnormal weight loss:  The patient did have tumor markers checked     and these were negative.  Her TSH was checked back on a previous     hospitalization in the early part of August and this was within     normal limits.  Recommended outpatient followup for ongoing     evaluation. 5. Urinary retention:  Transient problem noted by the ER physicians.     A Foley catheter was placed and subsequently removed without any     recurrent problems. 6. Hypokalemia:  The patient's potassium was fully repleted. 7. Normocytic anemia:  Anemia panel was done which shows some iron     deficiency.  Her stool  was checked for Hemoccult and this was     negative.  Recommended outpatient followup and a GI evaluation if     she has not already had one. 8. Polymyalgia rheumatica with chronic pain:  The patient was stable.  DISPOSITION:  The patient is medically stable and has been cleared for discharge by both Cardiology and Psychiatry.  CONDITION AT DISCHARGE:  Improved.  DISCHARGE INSTRUCTIONS:  Activity:  No restrictions.  Diet:  Liberalize salt intake and limit fluids to 40 ounces or 1200 cc daily.  Follow up:  With Dr. Duane Lope in 1 week, call for an appointment. Follow up with Dr. Anne Fu in 1 week, call for an appointment.  Follow up with Dr. Fayrene Fearing of  Gastroenterology as needed.  Time spent coordinating care for discharge, discharge instructions including face-to-face time equals approximately 35 minutes.     Hillery Aldo, M.D.     CR/MEDQ  D:  10/10/2010  T:  10/10/2010  Job:  [id number]  cc:   Magnus Sinning) Tenny Craw, M.D. Fax: [phone]  Jake Bathe, MD Fax: [phone]  Electronically Signed by Hillery Aldo M.D. on 10/10/2010 29:56:21 PM

## 2010-10-11 NOTE — Consult Note (Signed)
  NAMECYRIL, RAILEY NO.:  [id number]  MEDICAL RECORD NO.:  [id number]  LOCATION:  1420                         FACILITY:  [facility]  PHYSICIAN:  Eulogio Ditch, MD DATE OF BIRTH:  [birth year]  DATE OF CONSULTATION:  10/10/2010 DATE OF DISCHARGE:  10/10/2010                                CONSULTATION   REASON FOR CONSULTATION:  Psychogenic polydipsia.  HISTORY OF PRESENT ILLNESS:  A 77 year old female who was admitted to the medical floor because of recurrent nausea, vomiting, and diarrhea. The patient was very logical and goal-directed during the interview. She is difficult to interview because of her difficulty in hearing, but otherwise she is redirectable, is not hallucinating or delusional.  On asking about how much of water she drinks, she told me that she drinks only 4 glasses of water.  The patient has no history of being admitted to Psychiatry, but she has a history of depression, and for that she is on Remeron and Cymbalta.  PAST MEDICAL HISTORY:  History of paroxysmal atrial fibrillation, polymyalgia, rheumatica, chronic pain, esophageal stricture.  ALLERGIES:  No known drug allergies.  SUBSTANCE ABUSE HISTORY:  No history of illicit drug abuse or alcohol abuse.  MENTAL STATUS EXAMINATION:  The patient is calm, cooperative during interview, present on approach.  Speech; slow, soft.  Mood; anxious affect, mood congruent.  Thought process logical and goal-directed. Does not seem to be internally preoccupied, hallucinating, or delusional.  Cognition; alert, awake, oriented x3.  Memory; immediate, recent, remote, fair-to-poor. Attention, concentration poor. Abstraction ability fair.  Insight and judgment fair.  DIAGNOSES:  AXIS I:  Depressive disorder not otherwise specified, rule out depression due to the medical issues.  AXIS II:  Deferred.  AXIS III:  See medical notes.  AXIS IV:  Chronic medical issues.  AXIS V: 55.  RECOMMENDATIONS: 1.  At this time, the patient does not meet criteria to be admitted in     the inpatient setting.  The patient can be     followed in the outpatient setting and can be continued on Cymbalta     and Remeron. 2. The patient was advised to follow up with Psychiatry in the     outpatient setting.  Thanks for involving me in taking care of this patient.     Eulogio Ditch, MD     SA/MEDQ  D:  10/11/2010  T:  10/11/2010  Job:  [id number]  Electronically Signed by Eulogio Ditch  on 10/11/2010 03:01:05 PM

## 2010-10-18 NOTE — Op Note (Signed)
  NAMEJANETE, QUILLING NO.:  [id number]  MEDICAL RECORD NO.:  [id number]  LOCATION:  1418                         FACILITY:  [facility]  PHYSICIAN:  Graylin Shiver, M.D.   DATE OF BIRTH:  24-Nov-1930  DATE OF PROCEDURE:  09/13/2010 DATE OF DISCHARGE:                              OPERATIVE REPORT   PROCEDURES PERFORMED:  Esophagogastroduodenoscopy with endoscopic balloon dilatation and biopsy.  INDICATION FOR PROCEDURE:  Persistent complaints of vomiting associated with weight loss over the past several weeks.  Informed consent was obtained after explanation of the risks of bleeding, infection, and perforation.  PREMEDICATION:  Fentanyl 50 mcg IV, Versed 6 mg IV, Benadryl 25 mg IV.  PROCEDURE:  With the patient in the left lateral decubitus position, the Pentax gastroscope was inserted into the oropharynx and passed into the esophagus.  It was advanced down the esophagus whose mucosa looked normal and then when the scope was advanced down to 38 cm the lumen narrowed down and at that point I was unable to advance the scope into the stomach.  At this level there was an distal esophageal stricture, which looked more like a strictured Schatzki ring rather than a peptic stricture.  I was able to get the tip of the scope right into the region of the ring; however, it would not pass into the stomach.  I therefore advanced an endoscopic balloon dilator down the scope and appropriately placed it at the level of the stricture.  The balloon was inflated up to 12 mm and held in place for 1 minute.  The balloon was then deflated and the area was inspected.  It appeared to have been nicely dilated and then I was able to pass the scope into the stomach.  It was then advanced down into the duodenum.  The second portion of the duodenum looked normal.  There was erythematous appearance to the bulb, which looked like duodenitis but no ulcers or erosions were seen.  The  stomach showed a diffuse moderate erythematous appearance to the mucosa compatible with gastritis and a biopsy was obtained from the distal stomach for histology and primarily to check for H. pylori. Retroflexion revealed a small hiatal hernia, but otherwise unremarkable. The scope was then straightened and brought out.  She tolerated the procedure well without complications.  IMPRESSION: 1. Distal esophageal stricture, which I suspect was due to a     strictured Schatzki ring.  This was dilated to 12 mm     with a balloon dilator. 2. Diffuse gastritis, biopsied for Helicobacter pylori. 3. Duodenitis.          ______________________________ Graylin Shiver, M.D.     SFG/MEDQ  D:  09/13/2010  T:  09/14/2010  Job:  [id number]  cc:   [facility]  Willis Modena, MD Fax: [phone]  Dr. Miguel Aschoff  Electronically Signed by Herbert Moors MD on 10/18/2010 08:52:41 AM

## 2010-10-21 ENCOUNTER — Emergency Department (HOSPITAL_COMMUNITY): Payer: Medicare Other

## 2010-10-21 ENCOUNTER — Inpatient Hospital Stay (HOSPITAL_COMMUNITY)
Admission: EM | Admit: 2010-10-21 | Discharge: 2010-10-31 | DRG: 481 | Disposition: A | Discharge status: Skilled Nursing Facility | Payer: Medicare Other | Attending: Internal Medicine | Admitting: Internal Medicine

## 2010-10-21 DIAGNOSIS — E871 Hypo-osmolality and hyponatremia: Secondary | ICD-10-CM | POA: Diagnosis present

## 2010-10-21 DIAGNOSIS — R631 Polydipsia: Secondary | ICD-10-CM | POA: Diagnosis present

## 2010-10-21 DIAGNOSIS — F0390 Unspecified dementia without behavioral disturbance: Secondary | ICD-10-CM | POA: Diagnosis present

## 2010-10-21 DIAGNOSIS — K589 Irritable bowel syndrome without diarrhea: Secondary | ICD-10-CM | POA: Diagnosis present

## 2010-10-21 DIAGNOSIS — Y92009 Unspecified place in unspecified non-institutional (private) residence as the place of occurrence of the external cause: Secondary | ICD-10-CM

## 2010-10-21 DIAGNOSIS — F458 Other somatoform disorders: Secondary | ICD-10-CM | POA: Diagnosis present

## 2010-10-21 DIAGNOSIS — I498 Other specified cardiac arrhythmias: Secondary | ICD-10-CM | POA: Diagnosis present

## 2010-10-21 DIAGNOSIS — F329 Major depressive disorder, single episode, unspecified: Secondary | ICD-10-CM | POA: Diagnosis present

## 2010-10-21 DIAGNOSIS — R339 Retention of urine, unspecified: Secondary | ICD-10-CM | POA: Diagnosis not present

## 2010-10-21 DIAGNOSIS — S72033A Displaced midcervical fracture of unspecified femur, initial encounter for closed fracture: Principal | ICD-10-CM | POA: Diagnosis present

## 2010-10-21 DIAGNOSIS — M353 Polymyalgia rheumatica: Secondary | ICD-10-CM | POA: Diagnosis present

## 2010-10-21 DIAGNOSIS — F3289 Other specified depressive episodes: Secondary | ICD-10-CM | POA: Diagnosis present

## 2010-10-21 DIAGNOSIS — W19XXXA Unspecified fall, initial encounter: Secondary | ICD-10-CM | POA: Diagnosis present

## 2010-10-21 DIAGNOSIS — E876 Hypokalemia: Secondary | ICD-10-CM | POA: Diagnosis present

## 2010-10-21 DIAGNOSIS — I4891 Unspecified atrial fibrillation: Secondary | ICD-10-CM | POA: Diagnosis not present

## 2010-10-21 LAB — APTT: aPTT: 31 seconds (ref 24–37)

## 2010-10-21 LAB — PROTIME-INR: Prothrombin Time: 13 seconds (ref 11.6–15.2)

## 2010-10-21 LAB — DIFFERENTIAL
Basophils Absolute: 0 10*3/uL (ref 0.0–0.1)
Basophils Relative: 0 % (ref 0–1)
Eosinophils Absolute: 0 10*3/uL (ref 0.0–0.7)
Eosinophils Relative: 0 % (ref 0–5)
Monocytes Absolute: 0.4 10*3/uL (ref 0.1–1.0)
Monocytes Relative: 5 % (ref 3–12)

## 2010-10-21 LAB — COMPREHENSIVE METABOLIC PANEL
ALT: 9 U/L (ref 0–35)
Albumin: 3 g/dL — ABNORMAL LOW (ref 3.5–5.2)
Alkaline Phosphatase: 83 U/L (ref 39–117)
BUN: 3 mg/dL — ABNORMAL LOW (ref 6–23)
Calcium: 8.5 mg/dL (ref 8.4–10.5)
GFR calc Af Amer: >60 mL/min (ref >60)
Glucose, Bld: 121 mg/dL — ABNORMAL HIGH (ref 70–99)
Potassium: 3.3 mEq/L — ABNORMAL LOW (ref 3.5–5.1)
Sodium: 117 mEq/L — CL (ref 135–145)
Total Protein: 6.8 g/dL (ref 6.0–8.3)

## 2010-10-21 LAB — CBC
Hemoglobin: 11.4 g/dL — ABNORMAL LOW (ref 12.0–15.0)
MCH: 29.3 pg (ref 26.0–34.0)
MCHC: 35.5 g/dL (ref 30.0–36.0)
RDW: 18.9 % — ABNORMAL HIGH (ref 11.5–15.5)

## 2010-10-21 LAB — BASIC METABOLIC PANEL
Calcium: 9 mg/dL (ref 8.4–10.5)
Creatinine, Ser: 0.51 mg/dL (ref 0.50–1.10)
GFR calc Af Amer: >60 mL/min (ref >60)
Sodium: 120 mEq/L — ABNORMAL LOW (ref 135–145)

## 2010-10-21 LAB — MRSA PCR SCREENING: MRSA by PCR: NEGATIVE

## 2010-10-22 LAB — CBC
MCH: 29.5 pg (ref 26.0–34.0)
MCHC: 35.2 g/dL (ref 30.0–36.0)
MCV: 83.6 fL (ref 78.0–100.0)
Platelets: 282 10*3/uL (ref 150–400)
RDW: 19.3 % — ABNORMAL HIGH (ref 11.5–15.5)

## 2010-10-22 LAB — GLUCOSE, CAPILLARY

## 2010-10-22 LAB — BASIC METABOLIC PANEL
Calcium: 9 mg/dL (ref 8.4–10.5)
Creatinine, Ser: 0.52 mg/dL (ref 0.50–1.10)
GFR calc Af Amer: >60 mL/min (ref >60)
GFR calc non Af Amer: >60 mL/min (ref >60)
Sodium: 127 mEq/L — ABNORMAL LOW (ref 135–145)

## 2010-10-22 LAB — MAGNESIUM: Magnesium: 2.1 mg/dL (ref 1.5–2.5)

## 2010-10-22 LAB — SODIUM, URINE, RANDOM: Sodium, Ur: 10 mEq/L

## 2010-10-22 LAB — CREATININE, URINE, RANDOM: Creatinine, Urine: 5.68 mg/dL

## 2010-10-23 ENCOUNTER — Inpatient Hospital Stay (HOSPITAL_COMMUNITY): Payer: Medicare Other

## 2010-10-23 LAB — BASIC METABOLIC PANEL
CO2: 26 mEq/L (ref 19–32)
Chloride: 93 mEq/L — ABNORMAL LOW (ref 96–112)
GFR calc non Af Amer: >60 mL/min (ref >60)
Glucose, Bld: 112 mg/dL — ABNORMAL HIGH (ref 70–99)
Potassium: 3.9 mEq/L (ref 3.5–5.1)
Sodium: 128 mEq/L — ABNORMAL LOW (ref 135–145)

## 2010-10-23 LAB — SURGICAL PCR SCREEN: MRSA, PCR: NEGATIVE

## 2010-10-23 LAB — ABO/RH: ABO/RH(D): A POS

## 2010-10-23 LAB — TYPE AND SCREEN: Antibody Screen: NEGATIVE

## 2010-10-24 LAB — CBC
HCT: 28.7 % — ABNORMAL LOW (ref 36.0–46.0)
MCH: 29.2 pg (ref 26.0–34.0)
MCHC: 34.1 g/dL (ref 30.0–36.0)
MCV: 85.4 fL (ref 78.0–100.0)
RDW: 19.3 % — ABNORMAL HIGH (ref 11.5–15.5)

## 2010-10-24 LAB — BASIC METABOLIC PANEL
BUN: 3 mg/dL — ABNORMAL LOW (ref 6–23)
Calcium: 8.1 mg/dL — ABNORMAL LOW (ref 8.4–10.5)
Creatinine, Ser: 0.47 mg/dL — ABNORMAL LOW (ref 0.50–1.10)
GFR calc Af Amer: >60 mL/min (ref >60)
GFR calc non Af Amer: >60 mL/min (ref >60)

## 2010-10-24 NOTE — H&P (Signed)
[name], REGGIO NO.:  [id number]  MEDICAL RECORD NO.:  [id number]  LOCATION:  1432                         FACILITY:  [facility]  PHYSICIAN:  Dolora Ridgely, DO         DATE OF BIRTH:  1930/09/03  DATE OF ADMISSION:  08/24/2010 DATE OF DISCHARGE:                             HISTORY & PHYSICAL   CHIEF COMPLAINT:  Hyponatremia.  HISTORY OF PRESENT ILLNESS:  The patient is a 77 year old female who was discharged from this facility on August 06, 2010, after a stay in this facility for intractable nausea, vomiting, and hyponatremia.  The patient states that for the past 2-1/2 weeks, she has had no appetite. According to her husband, she has drunk only water, she has had nothing to eat.  She states that she just has no appetite and she has nausea and she began vomiting a couple weeks ago and it is steadily getting worse. She saw her gastroenterologist who is Dr. Randa Evens yesterday and he drew some lab work anticipating doing an EGD on her, but he found her to be significantly hyponatremic and hypokalemic and so he sent her to the emergency department.  PAST MEDICAL HISTORY:  Significant for hyponatremia, polymyalgia rheumatica, chronic pain, metabolic acidosis, irritable bowel syndrome, depression, colon polyps.  PAST SURGICAL HISTORY:  Significant for cholecystectomy and she has also had a right lung lobectomy for pneumonia.  FAMILY HISTORY:  Positive for colon cancer.  SOCIAL HISTORY:  No tobacco, no alcohol, no recreational drug use.  She lives at home with her husband.  REVIEW OF SYSTEMS:  CONSTITUTIONAL:  Negative for fever, negative for chills.  Positive for weakness, possible for malaise.  CNS:  No headache, no seizures, no limb weakness.  ENT: No nasal congestion, no throat pain, no coryza.  CARDIOVASCULAR:  No chest sign, no palpitations, no orthopnea.  RESPIRATORY:  No cough.  No shortness of breath.  No wheezing.  GASTROINTESTINAL:  Positive for nausea.   Positive for vomiting.  Negative for constipation.  Negative for diarrhea. GENITOURINARY:  No dysuria.  No hematuria.  No urinary frequency. RENAL:  No flank pain.  No swelling.  No pruritus.  SKIN:  No rashes. No sores.  No lesions.  HEMATOLOGIC:  No easy bruising.  No purpura.  No clots.  LYMPHS:  No lymphadenopathy.  No painful lymph nodes.  No specific lymph swelling.  PSYCHIATRIC:  Positive for depression. Negative for anxiety.  Negative for insomnia.  PHYSICAL EXAMINATION:  VITAL SIGNS:  Temperature 97.3, pulse 85, respiratory rate 20, blood pressure 143/86 O2 sat 100% on room air. GENERAL:  The patient is extremely hard of hearing.  Most of the history is taken from her husband.  The patient appears very pale.  She has a waxy sort of complexion.  She is awake and alert and she does answer appropriately, but in order to be heard you really must sort of yell at the top of your lungs. EYES:  Pupils equal, round and reactive to light and accommodation. External ocular movements bilaterally intact.  Sclerae nonicteric, noninjected.  Mouth, oral mucosa moist.  No lesions.  No sores.  Pharynx clean.  No erythema.  No  exudate. NECK:  Negative for JVD.  Negative for thyromegaly.  Negative for lymphadenopathy. HEART:  Regular rate and rhythm at 80 beats per minute without murmur, ectopy, or gallops.  No lateral PMI.  No thrills. LUNGS:  Clear to auscultation bilaterally without wheezes, rales, or rhonchi.  No increased work of breathing.  No tactile fremitus. ABDOMEN:  Soft, nontender, nondistended.  Positive bowel sounds.  No hepatosplenomegaly.  No hernias palpated. EXTREMITIES:  Negative for cyanosis, clubbing, or edema.  The patient has slightly diminished dorsalis pedis and popliteal pulses bilaterally. No carotid bruits bilaterally. NEUROLOGICAL:  Cranial nerves II-XII grossly intact.  Motor and sensory intact.  LABORATORY DATA:  WBC 6.5, hemoglobin 12, hematocrit 35.5, platelets  are 319.  Sodium 119, potassium 3.4, chloride 79, CO2 of 17, BUN 5.1, creatinine 0.67, calcium 9.6.  EKG demonstrates atrial fibrillation with a controlled rate of 77.  She has some downgoing Ts anteriorly, but she also has a left anterior fascicular block and a right bundle-branch block.  ASSESSMENT: 1. Hyponatremia likely due to hypovolemia. 2. Intractable nausea and vomiting. 3. Metabolic acidosis, which has been apparent on prior admissions as     well. 4. Atrial fibrillation.  So far as I can tell, this is a new     diagnosis.  With the patient's poor appetite and apparent anorexia,     I think she is a very poor candidate for Coumadin, however, at this     point we will place her on 1 mg/kg Lovenox.  She does not require     anything to control her rate, it is controlled at about 77,     however, she will be admitted to telemetry. 5. Polymyalgia rheumatica. 6. Irritable bowel syndrome. 7. Depression.  PLAN: 1. Admit to telemetry. 2. IV normal saline. 3. Consult GI when the patient's electrolytes are more in line. 4. Check echocardiogram for new-onset atrial fibrillation.  HOME MEDICATIONS:  As of last discharge include, as we do not have a current home med list, 1. Zofran 4 mg 1 p.o. q.8 h. p.r.n. nausea. 2. Prednisone 50 mg 1 p.o. daily for 30 days. 3. Tylenol Extra Strength 1 tablet every 5 hours as needed for pain. 4. Diazepam 10 mg 1 p.o. q.h.s. and diazepam 5 mg 1 p.o. q.a.m. 5. Hydrocodone acetaminophen 10/325 one tablet p.o. every 5 hours as     needed for pain. 6. Reglan 10 mg 1 p.o. t.i.d. 7. Protonix 40 mg 1 p.o. daily. 8. Hydrochlorothiazide, dose unknown, 1 tablet p.o. q.a.m. 9. Cymbalta 30 mg 1 p.o. q.a.m. 10.Remeron 15 mg 1 p.o. q.h.s.  ALLERGIES:  DEMEROL, DILAUDID, CODEINE, NOVOCAINE, COMPAZINE, AND TETANUS TOXOID.  I spent 40 minutes on this admission.          ______________________________ Fran Lowes, DO     AS/MEDQ  D:  08/24/2010  T:   08/24/2010  Job:  [id number]  cc:   Dr. Maryclare Labrador L. Malon Kindle., M.D. Fax: [phone]  Electronically Signed by Fran Lowes DO on 10/24/2010 01:55:04 PM

## 2010-10-25 LAB — DIFFERENTIAL
Basophils Absolute: 0 10*3/uL (ref 0.0–0.1)
Eosinophils Absolute: 0 10*3/uL (ref 0.0–0.7)
Eosinophils Relative: 0 % (ref 0–5)
Monocytes Absolute: 0.9 10*3/uL (ref 0.1–1.0)

## 2010-10-25 LAB — BASIC METABOLIC PANEL
Calcium: 8.4 mg/dL (ref 8.4–10.5)
Creatinine, Ser: <0.47 mg/dL — ABNORMAL LOW (ref 0.50–1.10)
Glucose, Bld: 98 mg/dL (ref 70–99)
Sodium: 130 mEq/L — ABNORMAL LOW (ref 135–145)

## 2010-10-25 LAB — CBC
MCHC: 34.1 g/dL (ref 30.0–36.0)
MCV: 85.4 fL (ref 78.0–100.0)
Platelets: 282 10*3/uL (ref 150–400)
RDW: 19 % — ABNORMAL HIGH (ref 11.5–15.5)
WBC: 4.8 10*3/uL (ref 4.0–10.5)

## 2010-10-25 LAB — PROTIME-INR: INR: 1.09 (ref 0.00–1.49)

## 2010-10-26 ENCOUNTER — Inpatient Hospital Stay (HOSPITAL_COMMUNITY): Payer: Medicare Other

## 2010-10-26 LAB — BASIC METABOLIC PANEL
Glucose, Bld: 86 mg/dL (ref 70–99)
Potassium: 3.7 mEq/L (ref 3.5–5.1)
Sodium: 131 mEq/L — ABNORMAL LOW (ref 135–145)

## 2010-10-26 NOTE — Op Note (Signed)
  NAMEMARRIE, [name] NO.:  [id number]  MEDICAL RECORD NO.:  [id number]  LOCATION:                                 FACILITY:  PHYSICIAN:  Eulas Post, MD    DATE OF BIRTH:  01/28/1931  DATE OF PROCEDURE:  10/23/2010 DATE OF DISCHARGE:                              OPERATIVE REPORT   ATTENDING SURGEON:  Eulas Post, MD  PREOPERATIVE DIAGNOSIS:  Left femoral neck fracture.  POSTOPERATIVE DIAGNOSIS:  Left femoral neck fracture.  OPERATIVE PROCEDURE:  Left hip closed reduction and percutaneous pinning.  ANESTHESIA:  General.  ESTIMATED BLOOD LOSS:  Minimal.  OPERATIVE IMPLANTS:  Synthes 7.3-mm cannulated screws x3.  PREOPERATIVE INDICATIONS:  [name] is a 77 year old woman who broke her left hip.  She was unable to walk.  She ultimately elected for surgical management.  She does have dementia, and had a difficult time ultimately consenting to the surgery, however, her husband who is her power of attorney also consented.  Ultimately, they decided for surgical management.  The risks, benefits, and alternatives were discussed before the procedure including but not limited to risks of infection, bleeding, nerve injury, malunion, nonunion, hardware prominence, hardware failure, need for hardware removal, fracture of the neck with more displacement and ultimately healed requiring hemiarthroplasty, cardiopulmonary complications, among others and they are willing to proceed.  OPERATIVE PROCEDURE:  The patient was brought to the operating room, placed in the supine position.  IV antibiotics were given.  General anesthesia was administered.  The left lower extremity was prepped and draped in the usual sterile fashion.  Gentle traction was applied.  I did not perform a reduction maneuver.  She was on the fracture table. After a sterile prep and drape, time-out was performed and a small incision was made over the lateral aspect of the proximal  femur.  Total of three guidewires were introduced.  I placed two superiorly and one inferiorly in order to minimize stress risers.  The guidewires were placed in the appropriate position, and then I measured and drilled the lateral cortices and then placed three screws.  Bone quality was mediocre to poor.  Once this has been completed, I irrigated the wounds copiously and repaired the subcutaneous tissue with Vicryl followed by Steri-Strips and sterile gauze for skin.  The patient was awakened and returned to the PACU in stable and satisfactory condition.  There were no complications.  She tolerated the procedure well.  She will be on an anticoagulation for a period of 1 month postoperatively for DVT prophylaxis, and can be weightbearing as tolerated.     Eulas Post, MD     JPL/MEDQ  D:  10/23/2010  T:  10/24/2010  Job:  [id number]  Electronically Signed by Teryl Lucy MD on 10/26/2010 11:02:09 AM

## 2010-10-27 LAB — URINALYSIS, MICROSCOPIC ONLY
Protein, ur: NEGATIVE mg/dL
Urobilinogen, UA: 0.2 mg/dL (ref 0.0–1.0)

## 2010-10-27 LAB — PROTIME-INR: Prothrombin Time: 19.8 seconds — ABNORMAL HIGH (ref 11.6–15.2)

## 2010-10-27 LAB — CBC
Platelets: 336 10*3/uL (ref 150–400)
RBC: 3.37 MIL/uL — ABNORMAL LOW (ref 3.87–5.11)
WBC: 4.2 10*3/uL (ref 4.0–10.5)

## 2010-10-28 LAB — URINE CULTURE
Colony Count: 85000
Culture  Setup Time: 201209211405

## 2010-10-29 LAB — PROTIME-INR: Prothrombin Time: 24.4 seconds — ABNORMAL HIGH (ref 11.6–15.2)

## 2010-10-31 LAB — BASIC METABOLIC PANEL
CO2: 26 mEq/L (ref 19–32)
Calcium: 8.9 mg/dL (ref 8.4–10.5)
Chloride: 91 mEq/L — ABNORMAL LOW (ref 96–112)
Glucose, Bld: 90 mg/dL (ref 70–99)
Sodium: 125 mEq/L — ABNORMAL LOW (ref 135–145)

## 2010-10-31 LAB — CBC
HCT: 29.7 % — ABNORMAL LOW (ref 36.0–46.0)
MCHC: 33.3 g/dL (ref 30.0–36.0)
MCV: 85.1 fL (ref 78.0–100.0)
RDW: 17.8 % — ABNORMAL HIGH (ref 11.5–15.5)
WBC: 7 10*3/uL (ref 4.0–10.5)

## 2010-11-08 NOTE — Consult Note (Signed)
[name], [name] NO.:  [id number]  MEDICAL RECORD NO.:  [id number]  LOCATION:  1420                         FACILITY:  [facility]  PHYSICIAN:  Gerrit Friends. Dietrich Pates, MD, FACCDATE OF BIRTH:  August 16, 1930  DATE OF CONSULTATION:  10/09/2010 DATE OF DISCHARGE:                                CONSULTATION   REFERRING PHYSICIAN:  Hillery Aldo, MD  PRIMARY CARE DOCTOR:  Hessie Diener (C.Alan) Tenny Craw, MD  PRIMARY CARDIOLOGIST:  Veverly Fells. Anne Fu, MD, with [facility].  HISTORY OF PRESENT ILLNESS:  A 77 year old woman with significant dementia, recently diagnosed psychogenic polydipsia, and multiple gastrointestinal issues producing recent bouts of nausea, emesis, and diarrhea, referred for evaluation of arrhythmia and abnormal EKG.  [name] was previously evaluated for what was thought to be atrial fibrillation by Dr. Donato Schultz 1 month ago.  His review of the tracings appropriately pointed out that the rhythm was not atrial fibrillation. His impression was that it represented multifocal atrial tachycardia. He also pointed out that this is often seen in patients with underlying lung disease, but the patient has no history of cigarette smoking nor known pulmonary disease.  A previous echocardiogram was interpreted as showing pulmonary hypertension.  Left ventricular size and function were reportedly normal.  PAST MEDICAL HISTORY: 1. Notable for polymyalgia rheumatica. 2. Irritable bowel syndrome. 3. Depression. 4. Colonic polyps. 5. History of atrial fibrillation that may well have represented her     current arrhythmia. 6. She has undergone cholecystectomy and right lung lobectomy, the     latter for pneumonia. 7. Bilateral cataract extraction. 8. Excisional breast biopsy for benign disease in 1975. 9. Sinus surgery on 2 occasions in the 1980s. 10.Esophageal stricture. 11.Chronic pain. 12.History of azotemia.  CURRENT MEDICATIONS:  As listed in her medical  reconciliation form.  ALLERGIES:  No known drug allergies, but the inpatient chart indicates allergies to MEPERIDINE, HYDROMORPHONE, CODEINE, PROCAINE, PROCHLORPERAZINE, and TETANUS TOXOID, all presenting as a rash.  SOCIAL HISTORY:  Married and lives locally; no tobacco nor alcohol use. Sedentary lifestyle.  FAMILY HISTORY:  Positive for colon carcinoma in her father.  No prominent coronary disease among first-degree relatives.  No arrhythmias.  REVIEW OF SYSTEMS:  Markedly impaired hearing acuity; repetitive thought patterns and confusion suggesting dementia, although she apparently does not carry a specific diagnosis of that condition.  No history of palpitations.  PHYSICAL EXAMINATION:  GENERAL:  Woman with flat affect, repeatedly asking to go home, and in no apparent distress. VITAL SIGNS:  The temperature is 98.6, blood pressure 115/80, heart rate 95 and irregular, respirations 18, O2 saturation 98% on room air. Weight 56.8 kg. HEENT:  Normal lids and conjunctivae; normal oral mucosa. LUNGS:  Clear with mild kyphosis. CARDIAC:  Irregular rhythm; normal first and second heart sounds; modest systolic murmur. ABDOMEN:  Soft and nontender; normal bowel sounds; no organomegaly. EXTREMITIES:  No edema; distal pulses intact. NEUROLOGIC:  Symmetric strength and tone; normal cranial nerves.  EKG:  Sinus rhythm with frequent PACs; right bundle branch block; nonspecific T wave abnormality.  Initial EKG showed sinus rhythm with incessant runs of supraventricular tachycardia.  Criteria for multifocal atrial tachycardia are met, but that is not clearly  present.  Initial serum sodium was 121, but corrected quite rapidly.  She had mild anemia with an anemia profile, consistent with iron deficiency.  Chest x- ray showed no acute abnormalities, but a chronically elevated left hemidiaphragm with left pleural scarring.  Cardiac markers were negative.  IMPRESSION:  [name] has a  recurrent arrhythmia when she is 77 under physiologic stress.  It could be called MAT, but unless PFT showed substantial abnormalities, I would not be inclined to diagnose that entity.  Moreover, the apparent response to beta-blockers is not consistent with MAT.  I would continue Toprol.  If she has additional episodes of arrhythmia when she is not stressed, the dosage could be increased.  Since she has an [facility] primary care doctor and [facility] cardiologist and all of her records are with that practice, I suggested that she follow up with Dr. Anne Fu or one of his partners.  If [place] can be of any further assistance, we would be happy to become re- involved in her care.     Gerrit Friends. Dietrich Pates, MD, [facility]     RMR/MEDQ  D:  10/09/2010  T:  10/09/2010  Job:  [id number]  Electronically Signed by  Bing MD [facility] on 11/08/2010 10:31:17 AM

## 2010-11-09 NOTE — H&P (Signed)
[name], [name].:  [id number]  MEDICAL RECORD NO.:  [id number]  LOCATION:  [facility]                         FACILITY:  [facility]  PHYSICIAN:  Ramiro Harvest, MD    DATE OF BIRTH:  27-Jan-1931  DATE OF ADMISSION:  10/21/2010 DATE OF DISCHARGE:                             HISTORY & PHYSICAL   PRIMARY CARE PHYSICIAN:  Hessie Diener (C.Alan) Tenny Craw, MD of [facility].  CARDIOLOGIST:  Jake Bathe, MD of [facility].  CHIEF COMPLAINT:  Fall.  HISTORY OF PRESENT ILLNESS:  [name] is a 77 year old Caucasian female history of chronic hyponatremia secondary to psychogenic polydipsia, history of MAT, history of RDS, depression, questionable history of dementia, history of multiple GI issues presented to the ED with fall.  The patient's husband gave most of the history and stated that in the early hours of the morning of admission, the patient got up to go to the bathroom, the patient was half asleep, the patient usually calls the husband when she needs to go to the restroom; however, on this occasion she did not.  He heard her fall and went to pick her up.  When he got there, the patient denies any syncope, no tongue biting or symptoms consistent with any seizure episodes.  The patient was alert. No chest pain, no shortness of breath, no fever, no chills, no nausea, no abdominal pain, no dysuria.  The patient does endorse some generalized weakness and an episode of emesis.  The patient does also endorse chronic constipation.  The patient continued to have a left hip pain and as such the husband brought her to the ED.  The patient was seen in the ED.  X-rays, which were done were consistent with a left femoral neck fracture.  Orthopedics were consult and were called to admit the patient for further evaluation and management.  ALLERGIES:  No known drug allergies.  However, the patient does have intolerances to DEMEROL which causes a rash, HYDROMORPHONE which  causes a rash, CODEINE causes a rash, NOVOCAINE causes a rash, PROCHLORPERAZINE causes a rash and TETANUS TOXOID unknown reaction.  PAST MEDICAL HISTORY: 1. Depression. 2. History of MAT. 3. Normocytic anemia. 4. History of weight loss. 5. History of chronic hyponatremia felt to be secondary to psychogenic     polydipsia. 6. Psychogenic polydipsia. 7. Polymyalgia rheumatica. 8. Irritable bowel syndrome. 9. History of colonic polyps. 10.Status post cholecystectomy. 11.Status post right lung lobectomy secondary to pneumonia. 12.Status post bilateral cataract extraction. 13.Status post excisional breast biopsy for benign disease in 1975. 14.Status post sinus surgery on 2 occasions in the 1980s. 15.History of esophageal stricture. 16.Chronic pain. 17.History of azotemia.  HOME MEDICATIONS:  Please refer to med rec and is currently pending at that time.  SOCIAL HISTORY:  The patient is married, unemployed, lives at home with her husband.  No tobacco use.  No alcohol use.  No IV drug use.  FAMILY HISTORY:  Father deceased at age 4, had a history of coronary artery disease.  Mother deceased at age 68, also had a history of coronary artery disease.  REVIEW OF SYSTEMS:  As per HPI, otherwise negative.  PHYSICAL EXAMINATION:  VITAL SIGNS:  Temperature 97.8, blood pressure 164/88, pulse of 103, respirations 20, satting 97% on room air. GENERAL:  The patient is well-developed, well-nourished female in no acute cardiopulmonary distress. HEENT:  Normocephalic, atraumatic.  Pupils equal, round and reactive light and accommodation.  Extraocular movements intact.  Oropharynx is clear.  No lesions.  No exudates. NECK:  Supple.  No lymphadenopathy. RESPIRATORY:  Lungs are clear to auscultation bilaterally.  No wheezes. No crackles.  No rhonchi. CARDIOVASCULAR:  Irregular rhythm with a normal first and second heart sounds. ABDOMEN:  Soft, nontender, nondistended.  Positive bowel  sounds. EXTREMITIES:  No clubbing, cyanosis or edema.  Left hip is tender to palpation. NEUROLOGIC:  The patient is alert, oriented x3.  Cranial nerves II through XII are grossly intact.  LABORATORY DATA:  Admission labs; PTT 31, PT of 13.0, INR 0.96.  CMET; sodium 117, potassium 3.3, chloride 81, bicarb 24, glucose 121, BUN 3, creatinine 0.49, bilirubin 0.6, alk phosphatase 83, AST 23, ALT 9, protein 6.8, albumin 3.0, calcium of 8.5.  CBC with a white count of 7.1, hemoglobin 11.4, hematocrit 32.1, platelet count of 253 with ANC of 6.4.  Chest x-ray shows cardiomegaly without pulmonary edema.  X-ray of the left hip shows suspected subcapital femoral neck fracture on the left.  EKG shows incomplete right bundle branch block with questionable AFib and some T-wave inversions in leads of V2 through V4, which is unchanged from prior EKG.  ASSESSMENT AND PLAN:  Ms. [name] is 77 year old female history of psychogenic polydipsia with history of chronic hyponatremia, multifocal atrial tachycardia, irritable bowel syndrome, depression presented to the ED with a fall and noted to have a left femoral neck fracture. 1. Left subcapital femoral neck fracture secondary to mechanical fall.     The patient has been seen by Ortho and possible repair tomorrow     depending on the patient's sodium levels.  Pain management will     follow for now and per Orthopedics. 2. Severe hyponatremia secondary to psychogenic polydipsia.  We will     place the patient in the step-down unit for now for closer     observation secondary to the severity of her hyponatremia with a     sodium of 117.  We will place on seizure and fall precautions.     Check a urine and serum osmolality.  Check fractional excretion of     sodium.  Check urine sodium.  We will place on fluid restrictions     at about 1200 mL per day.  We will saline lock IV fluids and will     follow.  We will also check a TSH. 3. Hypokalemia.  Check  a mag level and replete. 4. Irritable bowel syndrome.  Continue home dose of Amitiza as needed. 5. Depression.  Once med rec is done, we will continue home regimen. 6. Chronic pain.  Continue home regimen. 7. Normocytic anemia. 8. Prophylaxis.  Protonix for GI prophylaxis.  Lovenox for DVT     prophylaxis.  It has been a pleasure taking care of Ms. [name].     Ramiro Harvest, MD     DT/MEDQ  D:  10/21/2010  T:  10/21/2010  Job:  [id number]  cc:   Magnus Sinning) Tenny Craw, M.D. Jake Bathe, MD Carlisle Beers. Rendall, M.D.  Electronically Signed by Ramiro Harvest MD on 11/09/2010 12:35:59 PM

## 2010-11-10 LAB — URINE CULTURE: Culture  Setup Time: 201209012101

## 2010-11-16 NOTE — Discharge Summary (Signed)
NAMEBAUDELIA, SCHROEPFER NO.:  [id number]  MEDICAL RECORD NO.:  [id number]  LOCATION:  5025                         FACILITY:  [facility]  PHYSICIAN:  Altha Harm, MDDATE OF BIRTH:  [birth year]  DATE OF ADMISSION:  10/21/2010 DATE OF DISCHARGE:  10/28/2010                              DISCHARGE SUMMARY   DISCHARGE DISPOSITION:  [facility].  FINAL DISCHARGE DIAGNOSES: 1. Left femoral neck fracture status post open reduction and internal     fixation. 2. Chronic hyponatremia. 3. 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 4. Early dementia. 5. Chronic pain. 6. Irritable bowel syndrome. 7. Chronic anemia. 8. Urinary retention, resolved. 9. Multi atrial tachycardia. 10.History of polymyalgia rheumatica, not on steroids at this time. 11.History of esophageal stricture.  DISCHARGE MEDICATIONS:  Include the following, 1. Lovenox 40 mg subcutaneously q.24 h. until therapeutic on Coumadin.2. Robaxin 500 mg p.o. q.6 h. p.r.n. pain. 3. Metoprolol 75 mg p.o. b.i.d. 4. Amitiza 8 mg p.o. b.i.d. as needed for an upset stomach. 5. Norco 10/325 one tab p.o. four times a day as needed for pain. 6. Ocean spray nasal over-the-counter 2 sprays nasally daily as needed     for nasal passages. 7. Protonix 40 mg p.o. daily. 8. Senna 8 mg p.o. daily as needed for constipation. 9. Tylenol Extra Strength 500 mg p.o. q.6 h. p.r.n. pain. 10.Valium 10 mg p.o. at bedtime p.r.n. insomnia. 11.Zofran 4 mg p.o. t.i.d. p.r.n. nausea.  CONSULTATIONS:  Eulas Post, MD, Orthopedic Surgery.  PROCEDURES:  Open reduction and internal fixation of left femoral neck fracture.  DIAGNOSTIC STUDIES: 1. X-ray of the left hip, which shows subcapital femoral fracture on     the left. 2. Two-view chest x-ray, which shows cardiomegaly without pulmonary     edema. 3. Repeat portable pelvic x-ray, which shows ORIF of left hip     fracture. 4. X-ray of the left hip on October 26, 2010, which shows pins in     her left hip appear appropriately positioned and no new     complicating findings observed.  Degenerative arthropathy of both     hips. 5. A 2-D echocardiogram done on August 25, 2010, which shows normal LV     function with ejection fraction of 60% to 65% and Doppler     parameters consistent with grade 2 diastolic dysfunction.  PRIMARY CARE PHYSICIAN:  Harvin Hazel, M.D. [facility]  CARDIOLOGIST:  Jake Bathe, MD, [facility].  CODE STATUS:  Full code.  ALLERGIES:  Are to the following, 1. HYDROMORPHONE. 2. DEMEROL. 3. CODEINE. 4. NOVOCAINE. 5. COMPAZINE. 6. TETANUS TOXOID.  CHIEF COMPLAINT:  Status post mechanical fall.  HISTORY OF PRESENT ILLNESS:  Please refer to the H and P by Dr. Ramiro Harvest for details of the HPI.  In short, this is a 77 year old Caucasian female with a history of chronic hyponatremia who presents to the emergency room after a mechanical fall.  The patient sustained a fracture of the left hip and it was referred to [facility] for further evaluation and management.  HOSPITAL COURSE: 1. Fractured left hip.  The patient was seen in consult by Orthopedic  Surgery who eventually proceeded with a ORIF after several days.     The patient was scheduled to go to surgery.  However, ate without     anyone knowing and surgery had to be cancelled until she is     completely n.p.o.  The patient underwent the ORIF without any     difficulty.  Post surgery, the patient was somewhat lethargic,     however was felt that this was secondary to her medications during     surgery.  She recovered from this without any difficulty. 2. Multi atrial tachycardia.  The patient is typically on a beta-     blocker and had been held in the perioperative period.  The patient     was started back on a beta-blocker when she resumed oral intake.     She still had some relative tachycardia and her beta-blockers were      increased to 50 mg b.i.d. to 75 mg p.o. b.i.d.  She should follow     up with her cardiologist Dr. Donato Schultz upon discharge for further     management of this problem. 3. Urinary retention.  The patient had one episode of urinary     retention with 411 mL of urine retained on bladder scan.  She had     an in and out cath and thereafter was able to urinate on her own     without any difficulty.  Urinalysis was sent off to evaluate for     urinary tract infection as the cause of this and was found to be     negative. 4. Anemia.  The patient's hemoglobin has been stable around 9.6-10. 5. Hyponatremia.  The patient has chronic hyponatremia and this was     felt to be secondary to some psychogenic polydipsia.  The patient's     fluid has been limited and her sodium have remained around 131 with     fluid restriction to 2 L a day. 6. Early dementia.  The patient certainly presents with features of     early dementia and is felt to be incapable of making her own     decisions.  Her husband is her next of kin and healthcare power of     attorney.  Husband was sponsored for making all decisions for her     during this hospitalization.  She has been evaluated by two     physicians, both of whom feel that the patient is not competent to     make her decisions. 7. Mobility.  The patient has been evaluated by physical therapy and     occupational therapy, both of whom have recommended continued     therapy with a skilled facility. 8. Chronic anticoagulation.  The patient actually was not on     anticoagulation prior to hospitalization, which she needs postop     prophylaxis.  Her Coumadin is being titrated and the discharging     physician will need to make a decision on the discharge dose of     Coumadin at the time of discharge.  Meanwhile, the patient should     stay on prophylactic Lovenox until she is therapeutic on her     Coumadin.  DIETARY RESTRICTIONS:  The patient should be on a  heart-healthy diet.  PHYSICAL RESTRICTIONS:  The patient is weightbearing as tolerated under the direction of the physical therapy and occupational therapy.     Altha Harm, MD  MAM/MEDQ  D:  10/27/2010  T:  10/27/2010  Job:  [id number]  cc:   Eulas Post, MD Ramiro Harvest, MD Jake Bathe, MD Magnus Sinning) Tenny Craw, M.D.  Electronically Signed by Marthann Schiller MD on 11/16/2010 07:29:00 AM

## 2010-11-23 LAB — URINALYSIS, ROUTINE W REFLEX MICROSCOPIC
Bilirubin Urine: NEGATIVE
Glucose, UA: NEGATIVE
Hgb urine dipstick: NEGATIVE
Specific Gravity, Urine: 1.009
pH: 6.5

## 2010-11-23 LAB — BASIC METABOLIC PANEL
CO2: 24
Calcium: 9.2
Chloride: 95 — ABNORMAL LOW
Glucose, Bld: 95
Potassium: 3.8
Sodium: 129 — ABNORMAL LOW

## 2010-12-09 NOTE — Discharge Summary (Signed)
  [name], [name] NO.:  [id number]  MEDICAL RECORD NO.:  [id number]  LOCATION:                                 FACILITY:  PHYSICIAN:  Debbora Presto, MD DATE OF BIRTH:  [birth year]  DATE OF ADMISSION:  10/21/2010 DATE OF DISCHARGE:  10/31/2010                              DISCHARGE SUMMARY   ADDENDUM  Please see details in previous discharge summary.  DISCHARGE MEDICATIONS: 1. Robaxin 500 mg tablet by mouth every 6 hours as needed. 2. Toprol-XL 25 mg tablet, take three tablets by mouth twice daily. 3. Coumadin 3 mg tablet by mouth once daily. 4. Amitiza one capsule by mouth twice daily as needed for upset     stomach. 5. Norco 10/325 mg tablet one tablet by mouth four times daily as     needed for pain. 6. Ocean Spray Nasal OTC for dry nasal passages two sprays daily as     needed. 7. Protonix one tablet by mouth daily. 8. Senna 8.6 mg tablet for constipation daily as needed. 9. Tylenol Extra Strength 500 mg tablet for pain every 6 hours as     needed. 10.Valium 10 mg tablet daily at bedtime as needed. 11.Zofran 4 mg tablet as needed for nausea three times daily.  Stop taking following medications; Toprol-XL 50 mg tablet by mouth daily.  DISPOSITION AND FOLLOWUP:  The patient will be discharged to nursing facility for further rehabilitation.  Please note that she is discharged on Coumadin 3 mg tablet.  This will need to be followed up and dose readjusted accordingly based on coags.  In addition, she will require followup electrolyte panel check, BMET to assess the potassium is remaining within normal limits.     Debbora Presto, MD     IM/MEDQ  D:  10/30/2010  T:  10/30/2010  Job:  [id number]  Electronically Signed by Debbora Presto MD on 12/09/2010 10:42:36 PM

## 2011-04-04 DIAGNOSIS — K5900 Constipation, unspecified: Secondary | ICD-10-CM | POA: Diagnosis not present

## 2011-04-04 DIAGNOSIS — IMO0001 Reserved for inherently not codable concepts without codable children: Secondary | ICD-10-CM | POA: Diagnosis not present

## 2011-09-20 DIAGNOSIS — H9190 Unspecified hearing loss, unspecified ear: Secondary | ICD-10-CM | POA: Diagnosis not present

## 2011-09-20 DIAGNOSIS — M2550 Pain in unspecified joint: Secondary | ICD-10-CM | POA: Diagnosis not present

## 2011-09-20 DIAGNOSIS — I10 Essential (primary) hypertension: Secondary | ICD-10-CM | POA: Diagnosis not present

## 2011-09-20 DIAGNOSIS — R21 Rash and other nonspecific skin eruption: Secondary | ICD-10-CM | POA: Diagnosis not present

## 2012-04-24 DIAGNOSIS — I10 Essential (primary) hypertension: Secondary | ICD-10-CM | POA: Diagnosis not present

## 2012-04-24 DIAGNOSIS — IMO0001 Reserved for inherently not codable concepts without codable children: Secondary | ICD-10-CM | POA: Diagnosis not present

## 2012-12-08 DIAGNOSIS — M2550 Pain in unspecified joint: Secondary | ICD-10-CM | POA: Diagnosis not present

## 2012-12-15 DIAGNOSIS — I10 Essential (primary) hypertension: Secondary | ICD-10-CM | POA: Diagnosis not present

## 2012-12-15 DIAGNOSIS — R635 Abnormal weight gain: Secondary | ICD-10-CM | POA: Diagnosis not present

## 2013-01-30 DIAGNOSIS — M159 Polyosteoarthritis, unspecified: Secondary | ICD-10-CM | POA: Diagnosis not present

## 2013-01-30 DIAGNOSIS — IMO0001 Reserved for inherently not codable concepts without codable children: Secondary | ICD-10-CM | POA: Diagnosis not present

## 2013-01-30 DIAGNOSIS — F411 Generalized anxiety disorder: Secondary | ICD-10-CM | POA: Diagnosis not present

## 2013-01-30 DIAGNOSIS — R141 Gas pain: Secondary | ICD-10-CM | POA: Diagnosis not present

## 2013-03-24 IMAGING — CR DG ABDOMEN 2V
3 series · 3 of 3 positions shown · non-contrast
Comparison: 11/29/2004

CLINICAL DATA: Nausea, vomiting.

ABDOMEN - 2 VIEW

[w abdomen decub * (1 of 2)]
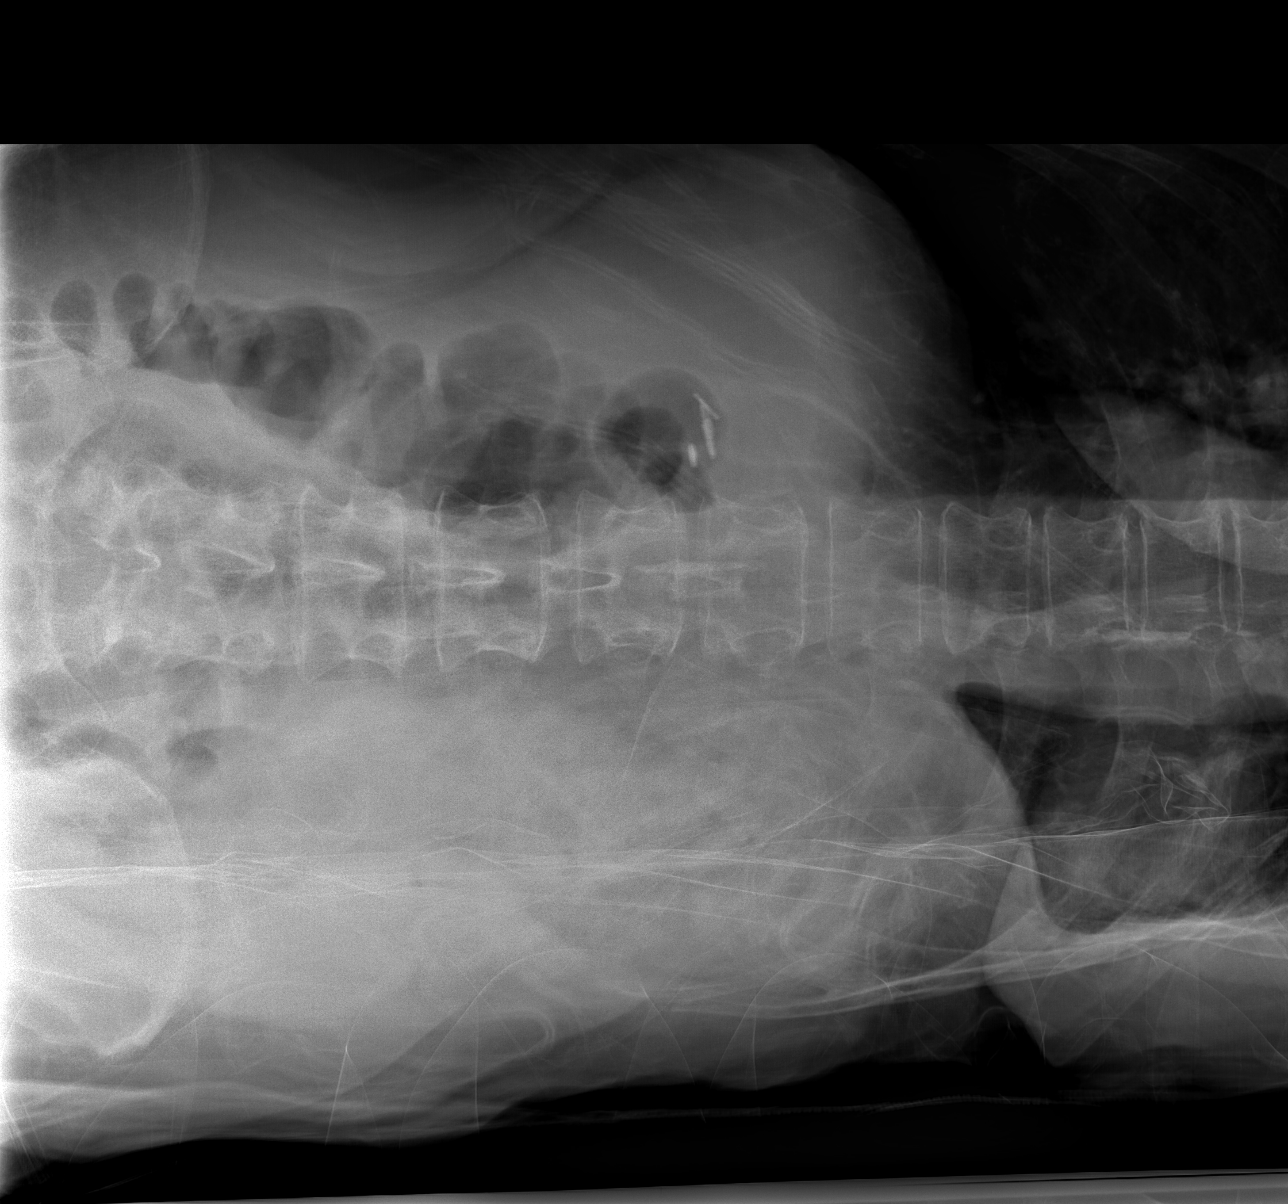

[w abdomen decub * (2 of 2)]
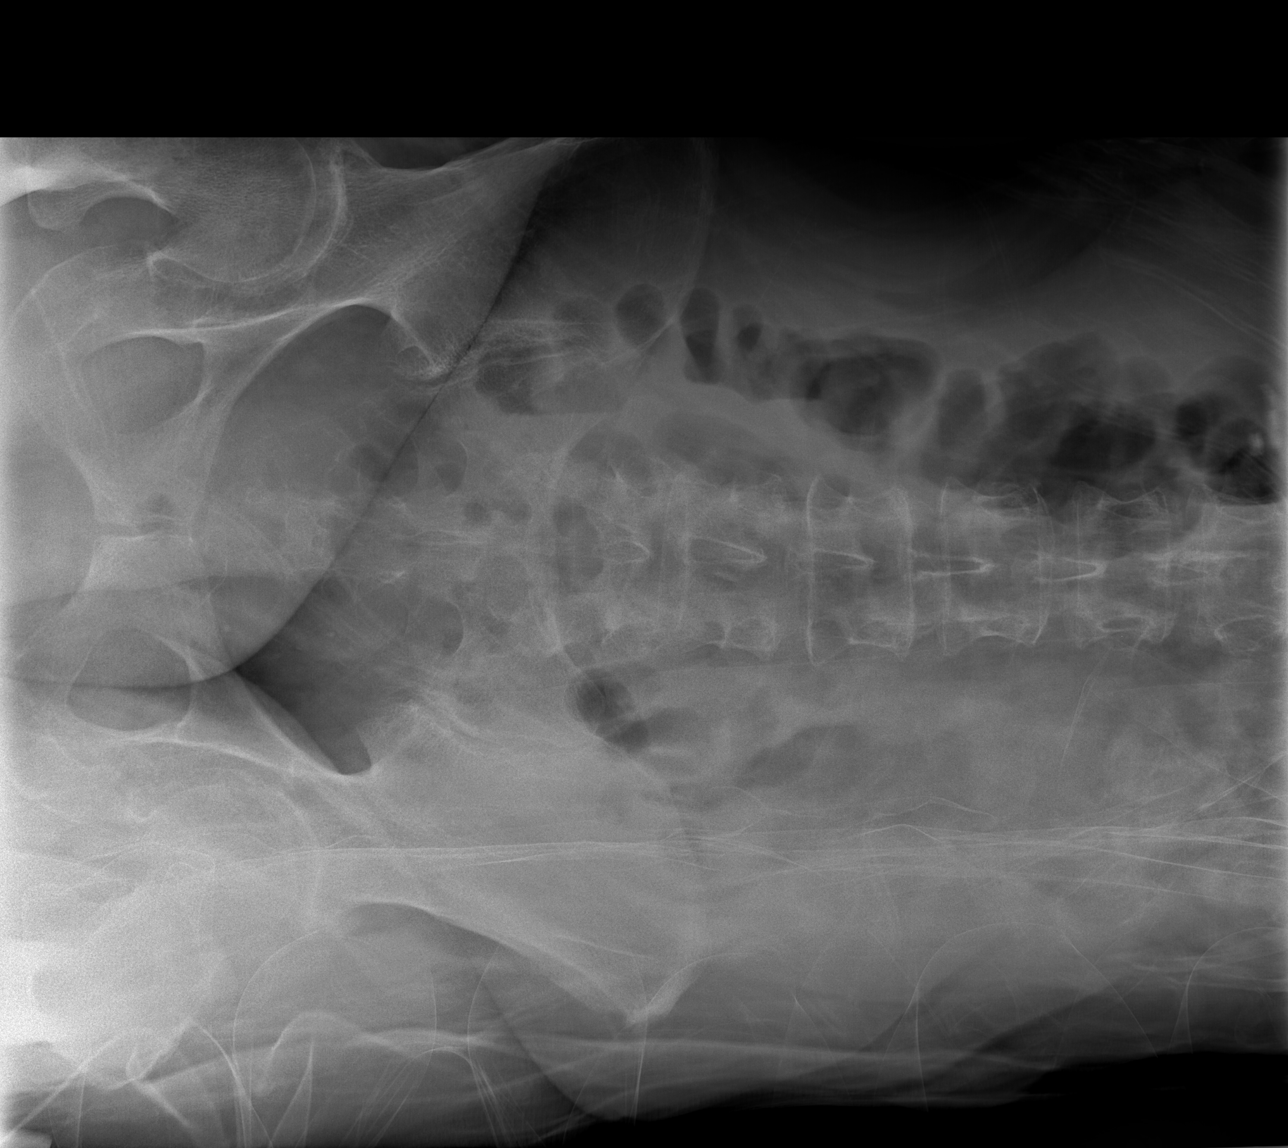

[view not recorded]
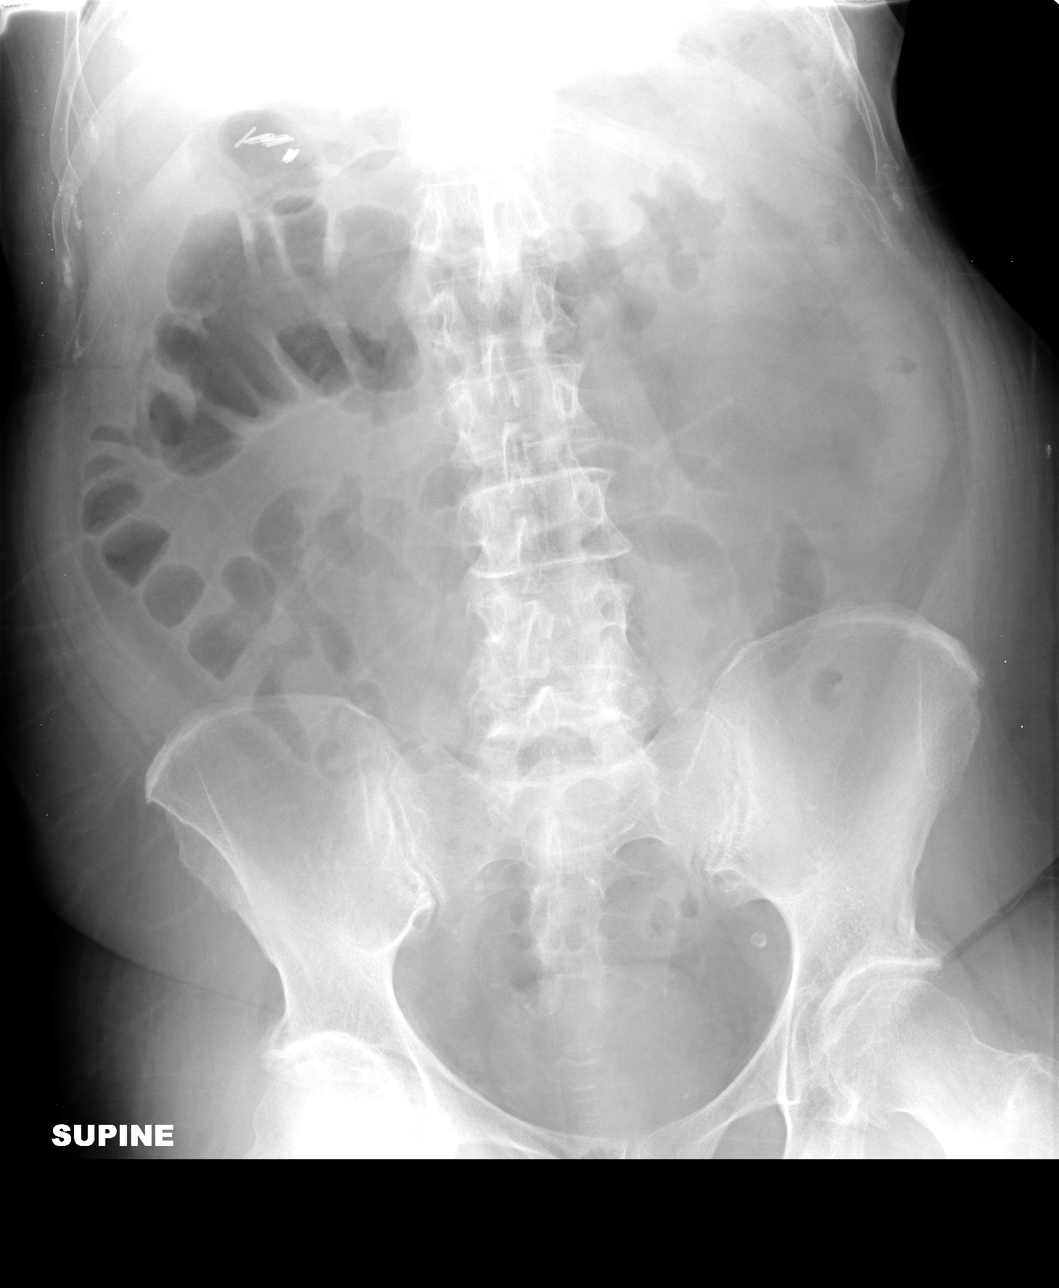

[3 of 3 positions shown; findings below may reference images not displayed]

FINDINGS: Prior cholecystectomy.  There is a nonobstructive bowel
gas pattern.  No free air.  No organomegaly or suspicious
calcification.
IMPRESSION: No obstruction or free air.  Prior cholecystectomy.

## 2013-11-20 DIAGNOSIS — F411 Generalized anxiety disorder: Secondary | ICD-10-CM | POA: Diagnosis not present

## 2013-11-20 DIAGNOSIS — H9190 Unspecified hearing loss, unspecified ear: Secondary | ICD-10-CM | POA: Diagnosis not present

## 2013-11-20 DIAGNOSIS — L03019 Cellulitis of unspecified finger: Secondary | ICD-10-CM | POA: Diagnosis not present

## 2013-11-20 DIAGNOSIS — G4700 Insomnia, unspecified: Secondary | ICD-10-CM | POA: Diagnosis not present

## 2013-11-20 DIAGNOSIS — I10 Essential (primary) hypertension: Secondary | ICD-10-CM | POA: Diagnosis not present

## 2014-06-16 DIAGNOSIS — B353 Tinea pedis: Secondary | ICD-10-CM | POA: Diagnosis not present

## 2014-06-30 DIAGNOSIS — B353 Tinea pedis: Secondary | ICD-10-CM | POA: Diagnosis not present

## 2014-06-30 DIAGNOSIS — L739 Follicular disorder, unspecified: Secondary | ICD-10-CM | POA: Diagnosis not present

## 2014-07-25 DIAGNOSIS — R61 Generalized hyperhidrosis: Secondary | ICD-10-CM | POA: Diagnosis not present

## 2014-07-30 ENCOUNTER — Inpatient Hospital Stay (HOSPITAL_COMMUNITY): Payer: Medicare Other

## 2014-07-30 ENCOUNTER — Emergency Department (HOSPITAL_COMMUNITY): Payer: Medicare Other

## 2014-07-30 ENCOUNTER — Inpatient Hospital Stay (HOSPITAL_COMMUNITY)
Admission: EM | Admit: 2014-07-30 | Discharge: 2014-07-31 | DRG: 292 | Disposition: A | Discharge status: Home / Self Care | Payer: Medicare Other | Attending: Internal Medicine | Admitting: Internal Medicine

## 2014-07-30 ENCOUNTER — Encounter (HOSPITAL_COMMUNITY): Payer: Self-pay | Admitting: *Deleted

## 2014-07-30 DIAGNOSIS — Z881 Allergy status to other antibiotic agents status: Secondary | ICD-10-CM | POA: Diagnosis not present

## 2014-07-30 DIAGNOSIS — F0390 Unspecified dementia without behavioral disturbance: Secondary | ICD-10-CM | POA: Diagnosis present

## 2014-07-30 DIAGNOSIS — J810 Acute pulmonary edema: Secondary | ICD-10-CM

## 2014-07-30 DIAGNOSIS — Z902 Acquired absence of lung [part of]: Secondary | ICD-10-CM | POA: Diagnosis present

## 2014-07-30 DIAGNOSIS — I471 Supraventricular tachycardia: Secondary | ICD-10-CM | POA: Diagnosis present

## 2014-07-30 DIAGNOSIS — I509 Heart failure, unspecified: Secondary | ICD-10-CM

## 2014-07-30 DIAGNOSIS — L089 Local infection of the skin and subcutaneous tissue, unspecified: Secondary | ICD-10-CM | POA: Diagnosis present

## 2014-07-30 DIAGNOSIS — J9601 Acute respiratory failure with hypoxia: Secondary | ICD-10-CM | POA: Diagnosis not present

## 2014-07-30 DIAGNOSIS — Z79899 Other long term (current) drug therapy: Secondary | ICD-10-CM

## 2014-07-30 DIAGNOSIS — Z887 Allergy status to serum and vaccine status: Secondary | ICD-10-CM

## 2014-07-30 DIAGNOSIS — Z888 Allergy status to other drugs, medicaments and biological substances status: Secondary | ICD-10-CM | POA: Diagnosis not present

## 2014-07-30 DIAGNOSIS — R0600 Dyspnea, unspecified: Secondary | ICD-10-CM | POA: Insufficient documentation

## 2014-07-30 DIAGNOSIS — R7989 Other specified abnormal findings of blood chemistry: Secondary | ICD-10-CM | POA: Diagnosis present

## 2014-07-30 DIAGNOSIS — R0602 Shortness of breath: Secondary | ICD-10-CM | POA: Diagnosis not present

## 2014-07-30 DIAGNOSIS — I10 Essential (primary) hypertension: Secondary | ICD-10-CM | POA: Diagnosis present

## 2014-07-30 DIAGNOSIS — R0902 Hypoxemia: Secondary | ICD-10-CM | POA: Diagnosis present

## 2014-07-30 DIAGNOSIS — H9190 Unspecified hearing loss, unspecified ear: Secondary | ICD-10-CM | POA: Diagnosis present

## 2014-07-30 DIAGNOSIS — Z9049 Acquired absence of other specified parts of digestive tract: Secondary | ICD-10-CM | POA: Diagnosis present

## 2014-07-30 DIAGNOSIS — Z8701 Personal history of pneumonia (recurrent): Secondary | ICD-10-CM | POA: Diagnosis not present

## 2014-07-30 DIAGNOSIS — D508 Other iron deficiency anemias: Secondary | ICD-10-CM | POA: Diagnosis not present

## 2014-07-30 DIAGNOSIS — K589 Irritable bowel syndrome without diarrhea: Secondary | ICD-10-CM | POA: Diagnosis present

## 2014-07-30 DIAGNOSIS — I5033 Acute on chronic diastolic (congestive) heart failure: Secondary | ICD-10-CM | POA: Diagnosis not present

## 2014-07-30 DIAGNOSIS — I4510 Unspecified right bundle-branch block: Secondary | ICD-10-CM | POA: Diagnosis present

## 2014-07-30 DIAGNOSIS — I5031 Acute diastolic (congestive) heart failure: Secondary | ICD-10-CM | POA: Diagnosis not present

## 2014-07-30 DIAGNOSIS — M797 Fibromyalgia: Secondary | ICD-10-CM | POA: Diagnosis present

## 2014-07-30 DIAGNOSIS — Z885 Allergy status to narcotic agent status: Secondary | ICD-10-CM | POA: Diagnosis not present

## 2014-07-30 DIAGNOSIS — F05 Delirium due to known physiological condition: Secondary | ICD-10-CM | POA: Diagnosis present

## 2014-07-30 DIAGNOSIS — D649 Anemia, unspecified: Secondary | ICD-10-CM | POA: Diagnosis present

## 2014-07-30 DIAGNOSIS — D509 Iron deficiency anemia, unspecified: Secondary | ICD-10-CM | POA: Diagnosis present

## 2014-07-30 DIAGNOSIS — R069 Unspecified abnormalities of breathing: Secondary | ICD-10-CM | POA: Diagnosis not present

## 2014-07-30 LAB — BASIC METABOLIC PANEL
Anion gap: 8 (ref 5–15)
BUN: 9 mg/dL (ref 6–20)
CO2: 26 mmol/L (ref 22–32)
Calcium: 8.7 mg/dL — ABNORMAL LOW (ref 8.9–10.3)
Chloride: 99 mmol/L — ABNORMAL LOW (ref 101–111)
Creatinine, Ser: 0.86 mg/dL (ref 0.44–1.00)
GFR calc Af Amer: >60 mL/min (ref >60)
Glucose, Bld: 115 mg/dL — ABNORMAL HIGH (ref 65–99)
POTASSIUM: 4.3 mmol/L (ref 3.5–5.1)
SODIUM: 133 mmol/L — AB (ref 135–145)

## 2014-07-30 LAB — CBC WITH DIFFERENTIAL/PLATELET
BASOS ABS: 0.1 10*3/uL (ref 0.0–0.1)
Basophils Relative: 1 % (ref 0–1)
Eosinophils Absolute: 0.7 10*3/uL (ref 0.0–0.7)
Eosinophils Relative: 7 % — ABNORMAL HIGH (ref 0–5)
HCT: 30.1 % — ABNORMAL LOW (ref 36.0–46.0)
HEMOGLOBIN: 8.7 g/dL — AB (ref 12.0–15.0)
LYMPHS PCT: 13 % (ref 12–46)
Lymphs Abs: 1.4 10*3/uL (ref 0.7–4.0)
MCH: 18.6 pg — ABNORMAL LOW (ref 26.0–34.0)
MCHC: 28.9 g/dL — ABNORMAL LOW (ref 30.0–36.0)
MCV: 64.3 fL — ABNORMAL LOW (ref 78.0–100.0)
MONOS PCT: 10 % (ref 3–12)
Monocytes Absolute: 1.1 10*3/uL — ABNORMAL HIGH (ref 0.1–1.0)
NEUTROS ABS: 7.2 10*3/uL (ref 1.7–7.7)
NEUTROS PCT: 69 % (ref 43–77)
Platelets: 324 10*3/uL (ref 150–400)
RBC: 4.68 MIL/uL (ref 3.87–5.11)
RDW: 19.3 % — ABNORMAL HIGH (ref 11.5–15.5)
WBC: 10.5 10*3/uL (ref 4.0–10.5)

## 2014-07-30 LAB — URINALYSIS, ROUTINE W REFLEX MICROSCOPIC
Bilirubin Urine: NEGATIVE
Glucose, UA: NEGATIVE mg/dL
Hgb urine dipstick: NEGATIVE
KETONES UR: NEGATIVE mg/dL
LEUKOCYTES UA: NEGATIVE
NITRITE: NEGATIVE
PH: 7 (ref 5.0–8.0)
PROTEIN: NEGATIVE mg/dL
Specific Gravity, Urine: 1.011 (ref 1.005–1.030)
UROBILINOGEN UA: 0.2 mg/dL (ref 0.0–1.0)

## 2014-07-30 LAB — RETICULOCYTES
RBC.: 4.66 MIL/uL (ref 3.87–5.11)
Retic Count, Absolute: 88.5 10*3/uL (ref 19.0–186.0)
Retic Ct Pct: 1.9 % (ref 0.4–3.1)

## 2014-07-30 LAB — PROTIME-INR
INR: 1.08 (ref 0.00–1.49)
Prothrombin Time: 14.2 seconds (ref 11.6–15.2)

## 2014-07-30 LAB — TROPONIN I
TROPONIN I: 0.06 ng/mL — AB (ref <0.031)
Troponin I: 0.04 ng/mL — ABNORMAL HIGH (ref <0.031)
Troponin I: 0.05 ng/mL — ABNORMAL HIGH (ref <0.031)

## 2014-07-30 LAB — HEPATIC FUNCTION PANEL
ALT: 12 U/L — ABNORMAL LOW (ref 14–54)
AST: 23 U/L (ref 15–41)
Albumin: 3.4 g/dL — ABNORMAL LOW (ref 3.5–5.0)
Alkaline Phosphatase: 77 U/L (ref 38–126)
Total Bilirubin: 0.6 mg/dL (ref 0.3–1.2)
Total Protein: 6.6 g/dL (ref 6.5–8.1)

## 2014-07-30 LAB — I-STAT TROPONIN, ED: Troponin i, poc: 0.01 ng/mL (ref 0.00–0.08)

## 2014-07-30 LAB — IRON AND TIBC
IRON: 15 ug/dL — AB (ref 28–170)
Saturation Ratios: 3 % — ABNORMAL LOW (ref 10.4–31.8)
TIBC: 557 ug/dL — ABNORMAL HIGH (ref 250–450)
UIBC: 542 ug/dL

## 2014-07-30 LAB — STREP PNEUMONIAE URINARY ANTIGEN: STREP PNEUMO URINARY ANTIGEN: NEGATIVE

## 2014-07-30 LAB — BRAIN NATRIURETIC PEPTIDE: B Natriuretic Peptide: 138.7 pg/mL — ABNORMAL HIGH (ref 0.0–100.0)

## 2014-07-30 LAB — TSH: TSH: 6.916 u[IU]/mL — AB (ref 0.350–4.500)

## 2014-07-30 MED ORDER — LORAZEPAM 2 MG/ML IJ SOLN
0.5000 mg | Freq: Once | INTRAMUSCULAR | Status: AC
Start: 2014-07-30 — End: 2014-07-30
  Administered 2014-07-30: 0.5 mg via INTRAVENOUS
  Filled 2014-07-30: qty 1

## 2014-07-30 MED ORDER — ACETAMINOPHEN 325 MG PO TABS: 650.0000 mg | ORAL_TABLET | ORAL | Status: DC | PRN

## 2014-07-30 MED ORDER — ENOXAPARIN SODIUM 40 MG/0.4ML ~~LOC~~ SOLN
40.0000 mg | Freq: Every day | SUBCUTANEOUS | Status: DC
  Administered 2014-07-30 – 2014-07-31 (×2): 40 mg via SUBCUTANEOUS
  Filled 2014-07-30 (×2): qty 0.4

## 2014-07-30 MED ORDER — LISINOPRIL 5 MG PO TABS
5.0000 mg | ORAL_TABLET | Freq: Every day | ORAL | Status: DC
  Administered 2014-07-30: 5 mg via ORAL
  Filled 2014-07-30: qty 1

## 2014-07-30 MED ORDER — SODIUM CHLORIDE 0.9 % IJ SOLN: 3.0000 mL | INTRAMUSCULAR | Status: DC | PRN

## 2014-07-30 MED ORDER — METOPROLOL TARTRATE 25 MG PO TABS
25.0000 mg | ORAL_TABLET | Freq: Two times a day (BID) | ORAL | Status: DC
Start: 2014-07-30 — End: 2014-07-31
  Administered 2014-07-30 (×2): 25 mg via ORAL
  Filled 2014-07-30 (×4): qty 1

## 2014-07-30 MED ORDER — LISINOPRIL 10 MG PO TABS
10.0000 mg | ORAL_TABLET | Freq: Every day | ORAL | Status: DC
  Filled 2014-07-30: qty 1

## 2014-07-30 MED ORDER — HYDROXYZINE HCL 25 MG PO TABS
25.0000 mg | ORAL_TABLET | Freq: Three times a day (TID) | ORAL | Status: DC | PRN
  Administered 2014-07-30 – 2014-07-31 (×3): 25 mg via ORAL
  Filled 2014-07-30 (×3): qty 1

## 2014-07-30 MED ORDER — FUROSEMIDE 10 MG/ML IJ SOLN
40.0000 mg | Freq: Once | INTRAMUSCULAR | Status: AC
  Administered 2014-07-30: 40 mg via INTRAVENOUS
  Filled 2014-07-30: qty 4

## 2014-07-30 MED ORDER — ESCITALOPRAM OXALATE 10 MG PO TABS
10.0000 mg | ORAL_TABLET | Freq: Every day | ORAL | Status: DC
  Administered 2014-07-31: 10 mg via ORAL
  Filled 2014-07-30 (×2): qty 1

## 2014-07-30 MED ORDER — IPRATROPIUM-ALBUTEROL 0.5-2.5 (3) MG/3ML IN SOLN
3.0000 mL | Freq: Once | RESPIRATORY_TRACT | Status: AC
  Administered 2014-07-30: 3 mL via RESPIRATORY_TRACT
  Filled 2014-07-30: qty 3

## 2014-07-30 MED ORDER — FUROSEMIDE 40 MG PO TABS
40.0000 mg | ORAL_TABLET | Freq: Every day | ORAL | Status: DC
  Administered 2014-07-30: 40 mg via ORAL
  Filled 2014-07-30 (×2): qty 1

## 2014-07-30 MED ORDER — ONDANSETRON HCL 4 MG/2ML IJ SOLN: 4.0000 mg | Freq: Four times a day (QID) | INTRAMUSCULAR | Status: DC | PRN

## 2014-07-30 MED ORDER — ASPIRIN EC 81 MG PO TBEC
81.0000 mg | DELAYED_RELEASE_TABLET | Freq: Every day | ORAL | Status: DC
  Administered 2014-07-30 – 2014-07-31 (×2): 81 mg via ORAL
  Filled 2014-07-30 (×2): qty 1

## 2014-07-30 MED ORDER — TRAMADOL HCL 50 MG PO TABS
50.0000 mg | ORAL_TABLET | Freq: Three times a day (TID) | ORAL | Status: DC | PRN
  Administered 2014-07-30: 100 mg via ORAL
  Filled 2014-07-30: qty 2

## 2014-07-30 MED ORDER — SODIUM CHLORIDE 0.9 % IV SOLN: 250.0000 mL | INTRAVENOUS | Status: DC | PRN

## 2014-07-30 MED ORDER — SODIUM CHLORIDE 0.9 % IJ SOLN
3.0000 mL | Freq: Two times a day (BID) | INTRAMUSCULAR | Status: DC
  Administered 2014-07-30: 3 mL via INTRAVENOUS

## 2014-07-30 NOTE — H&P (Addendum)
[facility] History and Physical  Patient: [name]  MRN: [id number]  DOB: December 21, 1930  DOS: the patient was seen and examined on 07/30/2014 PCP:  Melinda Crutch, MD  Referring physician: Dr. Sharol Given Chief Complaint: Shortness of breath  HPI: [name] is a 81 y.o. female with Past medical history of dementia, anemia. The patient is presenting with complains of shortness of breath that started tonight.  The patient initially had some shortness of breath on "Sunday and EMS was called she was found to be hypertensive and was requested to go to ER but she refused. Today again she had shortness of breath and called EMS and was found to be significantly hypertensive. Her blood pressure was in 200s systolic. She was significantly hypoxic and tachypneic. She was placed on C Pap and then was brought here. The history was taken from patient's husband mentions there was no fever no chills no chest pain no nausea no vomiting no choking no diarrhea no constipation. No recent change in her medications. She did not have any issues with her blood pressure. Since last 2 weeks she has been having increasing shortness of breath on exertion and will get tired easily. She has developed a skin infection which has been treated locally.  The patient is coming from home. And at her baseline independent for most of her ADL.  Review of Systems: as mentioned in the history of present illness.  A comprehensive review of the other systems is negative.  Past Medical History  Diagnosis Date  . Fractured hip 10/2010    fractured left hip, L THR   . On prednisone therapy 2008-2010    Continuous prednisone therapy   . PNA (pneumonia)     w/lung abscess at 15 months of age  . IBS (irritable bowel syndrome)     IBS-constipation  . Palpitations     paroxysmal atrial tachycardia, EF normal, diastolic dysfunction-echo/Holter 10/10 (she did not wish to take medications prescribed, metoprolol/HCTZ)  .  Fibromyalgia     jt pains, Tramadol Miller 0-4 per day    Past Surgical History  Procedure Laterality Date  . Lobectomy  1956    lung, partial   . Cholecystectomy  1998  . Nasal sinus surgery  1982  . Cyst excision      breast cyst removed   . Colonoscopy      20" 05 wnl per old records   Social History:  reports that she has never smoked. She has never used smokeless tobacco. She reports that she does not drink alcohol. Her drug history is not on file.  Allergies  Allergen Reactions  . Compazine [Prochlorperazine Edisylate]     could not talk or hold head up  . Demerol [Meperidine]     itch, nasal cong  . Dilaudid [Hydromorphone Hcl]     itch  . Novocain [Procaine]     heart races  . Other     "some antibiotic": muscle flexion Tetanus-Diphth-Acell Pertussis: left side numb    Family History  Problem Relation Age of Onset  . Heart attack Mother   . Lupus Paternal Grandmother     Prior to Admission medications   Medication Sig Start Date End Date Taking? Authorizing Provider  escitalopram (LEXAPRO) 10 MG tablet Take 10 mg by mouth daily.   Yes Historical Provider, MD  hydrOXYzine (ATARAX/VISTARIL) 25 MG tablet Take 25 mg by mouth 3 (three) times daily as needed for anxiety. HydrOXYzine HCl 25 Tablet 1/2 to 1 tablet Three  times a day Orally 30 days   Yes Historical Provider, MD  PRESCRIPTION MEDICATION Apply 1 application topically daily. Steroid cream for rash on foot.   Yes Historical Provider, MD  traMADol (ULTRAM) 50 MG tablet Take 50-100 mg by mouth 3 (three) times daily as needed for moderate pain.    Yes Historical Provider, MD    Physical Exam: Filed Vitals:   07/30/14 0321 07/30/14 0415 07/30/14 0500 07/30/14 0559  BP: 148/72 128/52 151/54 159/73  Pulse: 97 90 86 96  Temp:    98.8 F (37.1 C)  TempSrc:      Resp: 15 17 17 16   Height:    5\' 2"  (1.575 m)  Weight:    63.322 kg (139 lb 9.6 oz)  SpO2: 100% 100% 100% 95%    General: Alert, Awake and Oriented  to Time, Place and Person. Appear in mild distress Eyes: PERRL ENT: Oral Mucosa clear moist. Neck: Mild JVD Cardiovascular: S1 and S2 Present, no Murmur, Peripheral Pulses Present Respiratory: Bilateral Air entry equal and Decreased,  Bilateral Crackles, no wheezes Abdomen: Bowel Sound present, Soft and non tender Skin: no Rash Extremities: Trace Pedal edema, no calf tenderness Neurologic: Grossly no focal neuro deficit.  Labs on Admission:  CBC:  Recent Labs Lab 07/30/14 0234  WBC 10.5  NEUTROABS 7.2  HGB 8.7*  HCT 30.1*  MCV 64.3*  PLT 324    CMP     Component Value Date/Time   NA 133* 07/30/2014 0234   K 4.3 07/30/2014 0234   CL 99* 07/30/2014 0234   CO2 26 07/30/2014 0234   GLUCOSE 115* 07/30/2014 0234   BUN 9 07/30/2014 0234   CREATININE 0.86 07/30/2014 0234   CALCIUM 8.7* 07/30/2014 0234   PROT 6.8 10/21/2010 1445   ALBUMIN 3.0* 10/21/2010 1445   AST 23 10/21/2010 1445   ALT 9 10/21/2010 1445   ALKPHOS 83 10/21/2010 1445   BILITOT 0.6 10/21/2010 1445   GFRNONAA >60 07/30/2014 0234   GFRAA >60 07/30/2014 0234    No results for input(s): LIPASE, AMYLASE in the last 168 hours.   Recent Labs Lab 07/30/14 0457  TROPONINI 0.04*   BNP (last 3 results)  Recent Labs  07/30/14 0456  BNP 138.7*    ProBNP (last 3 results) No results for input(s): PROBNP in the last 8760 hours.   Radiological Exams on Admission: Dg Chest Port 1 View  07/30/2014   CLINICAL DATA:  Acute onset of shortness of breath. Initial encounter.  EXAM: PORTABLE CHEST - 1 VIEW  COMPARISON:  Chest radiograph performed 10/21/2010  FINDINGS: The lungs are well-aerated. Vascular congestion is noted. Mild bibasilar opacities could reflect mild interstitial edema or pneumonia. A small left pleural effusion is suspected. No pneumothorax is seen.  The cardiomediastinal silhouette is enlarged. No acute osseous abnormalities are seen.  IMPRESSION: Vascular congestion and cardiomegaly. Mild  bibasilar opacities could reflect mild interstitial edema or pneumonia. Suspect small left pleural effusion.   Electronically Signed   By: Garald Balding M.D.   On: 07/30/2014 03:01   EKG: Independently reviewed. sinus tachycardia.  Assessment/Plan Principal Problem:   Acute CHF Active Problems:   Accelerated hypertension   Anemia   Dementia   Acute confusional state   1. Acute CHF The patient is presenting with complaints of shortness of breath with hypertension. Most likely she has flash pulmonary edema secondary to acute accelerated hypertension. She has received IV Lasix and is currently responding to it. Her shortness of breath is  improved significantly. Chest x-ray shows congestion but she her BNP is not significantly elevated. With this I will rule out ACS with serial troponin and get an echo program in the morning. At her on aspirin, Lopressor, lisinopril. Monitor on telemetry for any arrhythmia.  Check sputum and urine antigen.  2. Accelerated hypertension. Adding IV hydralazine as needed, Lopressor and lisinopril. The patient at present improved significantly.  3. Anemia. No active bleeding. Continue close monitoring.  4. Dementia with acute confusional state. Probably secondary to hypoxia. We will check serial neuro checks at present she does not have any focal deficit.  Advance goals of care discussion: Full code   DVT Prophylaxis: subcutaneous Heparin Nutrition: Nothing by mouth  Family Communication: family was present at bedside, opportunity was given to ask question and all questions were answered satisfactorily at the time of interview. Disposition: Admitted as inpatient, telemetry unit.  Author: Berle Mull, MD [facility] Pager: [phone] 07/30/2014  If 7PM-7AM, please contact night-coverage [email or web address] Password [email or web address]

## 2014-07-30 NOTE — Progress Notes (Addendum)
Patient has no IV access at this time.  Refuses IV placement.  Notified T. Rogue Bussing NP.  No new orders at this time.  Will continue to monitor.

## 2014-07-30 NOTE — ED Notes (Signed)
Pt. SOB has been going on for 2 days. Every time EMS is called to pt. Home pt. Is SOB, hypertensive, tachycardic, and anxious. Pt. Kept refusing to go to the hospital. Pt. Husband convinced pt. To come to hospital today.

## 2014-07-30 NOTE — Progress Notes (Signed)
  Echocardiogram 2D Echocardiogram has been performed.  [name] 07/30/2014, 10:11 AM

## 2014-07-30 NOTE — Consult Note (Signed)
CARDIOLOGY CONSULT NOTE   Patient ID: [name] MRN: [id number], DOB/AGE: 79/20/1932   Admit date: 07/30/2014 Date of Consult: 07/30/2014   Primary Physician:  Melinda Crutch, MD Primary Cardiologist:   Pt. Profile  81 year old Caucasian female with past medical history of fibromyalgia, history of paroxysmal atrial tachycardia with PACs and h/o remote pleural abscess s/p LLL lobectomy present with dyspnea and hypertensive urgency  Problem List  Past Medical History  Diagnosis Date  . Fractured hip 10/2010    fractured left hip, L THR   . On prednisone therapy 2008-2010    Continuous prednisone therapy   . PNA (pneumonia)     w/lung abscess at 69 months of age  . IBS (irritable bowel syndrome)     IBS-constipation  . Palpitations     paroxysmal atrial tachycardia, EF normal, diastolic dysfunction-echo/Holter 10/10 (she did not wish to take medications prescribed, metoprolol/HCTZ)  . Fibromyalgia     jt pains, Tramadol Miller 0-4 per day     Past Surgical History  Procedure Laterality Date  . Lobectomy  1956    lung, partial   . Cholecystectomy  1998  . Nasal sinus surgery  1982  . Cyst excision      breast cyst removed   . Colonoscopy      2005 wnl per old records     Allergies  Allergies  Allergen Reactions  . Compazine [Prochlorperazine Edisylate]     could not talk or hold head up  . Demerol [Meperidine]     itch, nasal cong  . Dilaudid [Hydromorphone Hcl]     itch  . Novocain [Procaine]     heart races  . Other     "some antibiotic": muscle flexion Tetanus-Diphth-Acell Pertussis: left side numb    HPI   The patient is a 81 year old Caucasian female with past medical history of fibromyalgia, history of paroxysmal atrial tachycardia with PACs and h/o remote pleural abscess s/p LLL lobectomy. According to her husband, she has possibly seen Dr. Marlou Porch in the past. Her records shows that she was placed on metoprolol and HCTZ at one point, however she  refused to take the medication. She has not seen by cardiology since 2010. Of note, patient is very hard of hearing and has some degree of dementia making obtaining HPI very difficult. According to the husband, she never had any ischemic workup in the past. She denies any prior history of chest pain either. The only time she had chest pain was after she had left-sided lower lobe lobectomy in the past for pleural abscess as result of chronic pneumonia. She seldomly ever leave the house unless absolute necessity, therefore, her functional status is very poor.  She never had a diagnosis of hypertension in the past and does not take any blood pressure medication at home. Per husband, she was noted to have increasing shortness of breath for the past several weeks. However the first time she was diagnosed with hypertension was last weekend when she had significant paroxysmal nocturnal dyspnea. EMS was called at the time, and she was noted to have a systolic blood pressure of over 200. Unfortunately, despite EMS recommending to take her to the local ED, she refused. She went to sleep on the night of 07/29/2014 feeling okay. Around 1 AM, she had significant shortness of breath that woke her up from sleep. She asked her husband to contact EMS. She was taken to [facility] for further evaluation. She was noted to have significant  hypertension. Chest x-ray was consistent with vascular congestion and mild interstitial edema. She was given 40 mg IV Lasix in the ED and switched to 40 mg by mouth Lasix. Other significant lab on arrival also include microcytic anemia, although patient does not have obvious bleeding source. Her TSH was also elevated at 6 and she does not have a history of hypothyroidism. The patient was admitted to the internal medicine service. Overnight serial troponin was borderline. Patient at this time denies any recent chest discomfort. Her shortness breath has improved after IV diuresis. She was  placed on metoprolol and lisinopril for blood pressure control. Cardiology has been consulted for acute on chronic diastolic heart failure. Of note, echocardiogram was obtained in the morning of 07/30/2014 which showed EF 40-34%, grade 2 diastolic dysfunction, no regional wall motion abnormality, mild TR/MR, RVSP 36.   Inpatient Medications  . aspirin EC  81 mg Oral Daily  . enoxaparin (LOVENOX) injection  40 mg Subcutaneous Daily  . escitalopram  10 mg Oral Daily  . furosemide  40 mg Oral Daily  . lisinopril  5 mg Oral Daily  . metoprolol tartrate  25 mg Oral BID  . sodium chloride  3 mL Intravenous Q12H    Family History Family History  Problem Relation Age of Onset  . Heart attack Mother   . Lupus Paternal Grandmother      Social History History   Social History  . Marital Status: Married    Spouse Name: N/A  . Number of Children: N/A  . Years of Education: N/A   Occupational History  . Not on file.   Social History Main Topics  . Smoking status: Never Smoker   . Smokeless tobacco: Never Used  . Alcohol Use: No  . Drug Use: Not on file  . Sexual Activity: Not on file   Other Topics Concern  . Not on file   Social History Narrative     Review of Systems  General:  No chills, fever, night sweats or weight changes.  Cardiovascular:  No chest pain, dyspnea on exertion, edema, palpitations +orthopnea, paroxysmal nocturnal dyspnea. Dermatological: No rash, lesions/masses Respiratory: No cough +dyspnea Urologic: No hematuria, dysuria Abdominal:   No nausea, vomiting, diarrhea, bright red blood per rectum, melena, or hematemesis Neurologic:  No visual changes, wkns, changes in mental status. All other systems reviewed and are otherwise negative except as noted above.  Physical Exam  Blood pressure 145/77, pulse 96, temperature 98.2 F (36.8 C), temperature source Oral, resp. rate 16, height 5\' 2"  (1.575 m), weight 139 lb 9.6 oz (63.322 kg), SpO2 98 %.  General:  Pleasant, NAD Psych: Normal affect. Neuro: Alert and oriented X 3. Moves all extremities spontaneously. HEENT: Normal  Neck: Supple without bruits or JVD. Lungs:  Resp regular and unlabored. Markedly decreased breath sound in LLL, otherwise CTA Heart: irregular. no s3, s4. Mild 1/6 systolic murmur Abdomen: Soft, non-tender, non-distended, BS + x 4.  Extremities: No clubbing, cyanosis or edema. DP/PT/Radials 2+ and equal bilaterally.  Labs   Recent Labs  07/30/14 0457 07/30/14 1035  TROPONINI 0.04* 0.06*   Lab Results  Component Value Date   WBC 10.5 07/30/2014   HGB 8.7* 07/30/2014   HCT 30.1* 07/30/2014   MCV 64.3* 07/30/2014   PLT 324 07/30/2014    Recent Labs Lab 07/30/14 0234  NA 133*  K 4.3  CL 99*  CO2 26  BUN 9  CREATININE 0.86  CALCIUM 8.7*  GLUCOSE 115*  Radiology/Studies  Dg Chest Port 1 View  07/30/2014   CLINICAL DATA:  Acute onset of shortness of breath. Initial encounter.  EXAM: PORTABLE CHEST - 1 VIEW  COMPARISON:  Chest radiograph performed 10/21/2010  FINDINGS: The lungs are well-aerated. Vascular congestion is noted. Mild bibasilar opacities could reflect mild interstitial edema or pneumonia. A small left pleural effusion is suspected. No pneumothorax is seen.  The cardiomediastinal silhouette is enlarged. No acute osseous abnormalities are seen.  IMPRESSION: Vascular congestion and cardiomegaly. Mild bibasilar opacities could reflect mild interstitial edema or pneumonia. Suspect small left pleural effusion.   Electronically Signed   By: Garald Balding M.D.   On: 07/30/2014 03:01    ECG  Paroxysmal atrial tachycardia and frequent PAC, right bundle branch block.  ASSESSMENT AND PLAN  1. Flash pulmonary edema/acute on chronic diastolic heart failure in the setting of hypertensive urgency  - Echo 07/30/2014 showed EF 74-14%, grade 2 diastolic dysfunction, no regional wall motion abnormality, mild TR/MR, RVSP 36.  - mildly elevated trop in the  setting of HTN urgency and acute HF likely demand ischemia  - may potentially consider renal artery U/S at some point to r/o renal artery stenosis  - need to monitor renal function on lasix  2. Hypertensive urgency  - better after starting lisinopril 5mg  and metoprolol 25mg  BID. Will increase lisinopril to 10mg  for better BP control  3. Microcytic anemia: further workup per primary  4. H/o LLL lobectomy due to pleural abscess and chronic PNA  5. Paroxysmal atrial tachycardia with frequent PACs: started on metoprolol  6. Chronic RBBB  7. Elevated TSH: per primary service   Signed, Almyra Deforest, PA-C 07/30/2014, 6:51 PM The patient was seen with Mr. Eulas Post, [place].  I agree with his assessment and plan.  The patient herself is quite deaf and is also insisting on going home and sleeping in her own bed tonight.  However her husband is reasonable and understands the need for more evaluation and treatment here in the hospital tonight.  She appears to have been having episodes of flash pulmonary edema related to hypertension and diastolic heart failure.  She is making good improvement on current therapy of Lasix, ACE inhibitor, and beta blocker.  Her microcytic anemia and low serum iron is of concern.  Her husband states that she has not been having any difficulty with melanotic or hematochezia although she has had some chronic problems with constipation.  She will need a GI workup but this could be done as an outpatient through her PCP.

## 2014-07-30 NOTE — Evaluation (Signed)
Clinical/Bedside Swallow Evaluation Patient Details  Name: [name] MRN: [id number] Date of Birth: 09-May-1930  Today's Date: 07/30/2014 Time: SLP Start Time (ACUTE ONLY): 39 SLP Stop Time (ACUTE ONLY): 1433 SLP Time Calculation (min) (ACUTE ONLY): 18 min  Past Medical History:  Past Medical History  Diagnosis Date  . Fractured hip 10/2010    fractured left hip, L THR   . On prednisone therapy 2008-2010    Continuous prednisone therapy   . PNA (pneumonia)     w/lung abscess at 73 months of age  . IBS (irritable bowel syndrome)     IBS-constipation  . Palpitations     paroxysmal atrial tachycardia, EF normal, diastolic dysfunction-echo/Holter 10/10 (she did not wish to take medications prescribed, metoprolol/HCTZ)  . Fibromyalgia     jt pains, Tramadol Miller 0-4 per day    Past Surgical History:  Past Surgical History  Procedure Laterality Date  . Lobectomy  1956    lung, partial   . Cholecystectomy  1998  . Nasal sinus surgery  1982  . Cyst excision      breast cyst removed   . Colonoscopy      2005 wnl per old records   HPI:  81 y.o. female with Past medical history of dementia, anemia; presenting with complains of shortness of breath that started 07/30/14. The patient initially had some shortness of breath on Sunday (07/25/14) and EMS was called she was found to be hypertensive and was requested to go to ER but she refused. Today again she had shortness of breath and called EMS and was found to be significantly hypertensive. Her blood pressure was in 062B systolic. She was significantly hypoxic and tachypneic. She was placed on C Pap and then brought to ED (07/30/14) The history was taken from patient's husband mentions there was no fever no chills no chest pain no nausea no vomiting no choking no diarrhea no constipation. No recent change in her medications. She did not have any issues with her blood pressure. Since last 2 weeks she has been having increasing  shortness of breath on exertion and will get tired easily. She has developed a skin infection which has been treated locally.  Assessment / Plan / Recommendation Clinical Impression   Pt did not exhibit any overt s/s of aspiration with any consistency including thin via cup, puree and solids; oral holding noted and pt distracted intermittently during BSE d/t cognitive status (dementia) and extremely HOH.  Pt denied any dysphagia, but decreased mastication noted; pt usually wears dentures (upper/lower) and did not have these available for BSE.  Hoarse vocal quality noted overall prior to po intake and after intake.  Husband denies any difficulty at home with eating/drinking.  Recommend ST follow for diet tolerance, but continue soft diet/thin liquids with swallow precautions of thin via cup (no straw; pt doesn't usually use one) and slow rate/small bites/sips.    Aspiration Risk  Mild    Diet Recommendation Dysphagia 3 (Mech soft);Thin   Medication Administration: Other (Comment) (as tolerated) Compensations: Slow rate;Small sips/bites    Other  Recommendations Oral Care Recommendations: Oral care BID   Follow Up Recommendations    n/a   Frequency and Duration min 2x/week  1 week   Pertinent Vitals/Pain WDL    SLP Swallow Goals  See POC   Swallow Study Prior Functional Status   Independent at home with most ADL's    General Date of Onset: 07/30/14 Other Pertinent Information: 81 y.o. female with Past  medical history of dementia, anemia. Type of Study: Bedside swallow evaluation Diet Prior to this Study: Dysphagia 3 (soft);Thin liquids Temperature Spikes Noted: No Respiratory Status: Supplemental O2 delivered via (comment) ([place]) History of Recent Intubation: No Behavior/Cognition: Alert;Confused;Distractible Oral Cavity - Dentition: Edentulous;Other (Comment) (Pt's dentures unavailable) Self-Feeding Abilities: Able to feed self Patient Positioning: Other (comment) (side of  bed) Baseline Vocal Quality: Hoarse Volitional Cough: Strong Volitional Swallow:  (Pt eating prior to SLP arrival)    Oral/Motor/Sensory Function Overall Oral Motor/Sensory Function: Other (comment) (Pt refused portion of OME)   Ice Chips Ice chips: Not tested   Thin Liquid Thin Liquid: Within functional limits Presentation: Cup    Nectar Thick Nectar Thick Liquid: Not tested   Honey Thick Honey Thick Liquid: Not tested   Puree Puree: Within functional limits Presentation: Self Fed   Solid       Solid: Impaired Presentation: Self Fed Oral Phase Impairments: Impaired mastication Oral Phase Functional Implications: Oral holding       ADAMS,PAT, M.S., CCC-SLP 07/30/2014,2:41 PM

## 2014-07-30 NOTE — Progress Notes (Signed)
New Admission Note: Pt transferred to the unit via stretcher from the [facility] ED  Arrival Method: Stretcher Mental Orientation:  Alert and oriented to self, place and situation. Disoriented to time. Pt is severely hard of hearing Telemetry: Initiated Assessment: To be Completed Skin: Intact IV: L fa SL Pain: Denies Tubes: O2 via [place] Safety Measures: Safety Fall Prevention Plan has been discussed Admission:  [address] Orientation: Patient has been orientated to the room, unit and staff.  Family: Pt spouse at bedside,  Will continue to monitor the patient. Call light has been placed within reach and bed alarm has been activated.   Mady Gemma, RN-BC Phone: [phone]

## 2014-07-30 NOTE — ED Provider Notes (Signed)
CSN: [id number]     Arrival date & time 07/30/14  0215 History   None    This chart was scribed for Theresa Flemings, MD by Forrestine Him, ED Scribe. This patient was seen in room A03C/A03C and the patient's care was started 2:17 AM.   Chief Complaint  Patient presents with  . Shortness of Breath   The history is provided by the patient. No language interpreter was used.    HPI Comments: [name] brought in by EMS is a 81 y.o. female who presents to the Emergency Department complaining of constant, ongoing, unchanged shortness of breath onset just prior to arrival. EMS was called out 2 days ago for same. However, pt refused to come to ED for evaluation. EMS called this evening as she was hypertensive with worsening shortness of breath. CPAP placed en route to department. No other interventions given. No recent fever, chills, nausea, vomiting, or diarrhea. Lexapro and Hydroxyzine taken daily. Pt with known allergies to Compazine, Demerol, Dilaudid, and Novocain.  Past Medical History  Diagnosis Date  . Fractured hip 10/2010    fractured left hip, L THR   . On prednisone therapy 2008-2010    Continuous prednisone therapy   . PNA (pneumonia)     w/lung abscess at 21 months of age  . IBS (irritable bowel syndrome)     IBS-constipation  . Palpitations     paroxysmal atrial tachycardia, EF normal, diastolic dysfunction-echo/Holter 10/10 (she did not wish to take medications prescribed, metoprolol/HCTZ)  . Fibromyalgia     jt pains, Tramadol Miller 0-4 per day    Past Surgical History  Procedure Laterality Date  . Lobectomy  1956    lung, partial   . Cholecystectomy  1998  . Nasal sinus surgery  1982  . Cyst excision      breast cyst removed   . Colonoscopy      2005 wnl per old records   Family History  Problem Relation Age of Onset  . Heart attack Mother   . Lupus Paternal Grandmother    History  Substance Use Topics  . Smoking status: Never Smoker   . Smokeless tobacco:  Not on file  . Alcohol Use: Not on file   OB History    No data available     Review of Systems  Constitutional: Negative for fever and chills.  Respiratory: Positive for shortness of breath. Negative for cough.   Cardiovascular: Negative for chest pain.  Gastrointestinal: Negative for nausea and vomiting.  All other systems reviewed and are negative.     Allergies  Compazine; Demerol; Dilaudid; Novocain; and Other  Home Medications   Prior to Admission medications   Medication Sig Start Date End Date Taking? Authorizing Provider  Escitalopram Oxalate (LEXAPRO PO) Take by mouth. Lexapro 10 MG Tablet 1 tablet Once a day    Historical Provider, MD  hydrOXYzine (ATARAX/VISTARIL) 25 MG tablet Take 25 mg by mouth as needed. HydrOXYzine HCl 25 Tablet 1/2 to 1 tablet Three times a day Orally 30 days    Historical Provider, MD  traMADol (ULTRAM) 50 MG tablet Take 50 mg by mouth as directed. Tramadol HCl 50MG  Tablet 1-2 tablets as needed for pain three times a day as needed    Historical Provider, MD   Triage Vitals: SpO2 99%   Physical Exam  Constitutional: She is oriented to person, place, and time. She appears well-developed and well-nourished. She appears distressed (patient is anxious with moderate respiratory distress).  HENT:  Head: Normocephalic and atraumatic.  Nose: Nose normal.  Mouth/Throat: Oropharynx is clear and moist.  Eyes: Conjunctivae and EOM are normal. Pupils are equal, round, and reactive to light.  Neck: Normal range of motion. Neck supple. No JVD present. No tracheal deviation present. No thyromegaly present.  Cardiovascular: Normal rate, regular rhythm, normal heart sounds and intact distal pulses.  Exam reveals no gallop and no friction rub.   No murmur heard. Pulmonary/Chest: No stridor. She is in respiratory distress. She has wheezes. She has no rales. She exhibits no tenderness.  Abdominal: Soft. Bowel sounds are normal. She exhibits no distension and no  mass. There is no tenderness. There is no rebound and no guarding.  Musculoskeletal: Normal range of motion. She exhibits no edema or tenderness.  Lymphadenopathy:    She has no cervical adenopathy.  Neurological: She is alert and oriented to person, place, and time. She displays normal reflexes. She exhibits normal muscle tone. Coordination normal.  Skin: Skin is warm and dry. No rash noted. No erythema. No pallor.  Psychiatric:  Patient is anxious, asking repetitive questions  Nursing note and vitals reviewed.   ED Course  Procedures (including critical care time)  DIAGNOSTIC STUDIES: Oxygen Saturation is 99% on CPAP, Normal by my interpretation.    COORDINATION OF CARE: 2:17 AM- Will give breathing treatment and Ativan. Will order CXR, urinalysis, BMP, EKG, and CBC. Discussed treatment plan with pt at bedside and pt agreed to plan.     Labs Review Labs Reviewed  BASIC METABOLIC PANEL - Abnormal; Notable for the following:    Sodium 133 (*)    Chloride 99 (*)    Glucose, Bld 115 (*)    Calcium 8.7 (*)    All other components within normal limits  CBC WITH DIFFERENTIAL/PLATELET - Abnormal; Notable for the following:    Hemoglobin 8.7 (*)    HCT 30.1 (*)    MCV 64.3 (*)    MCH 18.6 (*)    MCHC 28.9 (*)    RDW 19.3 (*)    Eosinophils Relative 7 (*)    Monocytes Absolute 1.1 (*)    All other components within normal limits  URINALYSIS, ROUTINE W REFLEX MICROSCOPIC (NOT AT [facility])  BRAIN NATRIURETIC PEPTIDE  TROPONIN I  TROPONIN I  TROPONIN I  RETICULOCYTES  IRON AND TIBC  TSH  PROTIME-INR  I-STAT TROPOININ, ED    Imaging Review Dg Chest Port 1 View  07/30/2014   CLINICAL DATA:  Acute onset of shortness of breath. Initial encounter.  EXAM: PORTABLE CHEST - 1 VIEW  COMPARISON:  Chest radiograph performed 10/21/2010  FINDINGS: The lungs are well-aerated. Vascular congestion is noted. Mild bibasilar opacities could reflect mild interstitial edema or pneumonia. A small left  pleural effusion is suspected. No pneumothorax is seen.  The cardiomediastinal silhouette is enlarged. No acute osseous abnormalities are seen.  IMPRESSION: Vascular congestion and cardiomegaly. Mild bibasilar opacities could reflect mild interstitial edema or pneumonia. Suspect small left pleural effusion.   Electronically Signed   By: Garald Balding M.D.   On: 07/30/2014 03:01     EKG Interpretation   Date/Time:  Friday July 30 2014 02:26:56 EDT Ventricular Rate:  109 PR Interval:  161 QRS Duration: 130 QT Interval:  348 QTC Calculation: 469 R Axis:   -78 Text Interpretation:  Sinus tachycardia with PACs RBBB and LAFB Since last  tracing rate faster Otherwise no significant change Reconfirmed by Kiari Hosmer   MD, Hannah Strader ([id number]) on 07/30/2014 2:39:24 AM  MDM   Final diagnoses:  SOB (shortness of breath)  Acute pulmonary edema  Hypoxia    81 year old female with respiratory distress.  EMS reports.  Visit 2 days ago noted be hypertensive, tachycardic and hypoxic, refused transfer.  Symptoms worsen tonight.  Patient with dementia and is a poor historian.  Husband reports remote history of bronchitis.  Wheezing heard on exam.  Plan for DuoNeb.  Patient initially presented on see Pap with EMS, able to transition to 4 L here with good saturations.  I personally performed the services described in this documentation, which was scribed in my presence. The recorded information has been reviewed and is accurate.    Theresa Flemings, MD 07/30/14 [id number]

## 2014-07-31 DIAGNOSIS — I10 Essential (primary) hypertension: Secondary | ICD-10-CM

## 2014-07-31 DIAGNOSIS — F0390 Unspecified dementia without behavioral disturbance: Secondary | ICD-10-CM

## 2014-07-31 DIAGNOSIS — I5031 Acute diastolic (congestive) heart failure: Secondary | ICD-10-CM

## 2014-07-31 DIAGNOSIS — D509 Iron deficiency anemia, unspecified: Secondary | ICD-10-CM

## 2014-07-31 LAB — BASIC METABOLIC PANEL
Anion gap: 10 (ref 5–15)
BUN: 22 mg/dL — ABNORMAL HIGH (ref 6–20)
CALCIUM: 8.9 mg/dL (ref 8.9–10.3)
CHLORIDE: 95 mmol/L — AB (ref 101–111)
CO2: 26 mmol/L (ref 22–32)
CREATININE: 1.07 mg/dL — AB (ref 0.44–1.00)
GFR calc Af Amer: 55 mL/min — ABNORMAL LOW (ref >60)
GFR calc non Af Amer: 47 mL/min — ABNORMAL LOW (ref >60)
GLUCOSE: 103 mg/dL — AB (ref 65–99)
POTASSIUM: 4.6 mmol/L (ref 3.5–5.1)
Sodium: 131 mmol/L — ABNORMAL LOW (ref 135–145)

## 2014-07-31 MED ORDER — LISINOPRIL 2.5 MG PO TABS: 2.5000 mg | ORAL_TABLET | Freq: Every day | ORAL | Status: DC

## 2014-07-31 MED ORDER — METOPROLOL TARTRATE 25 MG PO TABS: 12.5000 mg | ORAL_TABLET | Freq: Two times a day (BID) | ORAL | Status: DC

## 2014-07-31 MED ORDER — METOPROLOL TARTRATE 12.5 MG HALF TABLET
12.5000 mg | ORAL_TABLET | Freq: Two times a day (BID) | ORAL | Status: DC
  Filled 2014-07-31 (×2): qty 1

## 2014-07-31 MED ORDER — POLYETHYLENE GLYCOL 3350 17 G PO PACK
17.0000 g | PACK | Freq: Every day | ORAL | Status: DC
  Administered 2014-07-31: 17 g via ORAL
  Filled 2014-07-31: qty 1

## 2014-07-31 MED ORDER — FUROSEMIDE 20 MG PO TABS
20.0000 mg | ORAL_TABLET | Freq: Every day | ORAL | Status: DC
Start: 2014-07-31 — End: 2014-07-31
  Administered 2014-07-31: 20 mg via ORAL
  Filled 2014-07-31: qty 1

## 2014-07-31 MED ORDER — LISINOPRIL 5 MG PO TABS: 5.0000 mg | ORAL_TABLET | Freq: Every day | ORAL | Status: DC

## 2014-07-31 MED ORDER — ASPIRIN 81 MG PO TBEC: 81.0000 mg | DELAYED_RELEASE_TABLET | Freq: Every day | ORAL | Status: DC

## 2014-07-31 MED ORDER — FUROSEMIDE 20 MG PO TABS: 20.0000 mg | ORAL_TABLET | ORAL | Status: DC

## 2014-07-31 NOTE — Progress Notes (Addendum)
Patient ID: [name], female   DOB: [birth year], 81 y.o.   MRN: [id number]    :  Subjective:   No complaints   Objective:   Temp:  [97.7 F (36.5 C)-98.4 F (36.9 C)] 97.7 F (36.5 C) (06/25 0513) Pulse Rate:  [57-69] 60 (06/25 0513) Resp:  [13-16] 14 (06/25 0513) BP: (108-145)/(37-77) 108/37 mmHg (06/25 0513) SpO2:  [98 %-100 %] 98 % (06/25 0513) Weight:  [139 lb 8 oz (63.277 kg)] 139 lb 8 oz (63.277 kg) (06/24 2043)    Filed Weights   07/30/14 0237 07/30/14 0559 07/30/14 2043  Weight: 140 lb (63.504 kg) 139 lb 9.6 oz (63.322 kg) 139 lb 8 oz (63.277 kg)    Intake/Output Summary (Last 24 hours) at 07/31/14 0725 Last data filed at 07/31/14 0600  Gross per 24 hour  Intake    240 ml  Output    200 ml  Net     40 ml    Telemetry:  Exam:  General: NAD  HEENT: sclera clear  Resp: CTAB  Cardiac: RRR, no m/r/g, no JVD  GI: abdomen soft, NT, ND  MSK: no LE edema    Psych  Lab Results:  Basic Metabolic Panel:  Recent Labs Lab 07/30/14 0234  NA 133*  K 4.3  CL 99*  CO2 26  GLUCOSE 115*  BUN 9  CREATININE 0.86  CALCIUM 8.7*    Liver Function Tests:  Recent Labs Lab 07/30/14 1720  AST 23  ALT 12*  ALKPHOS 77  BILITOT 0.6  PROT 6.6  ALBUMIN 3.4*    CBC:  Recent Labs Lab 07/30/14 0234  WBC 10.5  HGB 8.7*  HCT 30.1*  MCV 64.3*  PLT 324    Cardiac Enzymes:  Recent Labs Lab 07/30/14 0457 07/30/14 1035 07/30/14 1720  TROPONINI 0.04* 0.06* 0.05*    BNP: No results for input(s): PROBNP in the last 8760 hours.  Coagulation:  Recent Labs Lab 07/30/14 0457  INR 1.08    ECG:   Medications:   Scheduled Medications: . aspirin EC  81 mg Oral Daily  . enoxaparin (LOVENOX) injection  40 mg Subcutaneous Daily  . escitalopram  10 mg Oral Daily  . furosemide  40 mg Oral Daily  . lisinopril  10 mg Oral Daily  . metoprolol tartrate  25 mg Oral BID  . polyethylene glycol  17 g Oral Daily  . sodium chloride  3 mL  Intravenous Q12H     Infusions:     PRN Medications:  sodium chloride, acetaminophen, hydrOXYzine, ondansetron (ZOFRAN) IV, sodium chloride, traMADol     Assessment/Plan    82 year old Caucasian female with past medical history of fibromyalgia, history of paroxysmal atrial tachycardia with PACs and h/o remote pleural abscess s/p LLL lobectomy present with dyspnea and hypertensive urgency  1. Acute on chronic diastolic heart failure - exacerbation in the setting of severe HTN - Echo 07/30/2014 showed EF 09-47%, grade 2 diastolic dysfunction, no regional wall motion abnormality, mild TR/MR, RVSP 36. - only one shift of I/Os reported thus far, he is on lasix oral 40mg  daily. No repeat labs this morning, will order BMET - appears euvolemic on exam, main component of her pulmonary edema was likely high afterload. Will decrease lasix to 20mg  daily.   2. HTN - currently on lasix 40, lisinopil 10mg , lopressor 25mg  bid - pressures at goal this AM, continue current meds. If continues to trend down consider decreasing her lisionpril to 5mg  daily.   3. PACs -  sinus brady this AM, decrease lopressor to 12.5mg  bid     Carlyle Dolly, M.D.

## 2014-07-31 NOTE — Discharge Summary (Addendum)
Discharge Summary  [name] LTJ:[id number] DOB: 27-Apr-1930  PCP:  Melinda Crutch, MD  Admit date: 07/30/2014 Discharge date: 07/31/2014  Time spent: >24mins  Recommendations for Outpatient Follow-up:  1. F/u with PMD within a week, repeat cbc/bmp/mag. continue monitor blood pressure control. preform FOBT for anemia and follow up on anemia work up result done in the hospital. Monitor thyroid function. 2. F/u with cardiology for diastolic CHF.  Discharge Diagnoses:  Active Hospital Problems   Diagnosis Date Noted  . Acute CHF 07/30/2014  . Accelerated hypertension 07/30/2014  . Anemia 07/30/2014  . Dementia 07/30/2014  . Acute confusional state 07/30/2014    Resolved Hospital Problems   Diagnosis Date Noted Date Resolved  No resolved problems to display.    Discharge Condition: stable  Diet recommendation: heart healthy  Filed Weights   07/30/14 0237 07/30/14 0559 07/30/14 2043  Weight: 63.504 kg (140 lb) 63.322 kg (139 lb 9.6 oz) 63.277 kg (139 lb 8 oz)    History of present illness:  [name] is a 81 y.o. female with Past medical history of dementia, anemia. The patient is presenting with complains of shortness of breath that started early am on 6/24  The patient initially had some shortness of breath on Sunday and EMS was called she was found to be hypertensive and was requested to go to ER but she refused. Today again she had shortness of breath and called EMS and was found to be significantly hypertensive. Her blood pressure was in 076A systolic. She was significantly hypoxic and tachypneic. She was placed on C Pap and then was brought here. The history was taken from patient's husband mentions there was no fever no chills no chest pain no nausea no vomiting no choking no diarrhea no constipation. No recent change in her medications. She did not have any issues with her blood pressure. Since last 2 weeks she has been having increasing shortness of breath on  exertion and will get tired easily. She has developed a skin infection which has been treated locally.  The patient is coming from home. And at her baseline independent for most of her ADL.   Hospital Course:  Principal Problem:   Acute CHF Active Problems:   Accelerated hypertension   Anemia   Dementia   Acute confusional state  1. Acute CHF The patient is presenting with complaints of shortness of breath with hypertension. Most likely she has flash pulmonary edema secondary to acute accelerated hypertension. She has received IV Lasix on admission, then oral lasix and has responded well. telemetry sinus rhythm with frequent pac's which has improved at discharge.  Cardiology consulted, agree with aspirin, Lopressor, lisinopril,lasix. Dose decreased due to patient blood pressure low normal and has some bradycardia, cr mild elevated.   2. Accelerated hypertension. Much improved, bp meds adjusted. Recommend monitor bp at home,  bp meds likely will need to be further adjusted in the next few weeks.    3. Anemia. No active bleeding. Preliminary result showed iron deficiency , will defer pmd to start iron supplement after FOBT. Anemia work up pending, patient insists on going home, will defer pmd to complete anemia work up including FOBT. No indication for blood transfusion.  4. Dementia with acute confusional state. Initially very confused and agitated, Probably secondary to hypoxia. Improved, back to baseline at discharge  Advance goals of care discussion: Full code    Discharge Exam: BP 101/43 mmHg  Pulse 56  Temp(Src) 98.1 F (36.7 C) (Oral)  Resp 16  Ht 5\' 2"  (1.575 m)  Wt 63.277 kg (139 lb 8 oz)  BMI 25.51 kg/m2  SpO2 90%  General: baseline dementia Eyes: PERRL ENT: Oral Mucosa clear moist. Neck: Mild JVD resovled Cardiovascular: S1 and S2 Present, no Murmur, Peripheral Pulses Present Respiratory: Bilateral Air entry equal and Decreased,  Bilateral Crackles has  resolved, no wheezes Abdomen: Bowel Sound present, Soft and non tender Skin: no Rash Extremities: Trace Pedal edema has resolved, no calf tenderness Neurologic: Grossly no focal neuro deficit. Baseline dementia, very hard of hearing    Discharge Instructions You were cared for by a hospitalist during your hospital stay. If you have any questions about your discharge medications or the care you received while you were in the hospital after you are discharged, you can call the unit and asked to speak with the hospitalist on call if the hospitalist that took care of you is not available. Once you are discharged, your primary care physician will handle any further medical issues. Please note that NO REFILLS for any discharge medications will be authorized once you are discharged, as it is imperative that you return to your primary care physician (or establish a relationship with a primary care physician if you do not have one) for your aftercare needs so that they can reassess your need for medications and monitor your lab values.  Discharge Instructions    Diet - low sodium heart healthy    Complete by:  As directed      Increase activity slowly    Complete by:  As directed             Medication List    TAKE these medications        aspirin 81 MG EC tablet  Take 1 tablet (81 mg total) by mouth daily.     clotrimazole-betamethasone cream  Commonly known as:  LOTRISONE  Apply 1 application topically 2 (two) times daily.     econazole nitrate 1 % cream  Apply 1 application topically 2 (two) times daily.     escitalopram 10 MG tablet  Commonly known as:  LEXAPRO  Take 10 mg by mouth daily.     furosemide 20 MG tablet  Commonly known as:  LASIX  Take 1 tablet (20 mg total) by mouth every other day.  Start taking on:  08/02/2014     hydrOXYzine 25 MG tablet  Commonly known as:  ATARAX/VISTARIL  Take 25 mg by mouth 3 (three) times daily as needed for anxiety. HydrOXYzine HCl 25  Tablet 1/2 to 1 tablet Three times a day Orally 30 days     lisinopril 2.5 MG tablet  Commonly known as:  PRINIVIL,ZESTRIL  Take 1 tablet (2.5 mg total) by mouth daily.  Start taking on:  08/01/2014     metoprolol tartrate 25 MG tablet  Commonly known as:  LOPRESSOR  Take 0.5 tablets (12.5 mg total) by mouth 2 (two) times daily.     PRESCRIPTION MEDICATION  Apply 1 application topically daily. Steroid cream for rash on foot.     traMADol 50 MG tablet  Commonly known as:  ULTRAM  Take 50-100 mg by mouth 3 (three) times daily as needed for moderate pain.       Allergies  Allergen Reactions  . Compazine [Prochlorperazine Edisylate]     could not talk or hold head up  . Demerol [Meperidine]     itch, nasal cong  . Dilaudid [Hydromorphone Hcl]  itch  . Novocain [Procaine]     heart races  . Other     "some antibiotic": muscle flexion Tetanus-Diphth-Acell Pertussis: left side numb       Follow-up Information    Follow up with  Melinda Crutch, MD In 1 week.   Specialty:  Family Medicine   Why:  hospital discharge follow up, pmd to repeat cbc/bmp/mag   Contact information:   [place] [address] [phone]       Follow up with Warren Danes, MD In 3 weeks.   Specialty:  Cardiology   Why:  diastolic chf   Contact information:   [employer] [address] [place] [address] [phone]        The results of significant diagnostics from this hospitalization (including imaging, microbiology, ancillary and laboratory) are listed below for reference.    Significant Diagnostic Studies: Dg Chest Port 1 View  07/30/2014   CLINICAL DATA:  Acute onset of shortness of breath. Initial encounter.  EXAM: PORTABLE CHEST - 1 VIEW  COMPARISON:  Chest radiograph performed 10/21/2010  FINDINGS: The lungs are well-aerated. Vascular congestion is noted. Mild bibasilar opacities could reflect mild interstitial edema or pneumonia. A small left pleural effusion  is suspected. No pneumothorax is seen.  The cardiomediastinal silhouette is enlarged. No acute osseous abnormalities are seen.  IMPRESSION: Vascular congestion and cardiomegaly. Mild bibasilar opacities could reflect mild interstitial edema or pneumonia. Suspect small left pleural effusion.   Electronically Signed   By: Garald Balding M.D.   On: 07/30/2014 03:01    Microbiology: No results found for this or any previous visit (from the past 240 hour(s)).   Labs: Basic Metabolic Panel:  Recent Labs Lab 07/30/14 0234 07/31/14 1045  NA 133* 131*  K 4.3 4.6  CL 99* 95*  CO2 26 26  GLUCOSE 115* 103*  BUN 9 22*  CREATININE 0.86 1.07*  CALCIUM 8.7* 8.9   Liver Function Tests:  Recent Labs Lab 07/30/14 1720  AST 23  ALT 12*  ALKPHOS 77  BILITOT 0.6  PROT 6.6  ALBUMIN 3.4*   No results for input(s): LIPASE, AMYLASE in the last 168 hours. No results for input(s): AMMONIA in the last 168 hours. CBC:  Recent Labs Lab 07/30/14 0234  WBC 10.5  NEUTROABS 7.2  HGB 8.7*  HCT 30.1*  MCV 64.3*  PLT 324   Cardiac Enzymes:  Recent Labs Lab 07/30/14 0457 07/30/14 1035 07/30/14 1720  TROPONINI 0.04* 0.06* 0.05*   BNP: BNP (last 3 results)  Recent Labs  07/30/14 0456  BNP 138.7*    ProBNP (last 3 results) No results for input(s): PROBNP in the last 8760 hours.  CBG: No results for input(s): GLUCAP in the last 168 hours.     SignedFlorencia Reasons MD, PhD  [facility] 07/31/2014, 3:24 PM

## 2014-07-31 NOTE — Progress Notes (Signed)
Discharge instructions and medications discussed with patient and spouse.  All questions answered.  

## 2014-07-31 NOTE — Progress Notes (Signed)
Admission history completed with husband d/t patient hard of hearing.

## 2014-08-02 LAB — LEGIONELLA ANTIGEN, URINE

## 2014-08-05 ENCOUNTER — Encounter (HOSPITAL_COMMUNITY): Payer: Self-pay | Admitting: Emergency Medicine

## 2014-08-05 ENCOUNTER — Inpatient Hospital Stay (HOSPITAL_COMMUNITY)
Admission: EM | Admit: 2014-08-05 | Discharge: 2014-08-10 | DRG: 208 | Disposition: A | Discharge status: Home Health | Payer: Medicare Other | Attending: Internal Medicine | Admitting: Internal Medicine

## 2014-08-05 ENCOUNTER — Emergency Department (HOSPITAL_COMMUNITY): Payer: Medicare Other

## 2014-08-05 ENCOUNTER — Inpatient Hospital Stay (HOSPITAL_COMMUNITY): Payer: Medicare Other

## 2014-08-05 DIAGNOSIS — R579 Shock, unspecified: Secondary | ICD-10-CM | POA: Diagnosis present

## 2014-08-05 DIAGNOSIS — J9601 Acute respiratory failure with hypoxia: Secondary | ICD-10-CM | POA: Diagnosis not present

## 2014-08-05 DIAGNOSIS — J9690 Respiratory failure, unspecified, unspecified whether with hypoxia or hypercapnia: Secondary | ICD-10-CM | POA: Insufficient documentation

## 2014-08-05 DIAGNOSIS — J9600 Acute respiratory failure, unspecified whether with hypoxia or hypercapnia: Secondary | ICD-10-CM | POA: Diagnosis not present

## 2014-08-05 DIAGNOSIS — K589 Irritable bowel syndrome without diarrhea: Secondary | ICD-10-CM | POA: Diagnosis present

## 2014-08-05 DIAGNOSIS — I10 Essential (primary) hypertension: Secondary | ICD-10-CM | POA: Diagnosis present

## 2014-08-05 DIAGNOSIS — R6510 Systemic inflammatory response syndrome (SIRS) of non-infectious origin without acute organ dysfunction: Secondary | ICD-10-CM | POA: Diagnosis present

## 2014-08-05 DIAGNOSIS — N179 Acute kidney failure, unspecified: Secondary | ICD-10-CM | POA: Diagnosis not present

## 2014-08-05 DIAGNOSIS — Z452 Encounter for adjustment and management of vascular access device: Secondary | ICD-10-CM | POA: Diagnosis not present

## 2014-08-05 DIAGNOSIS — R069 Unspecified abnormalities of breathing: Secondary | ICD-10-CM | POA: Diagnosis not present

## 2014-08-05 DIAGNOSIS — I5033 Acute on chronic diastolic (congestive) heart failure: Secondary | ICD-10-CM | POA: Diagnosis present

## 2014-08-05 DIAGNOSIS — F0391 Unspecified dementia with behavioral disturbance: Secondary | ICD-10-CM | POA: Diagnosis not present

## 2014-08-05 DIAGNOSIS — G9341 Metabolic encephalopathy: Secondary | ICD-10-CM | POA: Diagnosis present

## 2014-08-05 DIAGNOSIS — D509 Iron deficiency anemia, unspecified: Secondary | ICD-10-CM | POA: Diagnosis present

## 2014-08-05 DIAGNOSIS — M797 Fibromyalgia: Secondary | ICD-10-CM | POA: Diagnosis present

## 2014-08-05 DIAGNOSIS — Z6828 Body mass index (BMI) 28.0-28.9, adult: Secondary | ICD-10-CM

## 2014-08-05 DIAGNOSIS — I5031 Acute diastolic (congestive) heart failure: Secondary | ICD-10-CM | POA: Diagnosis not present

## 2014-08-05 DIAGNOSIS — R739 Hyperglycemia, unspecified: Secondary | ICD-10-CM | POA: Diagnosis present

## 2014-08-05 DIAGNOSIS — F0390 Unspecified dementia without behavioral disturbance: Secondary | ICD-10-CM | POA: Diagnosis present

## 2014-08-05 DIAGNOSIS — R1310 Dysphagia, unspecified: Secondary | ICD-10-CM | POA: Diagnosis present

## 2014-08-05 HISTORY — DX: Unspecified dementia, unspecified severity, without behavioral disturbance, psychotic disturbance, mood disturbance, and anxiety: F03.90

## 2014-08-05 LAB — CBC
HCT: 26.4 % — ABNORMAL LOW (ref 36.0–46.0)
HEMATOCRIT: 26.7 % — AB (ref 36.0–46.0)
HEMOGLOBIN: 7.8 g/dL — AB (ref 12.0–15.0)
Hemoglobin: 8 g/dL — ABNORMAL LOW (ref 12.0–15.0)
MCH: 19.1 pg — ABNORMAL LOW (ref 26.0–34.0)
MCH: 18.5 pg — ABNORMAL LOW (ref 26.0–34.0)
MCHC: 30 g/dL (ref 30.0–36.0)
MCHC: 29.5 g/dL — AB (ref 30.0–36.0)
MCV: 63.7 fL — AB (ref 78.0–100.0)
MCV: 62.7 fL — ABNORMAL LOW (ref 78.0–100.0)
PLATELETS: 324 10*3/uL (ref 150–400)
Platelets: 340 10*3/uL (ref 150–400)
RBC: 4.19 MIL/uL (ref 3.87–5.11)
RBC: 4.21 MIL/uL (ref 3.87–5.11)
RDW: 19 % — ABNORMAL HIGH (ref 11.5–15.5)
RDW: 18.7 % — ABNORMAL HIGH (ref 11.5–15.5)
WBC: 12.6 10*3/uL — ABNORMAL HIGH (ref 4.0–10.5)
WBC: 10.8 10*3/uL — ABNORMAL HIGH (ref 4.0–10.5)

## 2014-08-05 LAB — COMPREHENSIVE METABOLIC PANEL
ALBUMIN: 3.5 g/dL (ref 3.5–5.0)
ALK PHOS: 93 U/L (ref 38–126)
ALT: 21 U/L (ref 14–54)
ALT: 20 U/L (ref 14–54)
ANION GAP: 8 (ref 5–15)
AST: 45 U/L — AB (ref 15–41)
AST: 47 U/L — ABNORMAL HIGH (ref 15–41)
Albumin: 3.5 g/dL (ref 3.5–5.0)
Alkaline Phosphatase: 88 U/L (ref 38–126)
Anion gap: 10 (ref 5–15)
BUN: 18 mg/dL (ref 6–20)
BUN: 16 mg/dL (ref 6–20)
CALCIUM: 8.3 mg/dL — AB (ref 8.9–10.3)
CO2: 24 mmol/L (ref 22–32)
CO2: 29 mmol/L (ref 22–32)
Calcium: 8.6 mg/dL — ABNORMAL LOW (ref 8.9–10.3)
Chloride: 98 mmol/L — ABNORMAL LOW (ref 101–111)
Chloride: 94 mmol/L — ABNORMAL LOW (ref 101–111)
Creatinine, Ser: 1.16 mg/dL — ABNORMAL HIGH (ref 0.44–1.00)
Creatinine, Ser: 1.05 mg/dL — ABNORMAL HIGH (ref 0.44–1.00)
GFR calc Af Amer: 50 mL/min — ABNORMAL LOW (ref >60)
GFR calc Af Amer: 56 mL/min — ABNORMAL LOW (ref >60)
GFR calc non Af Amer: 43 mL/min — ABNORMAL LOW (ref >60)
GFR, EST NON AFRICAN AMERICAN: 48 mL/min — AB (ref >60)
GLUCOSE: 227 mg/dL — AB (ref 65–99)
Glucose, Bld: 119 mg/dL — ABNORMAL HIGH (ref 65–99)
Potassium: 4.9 mmol/L (ref 3.5–5.1)
Potassium: 4.5 mmol/L (ref 3.5–5.1)
Sodium: 130 mmol/L — ABNORMAL LOW (ref 135–145)
Sodium: 133 mmol/L — ABNORMAL LOW (ref 135–145)
Total Bilirubin: 0.4 mg/dL (ref 0.3–1.2)
Total Bilirubin: 0.6 mg/dL (ref 0.3–1.2)
Total Protein: 7.2 g/dL (ref 6.5–8.1)
Total Protein: 7.3 g/dL (ref 6.5–8.1)

## 2014-08-05 LAB — CBC WITH DIFFERENTIAL/PLATELET
BASOS PCT: 1 % (ref 0–1)
Basophils Absolute: 0.1 10*3/uL (ref 0.0–0.1)
EOS ABS: 0.4 10*3/uL (ref 0.0–0.7)
Eosinophils Relative: 3 % (ref 0–5)
HCT: 31.1 % — ABNORMAL LOW (ref 36.0–46.0)
Hemoglobin: 9.1 g/dL — ABNORMAL LOW (ref 12.0–15.0)
LYMPHS PCT: 10 % — AB (ref 12–46)
Lymphs Abs: 1.4 10*3/uL (ref 0.7–4.0)
MCH: 18.5 pg — AB (ref 26.0–34.0)
MCHC: 28.6 g/dL — ABNORMAL LOW (ref 30.0–36.0)
MCV: 64.5 fL — AB (ref 78.0–100.0)
MONOS PCT: 9 % (ref 3–12)
Monocytes Absolute: 1.3 10*3/uL — ABNORMAL HIGH (ref 0.1–1.0)
NEUTROS ABS: 11.2 10*3/uL — AB (ref 1.7–7.7)
NEUTROS PCT: 77 % (ref 43–77)
Platelets: 391 10*3/uL (ref 150–400)
RBC: 4.82 MIL/uL (ref 3.87–5.11)
RDW: 19 % — ABNORMAL HIGH (ref 11.5–15.5)
WBC: 14.4 10*3/uL — AB (ref 4.0–10.5)

## 2014-08-05 LAB — I-STAT ARTERIAL BLOOD GAS, ED
Acid-Base Excess: 2 mmol/L (ref 0.0–2.0)
BICARBONATE: 27.4 meq/L — AB (ref 20.0–24.0)
O2 SAT: 97 %
PCO2 ART: 45 mmHg (ref 35.0–45.0)
PO2 ART: 87 mmHg (ref 80.0–100.0)
Patient temperature: 97
TCO2: 29 mmol/L (ref 0–100)
pH, Arterial: 7.388 (ref 7.350–7.450)

## 2014-08-05 LAB — I-STAT CG4 LACTIC ACID, ED: LACTIC ACID, VENOUS: 2.25 mmol/L — AB (ref 0.5–2.0)

## 2014-08-05 LAB — I-STAT TROPONIN, ED: TROPONIN I, POC: 0.01 ng/mL (ref 0.00–0.08)

## 2014-08-05 LAB — I-STAT CHEM 8, ED
BUN: 22 mg/dL — AB (ref 6–20)
CALCIUM ION: 1.14 mmol/L (ref 1.13–1.30)
CREATININE: 1.1 mg/dL — AB (ref 0.44–1.00)
Chloride: 96 mmol/L — ABNORMAL LOW (ref 101–111)
Glucose, Bld: 217 mg/dL — ABNORMAL HIGH (ref 65–99)
HEMATOCRIT: 34 % — AB (ref 36.0–46.0)
Hemoglobin: 11.6 g/dL — ABNORMAL LOW (ref 12.0–15.0)
POTASSIUM: 4.6 mmol/L (ref 3.5–5.1)
SODIUM: 133 mmol/L — AB (ref 135–145)
TCO2: 25 mmol/L (ref 0–100)

## 2014-08-05 LAB — PROTIME-INR
INR: 1.14 (ref 0.00–1.49)
PROTHROMBIN TIME: 14.8 s (ref 11.6–15.2)

## 2014-08-05 LAB — GLUCOSE, CAPILLARY
GLUCOSE-CAPILLARY: 87 mg/dL (ref 65–99)
Glucose-Capillary: 128 mg/dL — ABNORMAL HIGH (ref 65–99)
Glucose-Capillary: 104 mg/dL — ABNORMAL HIGH (ref 65–99)
Glucose-Capillary: 126 mg/dL — ABNORMAL HIGH (ref 65–99)

## 2014-08-05 LAB — URINALYSIS, ROUTINE W REFLEX MICROSCOPIC
BILIRUBIN URINE: NEGATIVE
Glucose, UA: NEGATIVE mg/dL
Hgb urine dipstick: NEGATIVE
KETONES UR: NEGATIVE mg/dL
Leukocytes, UA: NEGATIVE
Nitrite: NEGATIVE
Protein, ur: NEGATIVE mg/dL
Specific Gravity, Urine: 1.006 (ref 1.005–1.030)
UROBILINOGEN UA: 0.2 mg/dL (ref 0.0–1.0)
pH: 6 (ref 5.0–8.0)

## 2014-08-05 LAB — SODIUM, URINE, RANDOM: SODIUM UR: 117 mmol/L

## 2014-08-05 LAB — LACTIC ACID, PLASMA: Lactic Acid, Venous: 1.2 mmol/L (ref 0.5–2.0)

## 2014-08-05 LAB — MAGNESIUM: MAGNESIUM: 2.2 mg/dL (ref 1.7–2.4)

## 2014-08-05 LAB — PHOSPHORUS: PHOSPHORUS: 5.7 mg/dL — AB (ref 2.5–4.6)

## 2014-08-05 LAB — CORTISOL: Cortisol, Plasma: 24.5 ug/dL

## 2014-08-05 LAB — MRSA PCR SCREENING: MRSA by PCR: NEGATIVE

## 2014-08-05 LAB — TROPONIN I
TROPONIN I: 0.07 ng/mL — AB (ref <0.031)
TROPONIN I: 0.08 ng/mL — AB (ref <0.031)
TROPONIN I: 0.07 ng/mL — AB (ref <0.031)

## 2014-08-05 LAB — CREATININE, URINE, RANDOM: Creatinine, Urine: <10 mg/dL

## 2014-08-05 LAB — PROCALCITONIN: Procalcitonin: <0.1 ng/mL

## 2014-08-05 LAB — BRAIN NATRIURETIC PEPTIDE: B NATRIURETIC PEPTIDE 5: 387.2 pg/mL — AB (ref 0.0–100.0)

## 2014-08-05 MED ORDER — INSULIN ASPART 100 UNIT/ML ~~LOC~~ SOLN
1.0000 [IU] | SUBCUTANEOUS | Status: DC
  Administered 2014-08-05 – 2014-08-07 (×5): 1 [IU] via SUBCUTANEOUS
  Administered 2014-08-09: 3 [IU] via SUBCUTANEOUS
  Administered 2014-08-10 (×2): 1 [IU] via SUBCUTANEOUS

## 2014-08-05 MED ORDER — CETYLPYRIDINIUM CHLORIDE 0.05 % MT LIQD
7.0000 mL | Freq: Four times a day (QID) | OROMUCOSAL | Status: DC
  Administered 2014-08-05 – 2014-08-09 (×14): 7 mL via OROMUCOSAL

## 2014-08-05 MED ORDER — PIPERACILLIN-TAZOBACTAM 3.375 G IVPB 30 MIN
3.3750 g | Freq: Once | INTRAVENOUS | Status: AC
  Administered 2014-08-05: 3.375 g via INTRAVENOUS
  Filled 2014-08-05: qty 50

## 2014-08-05 MED ORDER — PANTOPRAZOLE SODIUM 40 MG IV SOLR
40.0000 mg | Freq: Every day | INTRAVENOUS | Status: DC
  Administered 2014-08-05 – 2014-08-08 (×4): 40 mg via INTRAVENOUS
  Filled 2014-08-05 (×6): qty 40

## 2014-08-05 MED ORDER — PIPERACILLIN-TAZOBACTAM 3.375 G IVPB
3.3750 g | Freq: Three times a day (TID) | INTRAVENOUS | Status: DC
  Administered 2014-08-05 – 2014-08-07 (×6): 3.375 g via INTRAVENOUS
  Filled 2014-08-05 (×8): qty 50

## 2014-08-05 MED ORDER — SODIUM CHLORIDE 0.9 % IV BOLUS (SEPSIS): 500.0000 mL | Freq: Once | INTRAVENOUS | Status: DC

## 2014-08-05 MED ORDER — ROCURONIUM BROMIDE 50 MG/5ML IV SOLN
0.1000 mg/kg | Freq: Once | INTRAVENOUS | Status: DC
  Filled 2014-08-05: qty 0.66

## 2014-08-05 MED ORDER — MIDAZOLAM HCL 2 MG/2ML IJ SOLN
INTRAMUSCULAR | Status: AC
  Administered 2014-08-05: 2 mg via INTRAVENOUS
  Filled 2014-08-05: qty 2

## 2014-08-05 MED ORDER — FUROSEMIDE 10 MG/ML IJ SOLN
80.0000 mg | Freq: Once | INTRAMUSCULAR | Status: AC
  Administered 2014-08-05: 80 mg via INTRAVENOUS
  Filled 2014-08-05: qty 8

## 2014-08-05 MED ORDER — MIDAZOLAM HCL 2 MG/2ML IJ SOLN
INTRAMUSCULAR | Status: AC
  Filled 2014-08-05: qty 2

## 2014-08-05 MED ORDER — DEXTROSE 5 % IV SOLN
1.0000 g | Freq: Two times a day (BID) | INTRAVENOUS | Status: DC
  Filled 2014-08-05 (×2): qty 1

## 2014-08-05 MED ORDER — FENTANYL CITRATE (PF) 100 MCG/2ML IJ SOLN
50.0000 ug | INTRAMUSCULAR | Status: DC | PRN
  Administered 2014-08-05 – 2014-08-06 (×3): 50 ug via INTRAVENOUS
  Filled 2014-08-05 (×2): qty 2

## 2014-08-05 MED ORDER — NITROGLYCERIN IN D5W 200-5 MCG/ML-% IV SOLN
10.0000 ug/min | INTRAVENOUS | Status: DC
  Administered 2014-08-05: 10 ug/min via INTRAVENOUS
  Filled 2014-08-05: qty 250

## 2014-08-05 MED ORDER — VANCOMYCIN HCL IN DEXTROSE 1-5 GM/200ML-% IV SOLN: 1000.0000 mg | INTRAVENOUS | Status: DC

## 2014-08-05 MED ORDER — CHLORHEXIDINE GLUCONATE 0.12 % MT SOLN
15.0000 mL | Freq: Two times a day (BID) | OROMUCOSAL | Status: DC
  Administered 2014-08-05 – 2014-08-09 (×8): 15 mL via OROMUCOSAL
  Filled 2014-08-05 (×9): qty 15

## 2014-08-05 MED ORDER — DEXMEDETOMIDINE HCL IN NACL 200 MCG/50ML IV SOLN
0.4000 ug/kg/h | INTRAVENOUS | Status: DC
  Administered 2014-08-05: 0.4 ug/kg/h via INTRAVENOUS
  Filled 2014-08-05: qty 50

## 2014-08-05 MED ORDER — SODIUM CHLORIDE 0.9 % IV SOLN
25.0000 ug/h | INTRAVENOUS | Status: DC
  Administered 2014-08-05: 50 ug/h via INTRAVENOUS
  Administered 2014-08-06: 150 ug/h via INTRAVENOUS
  Filled 2014-08-05 (×2): qty 50

## 2014-08-05 MED ORDER — ROCURONIUM BROMIDE 50 MG/5ML IV SOLN
INTRAVENOUS | Status: AC
  Filled 2014-08-05: qty 2

## 2014-08-05 MED ORDER — FENTANYL CITRATE (PF) 100 MCG/2ML IJ SOLN
INTRAMUSCULAR | Status: AC
  Administered 2014-08-05: 50 ug via INTRAVENOUS
  Filled 2014-08-05: qty 2

## 2014-08-05 MED ORDER — LIDOCAINE HCL (CARDIAC) 20 MG/ML IV SOLN
INTRAVENOUS | Status: AC
  Filled 2014-08-05: qty 5

## 2014-08-05 MED ORDER — ETOMIDATE 2 MG/ML IV SOLN
INTRAVENOUS | Status: AC
  Administered 2014-08-05: 20 mg
  Filled 2014-08-05: qty 20

## 2014-08-05 MED ORDER — PROPOFOL 1000 MG/100ML IV EMUL
5.0000 ug/kg/min | Freq: Once | INTRAVENOUS | Status: AC
  Administered 2014-08-05: 20 ug/kg/min via INTRAVENOUS
  Filled 2014-08-05: qty 100

## 2014-08-05 MED ORDER — MIDAZOLAM HCL 2 MG/2ML IJ SOLN
2.0000 mg | Freq: Once | INTRAMUSCULAR | Status: AC
  Administered 2014-08-05: 2 mg via INTRAVENOUS
  Filled 2014-08-05: qty 2

## 2014-08-05 MED ORDER — FENTANYL BOLUS VIA INFUSION
25.0000 ug | INTRAVENOUS | Status: DC | PRN
  Filled 2014-08-05: qty 25

## 2014-08-05 MED ORDER — FENTANYL CITRATE (PF) 100 MCG/2ML IJ SOLN
50.0000 ug | Freq: Once | INTRAMUSCULAR | Status: AC
Start: 2014-08-05 — End: 2014-08-05
  Administered 2014-08-05: 50 ug via INTRAVENOUS

## 2014-08-05 MED ORDER — SODIUM CHLORIDE 0.9 % IV BOLUS (SEPSIS)
500.0000 mL | INTRAVENOUS | Status: AC | PRN
  Administered 2014-08-05 (×2): 500 mL via INTRAVENOUS
  Filled 2014-08-05 (×2): qty 500

## 2014-08-05 MED ORDER — VANCOMYCIN HCL IN DEXTROSE 1-5 GM/200ML-% IV SOLN
1000.0000 mg | INTRAVENOUS | Status: DC
  Administered 2014-08-05 – 2014-08-07 (×3): 1000 mg via INTRAVENOUS
  Filled 2014-08-05 (×4): qty 200

## 2014-08-05 MED ORDER — DEXTROSE 5 % IV SOLN
0.0000 ug/min | INTRAVENOUS | Status: DC
  Administered 2014-08-06: 5 ug/min via INTRAVENOUS
  Administered 2014-08-06: 10 ug/min via INTRAVENOUS
  Administered 2014-08-06: 5 ug/min via INTRAVENOUS
  Filled 2014-08-05 (×4): qty 4

## 2014-08-05 MED ORDER — MIDAZOLAM HCL 2 MG/2ML IJ SOLN
1.0000 mg | INTRAMUSCULAR | Status: DC | PRN
  Administered 2014-08-05 – 2014-08-07 (×8): 2 mg via INTRAVENOUS
  Filled 2014-08-05 (×9): qty 2

## 2014-08-05 MED ORDER — SODIUM CHLORIDE 0.9 % IV SOLN: 250.0000 mL | INTRAVENOUS | Status: DC | PRN

## 2014-08-05 MED ORDER — VANCOMYCIN HCL IN DEXTROSE 1-5 GM/200ML-% IV SOLN
1000.0000 mg | Freq: Once | INTRAVENOUS | Status: AC
  Administered 2014-08-05: 1000 mg via INTRAVENOUS

## 2014-08-05 MED ORDER — MIDAZOLAM HCL 2 MG/2ML IJ SOLN
2.0000 mg | Freq: Once | INTRAMUSCULAR | Status: AC
Start: 2014-08-05 — End: 2014-08-05
  Administered 2014-08-05 (×2): 2 mg via INTRAVENOUS

## 2014-08-05 MED ORDER — SUCCINYLCHOLINE CHLORIDE 20 MG/ML IJ SOLN
INTRAMUSCULAR | Status: AC
  Administered 2014-08-05: 100 mg
  Filled 2014-08-05: qty 1

## 2014-08-05 MED ORDER — ROCURONIUM BROMIDE 50 MG/5ML IV SOLN
50.0000 mg | Freq: Once | INTRAVENOUS | Status: DC
  Filled 2014-08-05: qty 5

## 2014-08-05 MED ORDER — HEPARIN SODIUM (PORCINE) 5000 UNIT/ML IJ SOLN
5000.0000 [IU] | Freq: Three times a day (TID) | INTRAMUSCULAR | Status: DC
  Administered 2014-08-05 – 2014-08-10 (×16): 5000 [IU] via SUBCUTANEOUS
  Filled 2014-08-05 (×22): qty 1

## 2014-08-05 MED ORDER — FENTANYL CITRATE (PF) 100 MCG/2ML IJ SOLN
50.0000 ug | INTRAMUSCULAR | Status: DC | PRN
  Administered 2014-08-05 (×2): 50 ug via INTRAVENOUS
  Filled 2014-08-05 (×3): qty 2

## 2014-08-05 MED ORDER — SODIUM CHLORIDE 0.9 % IV SOLN
INTRAVENOUS | Status: DC
  Administered 2014-08-05: 07:00:00 via INTRAVENOUS

## 2014-08-05 NOTE — ED Notes (Signed)
Family at bedside. 

## 2014-08-05 NOTE — Progress Notes (Signed)
[facility] Physician Progress Note and Electrolyte Replacement  Patient Name: LEZLEE GILLS DOB: April 17, 1930 MRN: [id number]   [facility] Physician-Brief Progress Note Patient Name: AUSTINE KELSAY DOB: 24-Nov-1930 MRN: [id number]  Date of Service  08/05/2014   HPI/Events of Note   1. RN repquesting prn versed pushes  2. RN worried abut hgb drop. Per eRN no active bleed   ICD-9-CM ICD-10-CM   1. Acute respiratory failure with hypoxia 518.81 J96.01   2. Respiratory failure 518.81 J96.90 DG Chest [facility]     DG Chest [place] 1 View     DG Chest [place] 1 View     DG Chest Port 1 View    Recent Labs Lab 07/30/14 0234 08/05/14 0330 08/05/14 0404  HGB 8.7* 11.6* 9.1*  HCT 30.1* 34.0* 31.1*  WBC 10.5  --  14.4*  PLT 324  --  391    Recent Labs Lab 07/30/14 0457 08/05/14 0316  INR 1.08 1.14     Recent Labs Lab 07/30/14 0234 07/31/14 1045 08/05/14 0324 08/05/14 0330 08/05/14 0519  CREATININE 0.86 1.07* 1.16* 1.10* 1.05*       eICU Interventions  1, plan prn versed  2. Repeat cbc      Abrina Petz 08/05/2014, 4:14 PM

## 2014-08-05 NOTE — ED Notes (Signed)
Portable xray called

## 2014-08-05 NOTE — ED Notes (Signed)
Dr. Yelverton at the bedside.  

## 2014-08-05 NOTE — Progress Notes (Addendum)
[facility] Physician-Brief Progress Note Patient Name: [name] DOB: 03/18/1930 MRN: [id number]  Date of Service  08/05/2014   HPI/Events of Note   Recent Labs Lab 07/30/14 0234 08/05/14 0330 08/05/14 0404 08/05/14 1718  HGB 8.7* 11.6* 9.1* 8.0*      eICU Interventions  Cbc repeat downtrnd but profile not c/w bleed Monitor Repeat cbc in 4h      Klint Lezcano 08/05/2014, 6:24 PM

## 2014-08-05 NOTE — ED Notes (Signed)
Ems vitals 228/135, o2 sat 70%, p 110. Right branch bundle block.

## 2014-08-05 NOTE — Progress Notes (Signed)
MD advised of limited iv access. IV team consulted and unable to start second PIV site.

## 2014-08-05 NOTE — ED Provider Notes (Signed)
CSN: [id number]     Arrival date & time 08/05/14  0300 History  This chart was scribed for Theresa Miyamoto, MD by Sima Matas, ED Scribe. This patient was seen in room [facility] and the patient's care was started at 3:00 AM.    Chief Complaint  Patient presents with  . Respiratory Distress   LEVEL 5 CAVEAT: RESPIRATORY DISTRESS  The history is provided by the EMS personnel. The history is limited by the condition of the patient. No language interpreter was used.    HPI Comments: [name] is a 81 y.o. female, brought in on ambulance, who presents to the Emergency Department complaining of SOB onset PTA. Patient was brought in on CPAP but was transitioned off. Per EMS, patient was found sitting on edge of bed in a lethargic state and initially resisted coming to the hospital. Patient has associated diaphoresis. EMS reports patient was hypertensive with a BP of 234/103 and a pulse of 108. Patient was admitted to the ED last week with similar symptoms.   Past Medical History  Diagnosis Date  . Fractured hip 10/2010    fractured left hip, L THR   . On prednisone therapy 2008-2010    Continuous prednisone therapy   . 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 IBS (irritable bowel syndrome)     IBS-constipation  . Palpitations     paroxysmal atrial tachycardia, EF normal, diastolic dysfunction-echo/Holter 10/10 (she did not wish to take medications prescribed, metoprolol/HCTZ)  . Fibromyalgia     jt pains, Tramadol Miller 0-4 per day    Past Surgical History  Procedure Laterality Date  . Lobectomy  1956    lung, partial   . Cholecystectomy  1998  . Nasal sinus surgery  1982  . Cyst excision      breast cyst removed   . Colonoscopy      2005 wnl per old records   Family History  Problem Relation Age of Onset  . Heart attack Mother   . Lupus Paternal Grandmother    History  Substance Use Topics  . Smoking status: Never Smoker   . Smokeless tobacco:  Never Used  . Alcohol Use: No   OB History    No data available     Review of Systems  Unable to perform ROS: Acuity of condition      Allergies  Compazine; Demerol; Dilaudid; Novocain; and Other  Home Medications   Prior to Admission medications   Medication Sig Start Date End Date Taking? Authorizing Provider  aspirin EC 81 MG EC tablet Take 1 tablet (81 mg total) by mouth daily. 07/31/14  Yes Florencia Reasons, MD  clotrimazole-betamethasone (LOTRISONE) cream Apply 1 application topically 2 (two) times daily.   Yes Historical Provider, MD  econazole nitrate 1 % cream Apply 1 application topically 2 (two) times daily.   Yes Historical Provider, MD  escitalopram (LEXAPRO) 10 MG tablet Take 10 mg by mouth daily.   Yes Historical Provider, MD  furosemide (LASIX) 20 MG tablet Take 1 tablet (20 mg total) by mouth every other day. 08/02/14  Yes Florencia Reasons, MD  hydrOXYzine (ATARAX/VISTARIL) 25 MG tablet Take 25 mg by mouth 3 (three) times daily as needed for anxiety. HydrOXYzine HCl 25 Tablet 1/2 to 1 tablet Three times a day Orally 30 days   Yes Historical Provider, MD  lisinopril (PRINIVIL,ZESTRIL) 2.5 MG tablet Take 1 tablet (2.5 mg total) by mouth daily. 08/01/14  Yes Florencia Reasons, MD  metoprolol tartrate (LOPRESSOR) 25 MG tablet Take 0.5 tablets (12.5 mg total) by mouth 2 (two) times daily. 07/31/14  Yes Florencia Reasons, MD  PRESCRIPTION MEDICATION Apply 1 application topically daily. Steroid cream for rash on foot.   Yes Historical Provider, MD  traMADol (ULTRAM) 50 MG tablet Take 50-100 mg by mouth 3 (three) times daily as needed for moderate pain.    Yes Historical Provider, MD   BP 90/35 mmHg  Pulse 53  Temp(Src) 97.8 F (36.6 C) (Oral)  Resp 16  SpO2 100% Physical Exam  Constitutional: She appears well-developed and well-nourished. She appears distressed.  Pale, diaphoretic, lethargic  HENT:  Head: Normocephalic and atraumatic.  Mouth/Throat: Oropharynx is clear and moist. No oropharyngeal exudate.   Eyes: EOM are normal. Pupils are equal, round, and reactive to light.  Neck: Normal range of motion. Neck supple. JVD present.  Cardiovascular: Normal rate and regular rhythm.   Pulmonary/Chest: No respiratory distress. She has no wheezes. She has no rales.  Using accessory muscles. Rhonchi in bilateral bases  Abdominal: Soft. Bowel sounds are normal. She exhibits no distension and no mass. There is no tenderness. There is no rebound and no guarding.  Musculoskeletal: Normal range of motion. She exhibits no edema or tenderness.  No calf swelling or tenderness.  Neurological:  Lethargic. Appears fatigued. Not following commands  Skin: No rash noted. She is diaphoretic. No erythema. There is pallor.  Cool to touch  Nursing note and vitals reviewed.   ED Course  INTUBATION Date/Time: 08/05/2014 3:30 AM Performed by: Julianne Rice Authorized by: Lita Mains, Saleha Kalp Consent: The procedure was performed in an emergent situation. Indications: respiratory distress,  respiratory failure and  hypoxemia Intubation method: direct Sedatives: etomidate Paralytic: succinylcholine Laryngoscope size: Mac 3 Tube size: 7.0 mm Tube type: cuffed Number of attempts: 1 Cricoid pressure: yes Cords visualized: yes Post-procedure assessment: chest rise and CO2 detector Breath sounds: equal Cuff inflated: yes ETT to lip: 22 cm Tube secured with: ETT holder Chest x-ray interpreted by me and radiologist. Chest x-ray findings: endotracheal tube in appropriate position Patient tolerance: Patient tolerated the procedure well with no immediate complications     COORDINATION OF CARE: 3:16 AM- Discussed plan for diagnostic imaging and blood work.   Labs Review Labs Reviewed  BRAIN NATRIURETIC PEPTIDE - Abnormal; Notable for the following:    B Natriuretic Peptide 387.2 (*)    All other components within normal limits  COMPREHENSIVE METABOLIC PANEL - Abnormal; Notable for the following:    Sodium 130  (*)    Chloride 98 (*)    Glucose, Bld 227 (*)    Creatinine, Ser 1.16 (*)    Calcium 8.3 (*)    AST 45 (*)    GFR calc non Af Amer 43 (*)    GFR calc Af Amer 50 (*)    All other components within normal limits  CBC WITH DIFFERENTIAL/PLATELET - Abnormal; Notable for the following:    WBC 14.4 (*)    Hemoglobin 9.1 (*)    HCT 31.1 (*)    MCV 64.5 (*)    MCH 18.5 (*)    MCHC 28.6 (*)    RDW 19.0 (*)    Lymphocytes Relative 10 (*)    Neutro Abs 11.2 (*)    Monocytes Absolute 1.3 (*)    All other components within normal limits  GLUCOSE, CAPILLARY - Abnormal; Notable for the following:    Glucose-Capillary 128 (*)    All other  components within normal limits  I-STAT CHEM 8, ED - Abnormal; Notable for the following:    Sodium 133 (*)    Chloride 96 (*)    BUN 22 (*)    Creatinine, Ser 1.10 (*)    Glucose, Bld 217 (*)    Hemoglobin 11.6 (*)    HCT 34.0 (*)    All other components within normal limits  I-STAT CG4 LACTIC ACID, ED - Abnormal; Notable for the following:    Lactic Acid, Venous 2.25 (*)    All other components within normal limits  I-STAT ARTERIAL BLOOD GAS, ED - Abnormal; Notable for the following:    Bicarbonate 27.4 (*)    All other components within normal limits  CULTURE, BLOOD (ROUTINE X 2)  CULTURE, BLOOD (ROUTINE X 2)  URINE CULTURE  CULTURE, RESPIRATORY (NON-EXPECTORATED)  MRSA PCR SCREENING  PROTIME-INR  URINALYSIS, ROUTINE W REFLEX MICROSCOPIC (NOT AT [facility])  CBC WITH DIFFERENTIAL/PLATELET  CBC  TROPONIN I  TROPONIN I  TROPONIN I  PROCALCITONIN  CORTISOL  COMPREHENSIVE METABOLIC PANEL  MAGNESIUM  PHOSPHORUS  BLOOD GAS, ARTERIAL  SODIUM, URINE, RANDOM  CREATININE, URINE, RANDOM  I-STAT TROPOININ, ED  TYPE AND SCREEN    Imaging Review Dg Chest Portable 1 View  08/05/2014   CLINICAL DATA:  Intubation.  Respiratory failure.  EXAM: PORTABLE CHEST - 1 VIEW  COMPARISON:  07/30/2014  FINDINGS: The endotracheal tube tip is 1.9 cm above the  carina. The nasogastric tube extends into the stomach.  There are airspace opacities distributed diffusely throughout the central and basilar regions of both lungs, worsened. No large effusion or pneumothorax is evident. There probably is a small left pleural effusion.  IMPRESSION: Support equipment appears satisfactorily positioned.  Central and basilar airspace opacities are mildly worsened. Probable small left pleural effusion.   Electronically Signed   By: Andreas Newport M.D.   On: 08/05/2014 03:41     EKG Interpretation   Date/Time:  Thursday August 05 2014 03:14:00 EDT Ventricular Rate:  91 PR Interval:  153 QRS Duration: 129 QT Interval:  390 QTC Calculation: 480 R Axis:   -74 Text Interpretation:  Sinus rhythm RBBB and LAFB Confirmed by Claudeen Leason   MD, Sira Adsit ([id number]) on 08/05/2014 6:19:36 AM     CRITICAL CARE Performed by: Lita Mains, Daily Crate Total critical care time: 30 min Critical care time was exclusive of separately billable procedures and treating other patients. Critical care was necessary to treat or prevent imminent or life-threatening deterioration. Critical care was time spent personally by me on the following activities: development of treatment plan with patient and/or surrogate as well as nursing, discussions with consultants, evaluation of patient's response to treatment, examination of patient, obtaining history from patient or surrogate, ordering and performing treatments and interventions, ordering and review of laboratory studies, ordering and review of radiographic studies, pulse oximetry and re-evaluation of patient's condition.  MDM   Final diagnoses:  Acute respiratory failure with hypoxia    I personally performed the services described in this documentation, which was scribed in my presence. The recorded information has been reviewed and is accurate.  On review of patient's previous admission, she was admitted with pulmonary edema and anemia. Given  elevated blood pressure and diffuse infiltrates on chest x-ray concerning for pulmonary edema. Patient was initially started on nitroglycerin drip and Lasix after intubation. She also required propofol for sedation. Patient had precipitous drop in blood pressure necessitating removal of propofol and nitroglycerin with improvement of blood pressure. CCM and consulted.  Julianne Rice, MD 08/05/14 [phone]

## 2014-08-05 NOTE — Progress Notes (Signed)
ANTIBIOTIC CONSULT NOTE - INITIAL  Pharmacy Consult for Vancomycin and Cefepime  Indication: rule out sepsis  Allergies  Allergen Reactions  . Compazine [Prochlorperazine Edisylate]     could not talk or hold head up  . Demerol [Meperidine]     itch, nasal cong  . Dilaudid [Hydromorphone Hcl]     itch  . Novocain [Procaine]     heart races  . Other     "some antibiotic": muscle flexion Tetanus-Diphth-Acell Pertussis: left side numb    Patient Measurements:    Vital Signs: Temp: 99.1 F (37.3 C) (06/30 0515) BP: 89/34 mmHg (06/30 0515) Pulse Rate: 56 (06/30 0515) Intake/Output from previous day:   Intake/Output from this shift:    Labs:  Recent Labs  08/05/14 0324 08/05/14 0330 08/05/14 0404  WBC  --   --  14.4*  HGB  --  11.6* 9.1*  PLT  --   --  391  CREATININE 1.16* 1.10*  --    Estimated Creatinine Clearance: 35.1 mL/min (by C-G formula based on Cr of 1.1). No results for input(s): VANCOTROUGH, VANCOPEAK, VANCORANDOM, GENTTROUGH, GENTPEAK, GENTRANDOM, TOBRATROUGH, TOBRAPEAK, TOBRARND, AMIKACINPEAK, AMIKACINTROU, AMIKACIN in the last 72 hours.   Microbiology: No results found for this or any previous visit (from the past 720 hour(s)).  Medical History: Past Medical History  Diagnosis Date  . Fractured hip 10/2010    fractured left hip, L THR   . On prednisone therapy 2008-2010    Continuous prednisone therapy   . PNA (pneumonia)     w/lung abscess at 13 months of age  . IBS (irritable bowel syndrome)     IBS-constipation  . Palpitations     paroxysmal atrial tachycardia, EF normal, diastolic dysfunction-echo/Holter 10/10 (she did not wish to take medications prescribed, metoprolol/HCTZ)  . Fibromyalgia     jt pains, Tramadol Miller 0-4 per day     Medications:  ASA  Lasix  Zestril  Lopressor  Lexapro  Tramadol    Assessment: 81 y.o. female with VDRF, possible HCAP/sepsis, for empiric antibiotics  Goal of Therapy:  Vancomycin trough  15-20  Plan:  Vancomycin 1 g IV as ordered in ED, then 1 g IV q48h Cefepime 1 g IV q12h   Sopheap Boehle, Bronson Curb 08/05/2014,5:42 AM

## 2014-08-05 NOTE — Procedures (Signed)
Central Venous Catheter Insertion Procedure Note [name] [id number] [birth year]  Procedure: Insertion of Central Venous Catheter Indications: Assessment of intravascular volume, Drug and/or fluid administration and Frequent blood sampling  Procedure Details Consent: Risks of procedure as well as the alternatives and risks of each were explained to the (patient/caregiver).  Consent for procedure obtained. and Unable to obtain consent because of altered level of consciousness. Time Out: Verified patient identification, verified procedure, site/side was marked, verified correct patient position, special equipment/implants available, medications/allergies/relevent history reviewed, required imaging and test results available.  Performed  Maximum sterile technique was used including antiseptics, cap, gloves, gown, hand hygiene, mask and sheet. Skin prep: Chlorhexidine; local anesthetic administered A antimicrobial bonded/coated triple lumen catheter was placed in the left internal jugular vein using the Seldinger technique. Ultrasound guidance used.Yes.   Catheter placed to 20 cm. Blood aspirated via all 3 ports and then flushed x 3. Line sutured x 2 and dressing applied.  Evaluation Blood flow good Complications: No apparent complications Patient did tolerate procedure well. Chest X-ray ordered to verify placement.  CXR: pending.  [name] ACNP [facility] PCCM Pager [phone] till 3 pm If no answer page [phone] 08/05/2014, 11:55 AM  Baltazar Apo, MD, PhD 08/06/2014, 2:07 PM [facility] [phone] or if no answer [phone]

## 2014-08-05 NOTE — Progress Notes (Signed)
Wakeup assessment: patient alert upon morning assesment, coughing and struggling against vent support, moving all extremities. Follows commands. Intubated this morning, will discuss wakeup with MD. Will continue to monitor patient very closely.

## 2014-08-05 NOTE — Progress Notes (Signed)
PULMONARY / CRITICAL CARE MEDICINE   Name: [name] MRN: [id number] DOB: [birth year]    ADMISSION DATE:  08/05/2014 CONSULTATION DATE:  08/05/2014  REFERRING MD :  EDP. Dr Lita Mains  CHIEF COMPLAINT:  SOB  INITIAL PRESENTATION: 81 year old female recently admitted for acute CHF, presented to ED 6/30 with profound SOB, emergently  intubated in ED. PCCM called for admission.   STUDIES:  6/24 echo > LVEF 65-70%, grade 2 DD,  SIGNIFICANT EVENTS: 6/24-25 > admission for acute CHF exacerbation.    HISTORY OF PRESENT ILLNESS:  81 year old female with PMH as below, which includes CHF, HTN, Anemia, and dementia. She was recently admitted to [facility] for CHF exacerbation. She responded well to diuresis and was discharged (6/24-6/25). 6/30 She was at home with her husband when she became acutely SOB and EMS was called. Upon their arrival she was found sitting on the side of bed and was markedly hypertensive with SBP in 230's. She was placed on CPAP en route. She was emergently intubated in ED for continued respiratory distress. CXR concerning for edema and effusion. Questionable PNA. PCCM called for admission.     SUBJECTIVE: awake and follows commands  VITAL SIGNS: Temp:  [97.8 F (36.6 C)-99.7 F (37.6 C)] 97.8 F (36.6 C) (06/30 0645) Pulse Rate:  [49-110] 49 (06/30 0900) Resp:  [12-25] 13 (06/30 0900) BP: (57-193)/(20-119) 118/44 mmHg (06/30 0900) SpO2:  [96 %-100 %] 100 % (06/30 0900) FiO2 (%):  [40 %-100 %] 40 % (06/30 0854) Weight:  [145 lb 8.1 oz (66 kg)] 145 lb 8.1 oz (66 kg) (06/30 0635) HEMODYNAMICS:   VENTILATOR SETTINGS: Vent Mode:  [-] PRVC FiO2 (%):  [40 %-100 %] 40 % Set Rate:  [12 bmp] 12 bmp Vt Set:  [400 mL] 400 mL PEEP:  [5 cmH20] 5 cmH20 Plateau Pressure:  [23 cmH20-24 cmH20] 23 cmH20 INTAKE / OUTPUT:  Intake/Output Summary (Last 24 hours) at 08/05/14 1028 Last data filed at 08/05/14 0900  Gross per 24 hour  Intake 1296.88 ml  Output   1055 ml  Net  241.88 ml    PHYSICAL EXAMINATION: General:  Elderly female on vent in NAD  Neuro:  Alert,HOH, nad, comfortable on vent HEENT:  [place]/AT, PERRL Cardiovascular:  RRR, no MRG Lungs:  Coarse bilateral breath sounds, faint crackles Abdomen:  Soft, non-distended Musculoskeletal:  No acute deformity Skin:  Grossly intact  LABS:  CBC  Recent Labs Lab 07/30/14 0234 08/05/14 0330 08/05/14 0404  WBC 10.5  --  14.4*  HGB 8.7* 11.6* 9.1*  HCT 30.1* 34.0* 31.1*  PLT 324  --  391   Coag's  Recent Labs Lab 07/30/14 0457 08/05/14 0316  INR 1.08 1.14   BMET  Recent Labs Lab 07/31/14 1045 08/05/14 0324 08/05/14 0330 08/05/14 0519  NA 131* 130* 133* 133*  K 4.6 4.9 4.6 4.5  CL 95* 98* 96* 94*  CO2 26 24  --  29  BUN 22* 18 22* 16  CREATININE 1.07* 1.16* 1.10* 1.05*  GLUCOSE 103* 227* 217* 119*   Electrolytes  Recent Labs Lab 07/31/14 1045 08/05/14 0324 08/05/14 0519  CALCIUM 8.9 8.3* 8.6*  MG  --   --  2.2  PHOS  --   --  5.7*   Sepsis Markers  Recent Labs Lab 08/05/14 0519 08/05/14 0524  LATICACIDVEN  --  2.25*  PROCALCITON <0.10  --    ABG  Recent Labs Lab 08/05/14 0606  PHART 7.388  PCO2ART 45.0  PO2ART 87.0   Liver Enzymes  Recent Labs Lab 07/30/14 1720 08/05/14 0324 08/05/14 0519  AST 23 45* 47*  ALT 12* 21 20  ALKPHOS 77 88 93  BILITOT 0.6 0.4 0.6  ALBUMIN 3.4* 3.5 3.5   Cardiac Enzymes  Recent Labs Lab 07/30/14 1035 07/30/14 1720 08/05/14 0519  TROPONINI 0.06* 0.05* 0.07*   Glucose  Recent Labs Lab 08/05/14 0641  GLUCAP 128*    Imaging Dg Chest Portable 1 View  08/05/2014   CLINICAL DATA:  Intubation.  Respiratory failure.  EXAM: PORTABLE CHEST - 1 VIEW  COMPARISON:  07/30/2014  FINDINGS: The endotracheal tube tip is 1.9 cm above the carina. The nasogastric tube extends into the stomach.  There are airspace opacities distributed diffusely throughout the central and basilar regions of both lungs, worsened. No large  effusion or pneumothorax is evident. There probably is a small left pleural effusion.  IMPRESSION: Support equipment appears satisfactorily positioned.  Central and basilar airspace opacities are mildly worsened. Probable small left pleural effusion.   Electronically Signed   By: Andreas Newport M.D.   On: 08/05/2014 03:41     ASSESSMENT / PLAN:  PULMONARY OETT 6/30 >>> A: Acute respiratory failure Pulmonary edema ? Left effusion (not appreciated on bedside US) ? HCAP  P:   Full vent support, wean as tolerated ABG/CXR as needed VAP bundle  CARDIOVASCULAR 6/30 lT I J CVL>>> A:  Acute on chronic diastolic CHF Hypertensive emergency > resolved Hypotension, concern for sepsis vs medication induced  P:  Telemetry monitoring Check troponin 0.07 6/30 Lasix in ED, hold further with hypotension Place CVL,  Since on pressors 6/30. Monitor cvp Levophed would be pressor of choice if so.  Lactic acid 2.25 , follow for clearing Strict I&O, daily weights  RENAL Lab Results  Component Value Date   CREATININE 1.05* 08/05/2014   CREATININE 1.10* 08/05/2014   CREATININE 1.16* 08/05/2014    A:   AKI  P:   Follow Bmet Replace electrolytes as indicated FeNa  GASTROINTESTINAL A:   No acute issues  P:   NPO IV protonix  HEMATOLOGIC  Recent Labs  08/05/14 0330 08/05/14 0404  HGB 11.6* 9.1*    A:   Anemia > improved from previous admit  P:  Follow CBC Coags Transfuse per ICU guidelines  INFECTIOUS A:   SIRS Concern severe sepsis, however source unclear. Possibly pulmonary/HCAP. CxR BASDZ  P:   BCx2 6/30 > UC 6/30 > Sputum 6/30> Abx: cefepime, start date 6/30>>> Abx: vanc, start date 6/30>>>  ENDOCRINE A:   Hyperglycemia without history DM CBG (last 3)   Recent Labs  08/05/14 0641  GLUCAP 128*      P:   CBG monitoring and SSI  NEUROLOGIC A:   Acute metabolic encephalopathy  P:   RASS goal: 0 Could not tolerate propofol or precedex  due to BP drop PRN fentanyl Monitor   FAMILY  - Updates:   - Inter-disciplinary family meet or Palliative Care meeting due by:  7/6  Multiple admits, consider code status review.   Richardson Landry Minor ACNP [facility] PCCM Pager [phone] till 3 pm If no answer page [phone] 08/05/2014, 10:29 AM   Baltazar Apo, MD, PhD 08/05/2014, 11:08 AM [facility] [phone] or if no answer [phone]

## 2014-08-05 NOTE — Progress Notes (Signed)
ANTIBIOTIC CONSULT NOTE - FOLLOW UP  Pharmacy Consult for Vancomycin and Zosyn  Indication: rule out sepsis  Allergies  Allergen Reactions  . Compazine [Prochlorperazine Edisylate]     could not talk or hold head up  . Demerol [Meperidine]     itch, nasal cong  . Dilaudid [Hydromorphone Hcl]     itch  . Novocain [Procaine]     heart races  . Other     "some antibiotic": muscle flexion Tetanus-Diphth-Acell Pertussis: left side numb    Patient Measurements: Height: 5\' 2"  (157.5 cm) Weight: 145 lb 8.1 oz (66 kg) IBW/kg (Calculated) : 50.1  Vital Signs: Temp: 99.7 F (37.6 C) (06/30 1138) Temp Source: Oral (06/30 1138) BP: 118/44 mmHg (06/30 0900) Pulse Rate: 49 (06/30 0900) Intake/Output from previous day: 06/29 0701 - 06/30 0700 In: 1252.5 [I.V.:2.5; IV Piggyback:1250] Out: 1055 [Urine:1055] Intake/Output from this shift: Total I/O In: 44.4 [I.V.:44.4] Out: -   Labs:  Recent Labs  08/05/14 0324 08/05/14 0330 08/05/14 0404 08/05/14 0519 08/05/14 0716  WBC  --   --  14.4*  --   --   HGB  --  11.6* 9.1*  --   --   PLT  --   --  391  --   --   LABCREA  --   --   --   --  <10.00  CREATININE 1.16* 1.10*  --  1.05*  --    Estimated Creatinine Clearance: 37.5 mL/min (by C-G formula based on Cr of 1.05). No results for input(s): VANCOTROUGH, VANCOPEAK, VANCORANDOM, GENTTROUGH, GENTPEAK, GENTRANDOM, TOBRATROUGH, TOBRAPEAK, TOBRARND, AMIKACINPEAK, AMIKACINTROU, AMIKACIN in the last 72 hours.   Microbiology: Recent Results (from the past 720 hour(s))  MRSA PCR Screening     Status: None   Collection Time: 08/05/14  6:42 AM  Result Value Ref Range Status   MRSA by PCR NEGATIVE NEGATIVE Final    Comment:        The GeneXpert MRSA Assay (FDA approved for NASAL specimens only), is one component of a comprehensive MRSA colonization surveillance program. It is not intended to diagnose MRSA infection nor to guide or monitor treatment for MRSA infections.      Medical History: Past Medical History  Diagnosis Date  . Fractured hip 10/2010    fractured left hip, L THR   . On prednisone therapy 2008-2010    Continuous prednisone therapy   . PNA (pneumonia)     w/lung abscess at 19 months of age  . IBS (irritable bowel syndrome)     IBS-constipation  . Palpitations     paroxysmal atrial tachycardia, EF normal, diastolic dysfunction-echo/Holter 10/10 (she did not wish to take medications prescribed, metoprolol/HCTZ)  . Fibromyalgia     jt pains, Tramadol Miller 0-4 per day     Medications:  ASA  Lasix  Zestril  Lopressor  Lexapro  Tramadol    Assessment: 9 YOF who was recently admitted for CHF exacerbation and discharged on 6/25 now readmitted with SOB/VDRF HCAP/sepsis. Currently on Vancomycin and Cefepime for ?HCAP, WBC 14.4, afebrile, LA 2.25, PCT < 0.1. Given drug shortage of Cefepime, switching to IV Zosyn. CrCl improved to 35-40 mL/min   6/30 BCx2>> 6/30 UCx>>  6/30 Trach asp>>   Goal of Therapy:  Vancomycin trough 15-20  Plan:  Stop Cefepime and start Zosyn 3.375 gm IV Q 8 hours Increase Vancomycin dose to 1 gm IV Q 24 hours Monitor CBC, renal fx, cultures and clinical progress VT at [facility]  Albertina Parr, PharmD., BCPS Clinical Pharmacist Pager [phone]

## 2014-08-05 NOTE — ED Notes (Signed)
Per EMS, patient was tripoding trying to catch breath. Became lethargic on the way, wearing cpap on arrival. o2 sat 31% on arrival to er.

## 2014-08-05 NOTE — ED Notes (Signed)
Pt pressure dropped to 50 after propofol and ntg. Both drips stopped and awaiting critical care for orders. Dr Lita Mains made aware.

## 2014-08-05 NOTE — Progress Notes (Signed)
E-MD advised of Hcg.

## 2014-08-05 NOTE — H&P (Signed)
PULMONARY / CRITICAL CARE MEDICINE   Name: [name] MRN: [id number] DOB: 03/24/1930    ADMISSION DATE:  08/05/2014 CONSULTATION DATE:  08/05/2014  REFERRING MD :  EDP. Dr Lita Mains  CHIEF COMPLAINT:  SOB  INITIAL PRESENTATION: 81 year old female recently admitted for acute CHF, presented to ED 6/30 with profound SOB, emergently  intubated in ED. PCCM called for admission.   STUDIES:  6/24 echo > LVEF 65-70%, grade 2 DD,  SIGNIFICANT EVENTS: 6/24-25 > admission for acute CHF exacerbation.   HISTORY OF PRESENT ILLNESS:  81 year old female with PMH as below, which includes CHF, HTN, Anemia, and dementia. She was recently admitted to [facility] for CHF exacerbation. She responded well to diuresis and was discharged (6/24-6/25). 6/30 She was at home with her husband when she became acutely SOB and EMS was called. Upon their arrival she was found sitting on the side of bed and was markedly hypertensive with SBP in 230's. She was placed on CPAP en route. She was emergently intubated in ED for continued respiratory distress. CXR concerning for edema and effusion. Questionable PNA. PCCM called for admission.   PAST MEDICAL HISTORY :   has a past medical history of Fractured hip (10/2010); On prednisone therapy (2008-2010); PNA (pneumonia); IBS (irritable bowel syndrome); Palpitations; and Fibromyalgia.  has past surgical history that includes Lobectomy (1956); Cholecystectomy (1998); Nasal sinus surgery (1982); Cyst excision; and Colonoscopy. Prior to Admission medications   Medication Sig Start Date End Date Taking? Authorizing Provider  aspirin EC 81 MG EC tablet Take 1 tablet (81 mg total) by mouth daily. 07/31/14  Yes Florencia Reasons, MD  clotrimazole-betamethasone (LOTRISONE) cream Apply 1 application topically 2 (two) times daily.   Yes Historical Provider, MD  econazole nitrate 1 % cream Apply 1 application topically 2 (two) times daily.   Yes Historical Provider, MD  escitalopram (LEXAPRO) 10  MG tablet Take 10 mg by mouth daily.   Yes Historical Provider, MD  furosemide (LASIX) 20 MG tablet Take 1 tablet (20 mg total) by mouth every other day. 08/02/14  Yes Florencia Reasons, MD  hydrOXYzine (ATARAX/VISTARIL) 25 MG tablet Take 25 mg by mouth 3 (three) times daily as needed for anxiety. HydrOXYzine HCl 25 Tablet 1/2 to 1 tablet Three times a day Orally 30 days   Yes Historical Provider, MD  lisinopril (PRINIVIL,ZESTRIL) 2.5 MG tablet Take 1 tablet (2.5 mg total) by mouth daily. 08/01/14  Yes Florencia Reasons, MD  metoprolol tartrate (LOPRESSOR) 25 MG tablet Take 0.5 tablets (12.5 mg total) by mouth 2 (two) times daily. 07/31/14  Yes Florencia Reasons, MD  PRESCRIPTION MEDICATION Apply 1 application topically daily. Steroid cream for rash on foot.   Yes Historical Provider, MD  traMADol (ULTRAM) 50 MG tablet Take 50-100 mg by mouth 3 (three) times daily as needed for moderate pain.    Yes Historical Provider, MD   Allergies  Allergen Reactions  . Compazine [Prochlorperazine Edisylate]     could not talk or hold head up  . Demerol [Meperidine]     itch, nasal cong  . Dilaudid [Hydromorphone Hcl]     itch  . Novocain [Procaine]     heart races  . Other     "some antibiotic": muscle flexion Tetanus-Diphth-Acell Pertussis: left side numb    FAMILY HISTORY:  indicated that her mother is deceased. She indicated that her father is deceased. She indicated that her maternal grandmother is deceased. She indicated that her maternal grandfather is deceased. She  indicated that her paternal grandmother is deceased. She indicated that her paternal grandfather is deceased.  SOCIAL HISTORY:  reports that she has never smoked. She has never used smokeless tobacco. She reports that she does not drink alcohol.  REVIEW OF SYSTEMS:  Unable due to encephalopathy/ETT  SUBJECTIVE:   VITAL SIGNS: Pulse Rate:  [78-110] 84 (06/30 0400) Resp:  [17-22] 19 (06/30 0400) BP: (161-193)/(60-119) 161/66 mmHg (06/30 0400) SpO2:  [96 %-100  %] 100 % (06/30 0400) FiO2 (%):  [100 %] 100 % (06/30 0309) HEMODYNAMICS:   VENTILATOR SETTINGS: Vent Mode:  [-] PRVC FiO2 (%):  [100 %] 100 % Set Rate:  [14 bmp] 14 bmp Vt Set:  [400 mL] 400 mL PEEP:  [5 cmH20] 5 cmH20 INTAKE / OUTPUT: No intake or output data in the 24 hours ending 08/05/14 0437  PHYSICAL EXAMINATION: General:  Elderly female on vent in NAD Neuro:  Alert, somewhat agitated on vent HEENT:  [place]/AT, PERRL Cardiovascular:  RRR, no MRG Lungs:  Coarse bilateral breath sounds Abdomen:  Soft, non-distended Musculoskeletal:  No acute deformity Skin:  Grossly intact  LABS:  CBC  Recent Labs Lab 07/30/14 0234 08/05/14 0330 08/05/14 0404  WBC 10.5  --  14.4*  HGB 8.7* 11.6* 9.1*  HCT 30.1* 34.0* 31.1*  PLT 324  --  391   Coag's  Recent Labs Lab 07/30/14 0457  INR 1.08   BMET  Recent Labs Lab 07/30/14 0234 07/31/14 1045 08/05/14 0330  NA 133* 131* 133*  K 4.3 4.6 4.6  CL 99* 95* 96*  CO2 26 26  --   BUN 9 22* 22*  CREATININE 0.86 1.07* 1.10*  GLUCOSE 115* 103* 217*   Electrolytes  Recent Labs Lab 07/30/14 0234 07/31/14 1045  CALCIUM 8.7* 8.9   Sepsis Markers No results for input(s): LATICACIDVEN, PROCALCITON, O2SATVEN in the last 168 hours. ABG No results for input(s): PHART, PCO2ART, PO2ART in the last 168 hours. Liver Enzymes  Recent Labs Lab 07/30/14 1720  AST 23  ALT 12*  ALKPHOS 77  BILITOT 0.6  ALBUMIN 3.4*   Cardiac Enzymes  Recent Labs Lab 07/30/14 0457 07/30/14 1035 07/30/14 1720  TROPONINI 0.04* 0.06* 0.05*   Glucose No results for input(s): GLUCAP in the last 168 hours.  Imaging Dg Chest Portable 1 View  08/05/2014   CLINICAL DATA:  Intubation.  Respiratory failure.  EXAM: PORTABLE CHEST - 1 VIEW  COMPARISON:  07/30/2014  FINDINGS: The endotracheal tube tip is 1.9 cm above the carina. The nasogastric tube extends into the stomach.  There are airspace opacities distributed diffusely throughout the central  and basilar regions of both lungs, worsened. No large effusion or pneumothorax is evident. There probably is a small left pleural effusion.  IMPRESSION: Support equipment appears satisfactorily positioned.  Central and basilar airspace opacities are mildly worsened. Probable small left pleural effusion.   Electronically Signed   By: Andreas Newport M.D.   On: 08/05/2014 03:41     ASSESSMENT / PLAN:  PULMONARY OETT 6/30 >>> A: Acute respiratory failure Pulmonary edema ? Left effusion (not appreciated on bedside US) ? HCAP P:   Full vent support ABG/CXR VAP bundle  CARDIOVASCULAR A:  Acute on chronic diastolic CHF Hypertensive emergency > resolved Hypotension, concern for sepsis vs medication induced P:  Telemetry monitoring Check troponin Lasix in ED, hold further with hypotension May need CVL, pressors. Levophed would be pressor of choice if so.  Check troponin, lactic Strict I&O, daily weights  RENAL A:  AKI  P:   Follow Bmet Replace electrolytes as indicated FeNa  GASTROINTESTINAL A:   No acute issues  P:   NPO IV protonix  HEMATOLOGIC A:   Anemia > improved from previous admit  P:  Follow CBC Coags Transfuse per ICU guidelines  INFECTIOUS A:   SIRS Concern severe sepsis, however source unclear. Possibly pulmonary/HCAP  P:   BCx2 6/30 > UC 6/30 > Sputum 6/30> Abx: cefepime, start date 6/30>>> Abx: vanc, start date 6/30>>>  ENDOCRINE A:   Hyperglycemia without history DM  P:   CBG monitoring and SSI  NEUROLOGIC A:   Acute metabolic encephalopathy  P:   RASS goal: 0 Could not tolerate propofol or precedex due to BP drop PRN fentanyl Monitor   FAMILY  - Updates:   - Inter-disciplinary family meet or Palliative Care meeting due by:  7/6   Georgann Housekeeper, AGACNP-BC [facility] Pager [phone] or [phone] 08/05/2014 5:26 AM   Attending Note:  I have examined patient, reviewed labs, studies  and notes. I have discussed the case with Jaclynn Guarneri, and I agree with the data and plans as amended above. Hx dementia, diastolic CHF. Just discharged with B infiltrates and presumed acute CHF. Now readmitted with worsening infiltrates and dyspnea, acute resp failure consistent with recurrent acute on chronic diastolic CHF. She required intubation and has been hypotensive since. On my eval she is wide awake, follows commands, tolerates MV and brief change to PSV. Etiology of her shock is unclear but must consider effects of sedation, possible septic shock.  Independent critical care time is 50 minutes.   Baltazar Apo, MD, PhD 08/05/2014, 10:57 AM [facility] [phone] or if no answer [phone]

## 2014-08-05 NOTE — Procedures (Signed)
Pt transported from trauma B to 2M09 by RT, RN and aid without complications.

## 2014-08-05 NOTE — ED Notes (Signed)
Portable x-ray at the bedside.  

## 2014-08-06 ENCOUNTER — Inpatient Hospital Stay (HOSPITAL_COMMUNITY): Payer: Medicare Other

## 2014-08-06 DIAGNOSIS — R579 Shock, unspecified: Secondary | ICD-10-CM | POA: Diagnosis present

## 2014-08-06 DIAGNOSIS — I5031 Acute diastolic (congestive) heart failure: Secondary | ICD-10-CM | POA: Diagnosis present

## 2014-08-06 LAB — CBC
HCT: 26.7 % — ABNORMAL LOW (ref 36.0–46.0)
HEMOGLOBIN: 8 g/dL — AB (ref 12.0–15.0)
MCH: 19 pg — AB (ref 26.0–34.0)
MCHC: 30 g/dL (ref 30.0–36.0)
MCV: 63.4 fL — ABNORMAL LOW (ref 78.0–100.0)
Platelets: 329 10*3/uL (ref 150–400)
RBC: 4.21 MIL/uL (ref 3.87–5.11)
RDW: 18.9 % — ABNORMAL HIGH (ref 11.5–15.5)
WBC: 11.3 10*3/uL — ABNORMAL HIGH (ref 4.0–10.5)

## 2014-08-06 LAB — GLUCOSE, CAPILLARY
GLUCOSE-CAPILLARY: 106 mg/dL — AB (ref 65–99)
GLUCOSE-CAPILLARY: 114 mg/dL — AB (ref 65–99)
Glucose-Capillary: 142 mg/dL — ABNORMAL HIGH (ref 65–99)
Glucose-Capillary: 118 mg/dL — ABNORMAL HIGH (ref 65–99)
Glucose-Capillary: 95 mg/dL (ref 65–99)
Glucose-Capillary: 96 mg/dL (ref 65–99)

## 2014-08-06 LAB — BASIC METABOLIC PANEL
ANION GAP: 10 (ref 5–15)
BUN: 17 mg/dL (ref 6–20)
CALCIUM: 8 mg/dL — AB (ref 8.9–10.3)
CHLORIDE: 94 mmol/L — AB (ref 101–111)
CO2: 27 mmol/L (ref 22–32)
Creatinine, Ser: 1.21 mg/dL — ABNORMAL HIGH (ref 0.44–1.00)
GFR calc Af Amer: 47 mL/min — ABNORMAL LOW (ref >60)
GFR calc non Af Amer: 41 mL/min — ABNORMAL LOW (ref >60)
GLUCOSE: 134 mg/dL — AB (ref 65–99)
POTASSIUM: 4 mmol/L (ref 3.5–5.1)
Sodium: 131 mmol/L — ABNORMAL LOW (ref 135–145)

## 2014-08-06 LAB — URINE CULTURE: CULTURE: NO GROWTH

## 2014-08-06 LAB — PHOSPHORUS: Phosphorus: 3.4 mg/dL (ref 2.5–4.6)

## 2014-08-06 LAB — MAGNESIUM: MAGNESIUM: 1.7 mg/dL (ref 1.7–2.4)

## 2014-08-06 MED ORDER — DEXMEDETOMIDINE HCL IN NACL 200 MCG/50ML IV SOLN
0.4000 ug/kg/h | INTRAVENOUS | Status: DC
  Administered 2014-08-06: 0.4 ug/kg/h via INTRAVENOUS
  Administered 2014-08-06 – 2014-08-07 (×2): 0.3 ug/kg/h via INTRAVENOUS
  Filled 2014-08-06 (×3): qty 50

## 2014-08-06 MED ORDER — FENTANYL CITRATE (PF) 100 MCG/2ML IJ SOLN
50.0000 ug | INTRAMUSCULAR | Status: DC | PRN
  Administered 2014-08-07: 50 ug via INTRAVENOUS
  Administered 2014-08-07: 100 ug via INTRAVENOUS
  Filled 2014-08-06 (×2): qty 2

## 2014-08-06 NOTE — Progress Notes (Signed)
RN requested for pt to be placed back on full vent support d/t pt getting ready to receive sedation.

## 2014-08-06 NOTE — Progress Notes (Signed)
Dr. Martinique called RN with report that ET tip was at the right mainstem bronchus. Recommended that tube be withdrawal 2-3cm . Mickeal Needy, RT, was notified. MD notified.

## 2014-08-06 NOTE — Progress Notes (Signed)
[facility] Progress Note Patient Name: DYLAN RUOTOLO DOB: 10/27/1930 MRN: [id number]  Date of Service  08/06/2014   HPI/Events of Note   Restraint reneweed  eICU Interventions      category: minor  Sharayah Renfrow 08/06/2014, 10:25 PM

## 2014-08-06 NOTE — Progress Notes (Signed)
PULMONARY / CRITICAL CARE MEDICINE   Name: [name] MRN: [id number] DOB: [birth year]    ADMISSION DATE:  08/05/2014 CONSULTATION DATE:  08/05/2014  REFERRING MD :  EDP. Dr Lita Mains  CHIEF COMPLAINT:  SOB  INITIAL PRESENTATION: 81 year old female recently admitted for acute CHF, presented to ED 6/30 with profound SOB, emergently  intubated in ED. PCCM called for admission.   STUDIES:  6/24 echo > LVEF 65-70%, grade 2 DD,  SIGNIFICANT EVENTS: 6/24-25 > admission for acute CHF exacerbation.    HISTORY OF PRESENT ILLNESS:  81 year old female with PMH as below, which includes CHF, HTN, Anemia, and dementia. She was recently admitted to [facility] for CHF exacerbation. She responded well to diuresis and was discharged (6/24-6/25). 6/30 She was at home with her husband when she became acutely SOB and EMS was called. Upon their arrival she was found sitting on the side of bed and was markedly hypertensive with SBP in 230's. She was placed on CPAP en route. She was emergently intubated in ED for continued respiratory distress. CXR concerning for edema and effusion. Questionable PNA. PCCM called for admission.    SUBJECTIVE:  norepi weaned to 2 Awake and comfortable Has been net positive  VITAL SIGNS: Temp:  [99.3 F (37.4 C)-100.1 F (37.8 C)] 100.1 F (37.8 C) (07/01 1132) Pulse Rate:  [47-94] 75 (07/01 1400) Resp:  [11-26] 18 (07/01 1400) BP: (71-158)/(28-136) 126/57 mmHg (07/01 1400) SpO2:  [94 %-100 %] 100 % (07/01 1400) FiO2 (%):  [40 %] 40 % (07/01 1139) Weight:  [69.8 kg (153 lb 14.1 oz)] 69.8 kg (153 lb 14.1 oz) (07/01 0353) HEMODYNAMICS: CVP:  [8 mmHg-73 mmHg] 10 mmHg VENTILATOR SETTINGS: Vent Mode:  [-] PSV FiO2 (%):  [40 %] 40 % Set Rate:  [12 bmp] 12 bmp Vt Set:  [400 mL] 400 mL PEEP:  [5 cmH20] 5 cmH20 Pressure Support:  [14 cmH20] 14 cmH20 Plateau Pressure:  [21 cmH20-28 cmH20] 21 cmH20 INTAKE / OUTPUT:  Intake/Output Summary (Last 24 hours) at 08/06/14  1408 Last data filed at 08/06/14 1200  Gross per 24 hour  Intake 1131.06 ml  Output    990 ml  Net 141.06 ml    PHYSICAL EXAMINATION: General:  Elderly female on vent in NAD  Neuro:  Alert,HOH, nad, comfortable on vent HEENT:  [place]/AT, PERRL Cardiovascular:  RRR, no MRG Lungs:  Coarse bilateral breath sounds, faint crackles Abdomen:  Soft, non-distended Musculoskeletal:  No acute deformity Skin:  Grossly intact  LABS:  CBC  Recent Labs Lab 08/05/14 1718 08/05/14 2250 08/06/14 0226  WBC 12.6* 10.8* 11.3*  HGB 8.0* 7.8* 8.0*  HCT 26.7* 26.4* 26.7*  PLT 340 324 329   Coag's  Recent Labs Lab 08/05/14 0316  INR 1.14   BMET  Recent Labs Lab 08/05/14 0324 08/05/14 0330 08/05/14 0519 08/06/14 0226  NA 130* 133* 133* 131*  K 4.9 4.6 4.5 4.0  CL 98* 96* 94* 94*  CO2 24  --  29 27  BUN 18 22* 16 17  CREATININE 1.16* 1.10* 1.05* 1.21*  GLUCOSE 227* 217* 119* 134*   Electrolytes  Recent Labs Lab 08/05/14 0324 08/05/14 0519 08/06/14 0226  CALCIUM 8.3* 8.6* 8.0*  MG  --  2.2 1.7  PHOS  --  5.7* 3.4   Sepsis Markers  Recent Labs Lab 08/05/14 0519 08/05/14 0524 08/05/14 1203  LATICACIDVEN  --  2.25* 1.2  PROCALCITON <0.10  --   --  ABG  Recent Labs Lab 08/05/14 0606  PHART 7.388  PCO2ART 45.0  PO2ART 87.0   Liver Enzymes  Recent Labs Lab 07/30/14 1720 08/05/14 0324 08/05/14 0519  AST 23 45* 47*  ALT 12* 21 20  ALKPHOS 77 88 93  BILITOT 0.6 0.4 0.6  ALBUMIN 3.4* 3.5 3.5   Cardiac Enzymes  Recent Labs Lab 08/05/14 0519 08/05/14 1150 08/05/14 2035  TROPONINI 0.07* 0.08* 0.07*   Glucose  Recent Labs Lab 08/05/14 1624 08/05/14 2029 08/05/14 2348 08/06/14 0402 08/06/14 0754 08/06/14 1130  GLUCAP 104* 126* 114* 142* 118* 106*    Imaging Dg Chest Port 1 View  08/06/2014   CLINICAL DATA:  Respiratory failure, history of pneumonia  EXAM: PORTABLE CHEST - 1 VIEW  COMPARISON:  Portable chest x-ray of August 05, 2014  FINDINGS:  The lungs are reasonably well inflated. The endotracheal tube tip lies at the origin of the right mainstem bronchus. The pulmonary interstitial markings remain increased. A small amount of pleural fluid on the left is present. The cardiac silhouette is enlarged. The left internal jugular venous catheter tip projects over the proximal SVC. The tip of the esophagogastric tube projects below the inferior margin of the study.  IMPRESSION: Endotracheal tube tip at at the origin of the right mainstem bronchus. Withdrawal by 2-3 cm is recommended. The pulmonary interstitium has improved somewhat since yesterday's study consistent with resolving pneumonia.  These results were called by telephone at the time of interpretation on 08/06/2014 at 7:38 am to Alessandra Grout, RN, who verbally acknowledged these results.   Electronically Signed   By: David  Martinique M.D.   On: 08/06/2014 07:40   CXR 7/1 reviewed by me >> ETT in R mainstem, some improvement B infiltrates   ASSESSMENT / PLAN:  PULMONARY OETT 6/30 >>> A: Acute respiratory failure Pulmonary edema ? Left effusion (not appreciated on bedside US) ? HCAP  P:   ETT pulled back am 7/1 Full vent support, wean as tolerated ABG/CXR as needed VAP bundle Diuresis as able; currently net positive (on pressors)   CARDIOVASCULAR 6/30 lT I J CVL>>> A:  Acute on chronic diastolic CHF Hypertensive emergency > resolved Hypotension, concern for sepsis vs medication induced  P:  Telemetry monitoring Follow CVP, currently 10 Lasix in ED, hold further with hypotension Continue levophed Strict I&O, daily weights  RENAL Lab Results  Component Value Date   CREATININE 1.21* 08/06/2014   CREATININE 1.05* 08/05/2014   CREATININE 1.10* 08/05/2014   A:   AKI  P:   Follow Bmet Replace electrolytes as indicated FeNa  GASTROINTESTINAL A:   No acute issues  P:   NPO IV protonix  HEMATOLOGIC  Recent Labs  08/05/14 2250 08/06/14 0226  HGB 7.8* 8.0*     A:   Anemia > improved from previous admit  P:  Follow CBC Coags Transfuse per ICU guidelines  INFECTIOUS A:   Possible septic shock Possible /HCAP  P:   BCx2 6/30 > UC 6/30 > Sputum 6/30> Abx: cefepime, start date 6/30>>> Abx: vanc, start date 6/30>>>  ENDOCRINE A:   Hyperglycemia without history DM P:   CBG monitoring and SSI  NEUROLOGIC A:   Acute metabolic encephalopathy, improved 7/1  P:   RASS goal: 0 PRN fentanyl Monitor   FAMILY  - Updates:   - Inter-disciplinary family meet or Palliative Care meeting due by:  7/6  Multiple admits, consider code status review.  Critical care time 35 minutes  Baltazar Apo, MD, PhD  08/06/2014, 2:08 PM [facility] [phone] or if no answer [phone]

## 2014-08-06 NOTE — Progress Notes (Signed)
RN reported that cxray w/ OET in right mainstem, noted cxray results.  Breath sounds heard w/ good air movement on right, and diminished lung sounds on left.  OET pulled back 2 cm, BBSH= w/ good air movement throughout.

## 2014-08-06 NOTE — Progress Notes (Signed)
eLink Physician-Brief Progress Note Patient Name: DEMONICA FARREY DOB: 1930-06-07 MRN: [id number]  Date of Service  08/06/2014   HPI/Events of Note   apatient on prn fentanyl and agitated despite fentanyl push. Off pressors on   eICU Interventions  Increase fent prn Start precedex gtt Cont versed prn      Lakisa Lotz 08/06/2014, 5:03 PM

## 2014-08-06 NOTE — Progress Notes (Signed)
Initial Nutrition Assessment  INTERVENTION:  Tube feeding recommendations if pt remains intubated > 24hours: Initiate TF via OGT with Vital AF 1.2 at 25 ml/h and Prostat 30 ml BID on day 1; on day 2, increase to goal rate of 45 ml/h (1080 ml per day) and decrease to 30 ml Pro-stat once daily to provide 1396 kcals, 96 gm protein, 875 ml free water daily.   NUTRITION DIAGNOSIS:  Inadequate oral intake related to inability to eat as evidenced by NPO status.  GOAL:  Patient will meet greater than or equal to 90% of their needs  MONITOR:  Vent status, Weight trends, TF tolerance, Skin, I & O's  REASON FOR ASSESSMENT:  Ventilator   ASSESSMENT: 81 year old female recently admitted for acute CHF, presented to ED 6/30 with profound SOB, emergently intubated in ED.   Pt awake on vent at time of visit. OGT in place to suction. Some mild muscle wasting in legs but, otherwise appears well-nourished.   Patient is currently intubated on ventilator support MV: 6.2 L/min Temp (24hrs), Avg:99.6 F (37.6 C), Min:99.3 F (37.4 C), Max:100.1 F (37.8 C)  Propofol: none  Labs: low hemoglobin, low sodium, low chloride, low calcium   Height:  Ht Readings from Last 1 Encounters:  08/05/14 5\' 2"  (1.575 m)    Weight:  Wt Readings from Last 1 Encounters:  08/06/14 153 lb 14.1 oz (69.8 kg)    Ideal Body Weight:  50 kg  Wt Readings from Last 10 Encounters:  08/06/14 153 lb 14.1 oz (69.8 kg)  07/30/14 139 lb 8 oz (63.277 kg)    BMI:  Body mass index is 28.14 kg/(m^2).  Estimated Nutritional Needs:  Kcal:  1370  Protein:  90-110 grams  Fluid:  1.8 L/day  Skin:  Reviewed, no issues  Diet Order:  Diet NPO time specified  EDUCATION NEEDS:  No education needs identified at this time   Intake/Output Summary (Last 24 hours) at 08/06/14 1054 Last data filed at 08/06/14 0900  Gross per 24 hour  Intake 1455.15 ml  Output   1180 ml  Net 275.15 ml    Last BM:   PTA  Pryor Ochoa RD, LDN Inpatient Clinical Dietitian Pager: [phone] After Hours Pager: [phone]

## 2014-08-06 NOTE — Progress Notes (Signed)
Late entry- Dr Grace Blight notified of cxray results re: OET placement.  MD aware OET already pulled back 2 cm to 20cm at lip, post cxray.  No further orders re: OET received.

## 2014-08-07 ENCOUNTER — Inpatient Hospital Stay (HOSPITAL_COMMUNITY): Payer: Medicare Other

## 2014-08-07 DIAGNOSIS — J9600 Acute respiratory failure, unspecified whether with hypoxia or hypercapnia: Secondary | ICD-10-CM | POA: Insufficient documentation

## 2014-08-07 DIAGNOSIS — J9601 Acute respiratory failure with hypoxia: Secondary | ICD-10-CM | POA: Insufficient documentation

## 2014-08-07 DIAGNOSIS — I5031 Acute diastolic (congestive) heart failure: Secondary | ICD-10-CM

## 2014-08-07 LAB — BASIC METABOLIC PANEL
ANION GAP: 9 (ref 5–15)
BUN: 11 mg/dL (ref 6–20)
CHLORIDE: 95 mmol/L — AB (ref 101–111)
CO2: 26 mmol/L (ref 22–32)
Calcium: 8.1 mg/dL — ABNORMAL LOW (ref 8.9–10.3)
Creatinine, Ser: 1.07 mg/dL — ABNORMAL HIGH (ref 0.44–1.00)
GFR calc non Af Amer: 47 mL/min — ABNORMAL LOW (ref >60)
GFR, EST AFRICAN AMERICAN: 55 mL/min — AB (ref >60)
GLUCOSE: 98 mg/dL (ref 65–99)
Potassium: 3.6 mmol/L (ref 3.5–5.1)
Sodium: 130 mmol/L — ABNORMAL LOW (ref 135–145)

## 2014-08-07 LAB — CBC
HCT: 24.1 % — ABNORMAL LOW (ref 36.0–46.0)
HEMOGLOBIN: 7.2 g/dL — AB (ref 12.0–15.0)
MCH: 18.8 pg — ABNORMAL LOW (ref 26.0–34.0)
MCHC: 29.9 g/dL — AB (ref 30.0–36.0)
MCV: 62.9 fL — AB (ref 78.0–100.0)
Platelets: 273 10*3/uL (ref 150–400)
RBC: 3.83 MIL/uL — ABNORMAL LOW (ref 3.87–5.11)
RDW: 19 % — ABNORMAL HIGH (ref 11.5–15.5)
WBC: 10.6 10*3/uL — AB (ref 4.0–10.5)

## 2014-08-07 LAB — MAGNESIUM: Magnesium: 1.8 mg/dL (ref 1.7–2.4)

## 2014-08-07 LAB — CULTURE, RESPIRATORY W GRAM STAIN: Culture: NO GROWTH

## 2014-08-07 LAB — GLUCOSE, CAPILLARY
GLUCOSE-CAPILLARY: 91 mg/dL (ref 65–99)
Glucose-Capillary: 101 mg/dL — ABNORMAL HIGH (ref 65–99)
Glucose-Capillary: 83 mg/dL (ref 65–99)
Glucose-Capillary: 133 mg/dL — ABNORMAL HIGH (ref 65–99)
Glucose-Capillary: 88 mg/dL (ref 65–99)

## 2014-08-07 LAB — BRAIN NATRIURETIC PEPTIDE
B NATRIURETIC PEPTIDE 5: 326.9 pg/mL — AB (ref 0.0–100.0)
B Natriuretic Peptide: 359.2 pg/mL — ABNORMAL HIGH (ref 0.0–100.0)

## 2014-08-07 LAB — PHOSPHORUS: Phosphorus: 3 mg/dL (ref 2.5–4.6)

## 2014-08-07 MED ORDER — HALOPERIDOL LACTATE 5 MG/ML IJ SOLN
2.0000 mg | INTRAMUSCULAR | Status: DC | PRN
  Administered 2014-08-07: 2 mg via INTRAVENOUS
  Filled 2014-08-07: qty 1

## 2014-08-07 MED ORDER — CEFTRIAXONE SODIUM IN DEXTROSE 20 MG/ML IV SOLN
1.0000 g | INTRAVENOUS | Status: DC
  Administered 2014-08-07 – 2014-08-08 (×2): 1 g via INTRAVENOUS
  Filled 2014-08-07 (×3): qty 50

## 2014-08-07 MED ORDER — METOPROLOL TARTRATE 1 MG/ML IV SOLN
2.5000 mg | INTRAVENOUS | Status: DC | PRN
  Administered 2014-08-07 – 2014-08-09 (×2): 2.5 mg via INTRAVENOUS
  Filled 2014-08-07 (×2): qty 5

## 2014-08-07 NOTE — Progress Notes (Signed)
Called critical care MD Wert, advised of HR in 140-150's, was told "that's OK", no new orders received.  Patient resting quietly, no distress noted, will monitor.

## 2014-08-07 NOTE — Progress Notes (Signed)
Pt placed in 5W18.  Oriented pt and husband to room.  Will continue to monitor.

## 2014-08-07 NOTE — Progress Notes (Signed)
Orders received to extubate pt. RT was called and RT extubated pt with RN present. No complication. Pt is alert, vital signs stable. Family called but no answer. RN will continue to monitor pt close and attempt to contact family.

## 2014-08-07 NOTE — Progress Notes (Signed)
Report given to nurse on 5W. Pt is going to 5W18.

## 2014-08-07 NOTE — Procedures (Signed)
Extubation Procedure Note  Patient Details:   Name: ALLIA WILTSEY DOB: 02-21-1930 MRN: [id number]   Airway Documentation:  Airway 7 mm (Active)  Secured at (cm) 20 cm 08/07/2014  8:02 AM  Measured From Lips 08/07/2014  8:02 AM  [place] 08/07/2014  8:02 AM  Secured By [employer] 08/07/2014  8:02 AM  Tube Holder Repositioned Yes 08/07/2014  8:02 AM  Cuff Pressure (cm H2O) 22 cm H2O 08/07/2014  8:02 AM  Site Condition Dry 08/07/2014  8:02 AM   Extubated to 4lpm [place], incentive spirometer achieved 500ccs x 10breaths. Evaluation  O2 sats: stable throughout Complications: No apparent complications Patient did tolerate procedure well. Bilateral Breath Sounds: Clear Suctioning: Oral Yes  Donella Stade 08/07/2014, 12:10 PM

## 2014-08-07 NOTE — Progress Notes (Signed)
Called critical MD Wert again and advised patient flipping back and forth ST-AFib, states he will look at her meds and if has any changes will let me know.

## 2014-08-07 NOTE — Progress Notes (Signed)
eLink Physician-Brief Progress Note Patient Name: [name] DOB: Mar 31, 1930 MRN: [id number]   Date of Service  08/07/2014  HPI/Events of Note  Agitated earlier, variable tachycardia/ ? Going into afib  eICU Interventions  Lopressor 2.5 mg q 4 h prn IV      Intervention Category Major Interventions: Arrhythmia - evaluation and management  Christinia Gully 08/07/2014, 11:05 PM

## 2014-08-07 NOTE — Progress Notes (Signed)
PULMONARY / CRITICAL CARE MEDICINE   Name: [name] MRN: [id number] DOB: 10-14-1930    ADMISSION DATE:  08/05/2014 CONSULTATION DATE:  08/05/2014  REFERRING MD :  EDP. Dr Lita Mains  CHIEF COMPLAINT:  SOB  INITIAL PRESENTATION: 81 year old female recently admitted for acute CHF, presented to ED 6/30 with profound SOB, emergently  intubated in ED. PCCM called for admission.   STUDIES:  6/24 echo > LVEF 65-70%, grade 2 DD,  SIGNIFICANT EVENTS: 6/24-25 > admission for acute CHF exacerbation.    SUBJECTIVE:  Agitated overnight.  Now on precedex.   VITAL SIGNS: Temp:  [98.1 F (36.7 C)-102.5 F (39.2 C)] 98.2 F (36.8 C) (07/02 0813) Pulse Rate:  [28-113] 65 (07/02 0815) Resp:  [11-26] 19 (07/02 0815) BP: (76-186)/(32-171) 138/44 mmHg (07/02 0815) SpO2:  [81 %-100 %] 100 % (07/02 0815) FiO2 (%):  [40 %] 40 % (07/02 0802) Weight:  [149 lb 4 oz (67.7 kg)] 149 lb 4 oz (67.7 kg) (07/02 0448) HEMODYNAMICS: CVP:  [7 mmHg-12 mmHg] 12 mmHg VENTILATOR SETTINGS: Vent Mode:  [-] PSV FiO2 (%):  [40 %] 40 % Set Rate:  [12 bmp] 12 bmp Vt Set:  [400 mL] 400 mL PEEP:  [5 cmH20] 5 cmH20 Pressure Support:  [14 cmH20] 14 cmH20 Plateau Pressure:  [14 cmH20-23 cmH20] 23 cmH20 INTAKE / OUTPUT:  Intake/Output Summary (Last 24 hours) at 08/07/14 1108 Last data filed at 08/07/14 1100  Gross per 24 hour  Intake 858.46 ml  Output    635 ml  Net 223.46 ml    PHYSICAL EXAMINATION: General:  Elderly female on vent in NAD  Neuro:  Alert,HOH, nad, comfortable on vent HEENT:  [place]/AT, PERRL Cardiovascular:  RRR, no MRG Lungs:  Coarse bilateral breath sounds, faint crackles Abdomen:  Soft, non-distended Musculoskeletal:  No acute deformity Skin:  Grossly intact  LABS:  CBC  Recent Labs Lab 08/05/14 2250 08/06/14 0226 08/07/14 0500  WBC 10.8* 11.3* 10.6*  HGB 7.8* 8.0* 7.2*  HCT 26.4* 26.7* 24.1*  PLT 324 329 273   Coag's  Recent Labs Lab 08/05/14 0316  INR 1.14    BMET  Recent Labs Lab 08/05/14 0519 08/06/14 0226 08/07/14 0500  NA 133* 131* 130*  K 4.5 4.0 3.6  CL 94* 94* 95*  CO2 29 27 26   BUN 16 17 11   CREATININE 1.05* 1.21* 1.07*  GLUCOSE 119* 134* 98   Electrolytes  Recent Labs Lab 08/05/14 0519 08/06/14 0226 08/07/14 0500  CALCIUM 8.6* 8.0* 8.1*  MG 2.2 1.7 1.8  PHOS 5.7* 3.4 3.0   Sepsis Markers  Recent Labs Lab 08/05/14 0519 08/05/14 0524 08/05/14 1203  LATICACIDVEN  --  2.25* 1.2  PROCALCITON <0.10  --   --    ABG  Recent Labs Lab 08/05/14 0606  PHART 7.388  PCO2ART 45.0  PO2ART 87.0   Liver Enzymes  Recent Labs Lab 08/05/14 0324 08/05/14 0519  AST 45* 47*  ALT 21 20  ALKPHOS 88 93  BILITOT 0.4 0.6  ALBUMIN 3.5 3.5   Cardiac Enzymes  Recent Labs Lab 08/05/14 0519 08/05/14 1150 08/05/14 2035  TROPONINI 0.07* 0.08* 0.07*   Glucose  Recent Labs Lab 08/06/14 0754 08/06/14 1130 08/06/14 1546 08/06/14 1938 08/07/14 0339 08/07/14 0815  GLUCAP 118* 106* 95 96 101* 83    Imaging Dg Chest Port 1 View  08/07/2014   CLINICAL DATA:  Acute respiratory failure  EXAM: PORTABLE CHEST - 1 VIEW  COMPARISON:  the previous day's study  FINDINGS: Endotracheal tube, nasogastric tube, and left IJ central line are stable in position. Improved aeration bilaterally. Chronic blunting of left lateral costophrenic angle. Stable left hilar fullness. Postop or posttraumatic changes to several contiguous left ribs. Heart size upper limits normal for technique. No pneumothorax. Atheromatous aorta.  IMPRESSION: 1.  Support hardware stable in position. 2. Slightly improved aeration since previous exam.   Electronically Signed   By: Lucrezia Europe M.D.   On: 08/07/2014 08:34    ASSESSMENT / PLAN:  PULMONARY OETT 6/30 >>> A: Acute respiratory failure Pulmonary edema ? Left effusion (not appreciated on bedside US) ? HCAP P:   SBT - hopeful for extubation if can be a bit more awake without significant agitation   ABG/CXR as needed VAP bundle Diuresis as below    CARDIOVASCULAR 6/30 lT I J CVL>>> A:  Acute on chronic diastolic CHF Hypertensive emergency > resolved Hypotension, concern for sepsis vs medication induced P:  Telemetry monitoring Hold lasix for now with mild hypotension but likely need additional  Strict I&O, daily weights F/u BNP    RENAL A:   AKI P:   Follow Bmet Replace electrolytes as indicated   GASTROINTESTINAL A:   No acute issues P:   NPO IV protonix   HEMATOLOGIC A:   Anemia > improved from previous admit P:  Follow CBC Coags Transfuse per ICU guidelines   INFECTIOUS A:   Possible septic shock Possible /HCAP P:   BCx2 6/30 > UC 6/30 > Sputum 6/30> Abx: cefepime 6/30>>> Abx: vanc 6/30>>>  Consider d/c abx with neg pct, likely all edema - cont for now while still borderline hypotension and await cultures   ENDOCRINE A:   Hyperglycemia without history DM P:   CBG monitoring and SSI   NEUROLOGIC A:   Acute metabolic encephalopathy, improved 7/1 P:   RASS goal: 0 PRN fentanyl Monitor   FAMILY  - Updates:  Husband updated at bedside   - Inter-disciplinary family meet or Palliative Care meeting due by:  7/6   Nickolas Madrid, NP 08/07/2014  11:08 AM Pager: [phone] or [phone]  STAFF NOTE: Linwood Dibbles, MD FACP have personally reviewed patient's available data, including medical history, events of note, physical examination and test results as part of my evaluation. I have discussed with resident/NP and other care providers such as pharmacist, RN and RRT. In addition, I personally evaluated patient and elicited key findings of: more awake, weaning now, cpap  5 ps 5, spont TV are good, she protects airway well, pcxr has cleared with initial lasix given, BP is better overall, she remains culture neg, narrow off vanc, zosyn, add ceftriaxone for now given BP sluggish to resolve as could relate to sepsis, prexcedex  to WUA The patient is critically ill with multiple organ systems failure and requires high complexity decision making for assessment and support, frequent evaluation and titration of therapies, application of advanced monitoring technologies and extensive interpretation of multiple databases.   Critical Care Time devoted to patient care services described in this note is30 Minutes. This time reflects time of care of this signee: Merrie Roof, MD FACP. This critical care time does not reflect procedure time, or teaching time or supervisory time of PA/NP/Med student/Med Resident etc but could involve care discussion time. Rest per NP/medical resident whose note is outlined above and that I agree with   Lavon Paganini. Titus Mould, MD, [place] Pgr: [facility] 08/07/2014 11:40 AM

## 2014-08-08 LAB — CBC
HEMATOCRIT: 25.6 % — AB (ref 36.0–46.0)
HEMOGLOBIN: 7.5 g/dL — AB (ref 12.0–15.0)
MCH: 18.9 pg — ABNORMAL LOW (ref 26.0–34.0)
MCHC: 29.3 g/dL — AB (ref 30.0–36.0)
MCV: 64.5 fL — AB (ref 78.0–100.0)
Platelets: 273 10*3/uL (ref 150–400)
RBC: 3.97 MIL/uL (ref 3.87–5.11)
RDW: 19.3 % — ABNORMAL HIGH (ref 11.5–15.5)
WBC: 8.8 10*3/uL (ref 4.0–10.5)

## 2014-08-08 LAB — BASIC METABOLIC PANEL
Anion gap: 12 (ref 5–15)
BUN: 8 mg/dL (ref 6–20)
CALCIUM: 8.5 mg/dL — AB (ref 8.9–10.3)
CO2: 23 mmol/L (ref 22–32)
Chloride: 99 mmol/L — ABNORMAL LOW (ref 101–111)
Creatinine, Ser: 0.9 mg/dL (ref 0.44–1.00)
GFR calc non Af Amer: 58 mL/min — ABNORMAL LOW (ref >60)
GLUCOSE: 80 mg/dL (ref 65–99)
Potassium: 4 mmol/L (ref 3.5–5.1)
Sodium: 134 mmol/L — ABNORMAL LOW (ref 135–145)

## 2014-08-08 LAB — GLUCOSE, CAPILLARY
GLUCOSE-CAPILLARY: 92 mg/dL (ref 65–99)
GLUCOSE-CAPILLARY: 90 mg/dL (ref 65–99)
GLUCOSE-CAPILLARY: 112 mg/dL — AB (ref 65–99)
Glucose-Capillary: 104 mg/dL — ABNORMAL HIGH (ref 65–99)
Glucose-Capillary: 80 mg/dL (ref 65–99)
Glucose-Capillary: 99 mg/dL (ref 65–99)

## 2014-08-08 LAB — PREPARE RBC (CROSSMATCH)

## 2014-08-08 MED ORDER — FUROSEMIDE 10 MG/ML IJ SOLN
20.0000 mg | Freq: Once | INTRAMUSCULAR | Status: AC
  Administered 2014-08-08: 20 mg via INTRAVENOUS
  Filled 2014-08-08: qty 2

## 2014-08-08 MED ORDER — SODIUM CHLORIDE 0.9 % IV SOLN
Freq: Once | INTRAVENOUS | Status: AC
  Administered 2014-08-08: 13:00:00 via INTRAVENOUS

## 2014-08-08 NOTE — Evaluation (Signed)
Physical Therapy Evaluation Patient Details Name: [name] MRN: [id number] DOB: March 25, 1930 Today's Date: 08/08/2014   History of Present Illness    81 year old female with PMH  which includes CHF, HTN, Anemia, and dementia. Readmitted 6/30 with SOB, emergently intubated, extubated 7/2.      Clinical Impression  Pt presents likely at/near baseline functional level.  Currently asking repeatedly for spouse, reluctant to perform many mobility skills, but able to demonstrate ambulation short distance in room.  Unclear what level of assistance pt required or spouse able to provide but if able and will to safely assist with basic mobility without concern for his own health limitation, expect pt can return home with him.  Otherwise, would anticipate need for SNF.  Largest question is how adherent pt is able to be with PT interventions, but could benefit from PT at d/c.  If adequate support for d/c and pt able to d/c home safely, consider HHPT for environmental assessment and need for further services.  Will defer acute mobility needs to nursing staff.  Thank you.    Follow Up Recommendations SNF;Supervision/Assistance - 24 hour    Equipment Recommendations  Rolling walker with 5" wheels    Recommendations for Other Services       Precautions / Restrictions Precautions Precautions: Fall Precaution Comments: up with assist, use RW for safety as able      Mobility  Bed Mobility Overal bed mobility:  (unobserved, in chair)                Transfers Overall transfer level: Needs assistance Equipment used: Rolling walker (2 wheeled) Transfers: Sit to/from Stand Sit to Stand: Min assist         General transfer comment: needs gentle encouragement to perform, is able to initiate and requires more standby assist for safety and motivation as physically able;  Ambulation/Gait Ambulation/Gait assistance: Min assist Ambulation Distance (Feet): 20 Feet Assistive device: Rolling  walker (2 wheeled) Gait Pattern/deviations: Step-through pattern;Decreased stride length;Shuffle   Gait velocity interpretation: at or above normal speed for age/gender (by observation) General Gait Details: much encouragement including goal (to bsc, back to chair), reluctant to walk further and asking repeatedly for husband.  gait details below but generally shuffles and more willing with RW  Stairs            Wheelchair Mobility    Modified Rankin (Stroke Patients Only)       Balance Overall balance assessment:  (unable to formally assess)                                           Pertinent Vitals/Pain Pain Assessment: No/denies pain    Home Living Family/patient expects to be discharged to:: Private residence Living Arrangements: Spouse/significant other Available Help at Discharge: Family;Available 24 hours/day (need to verify)             Additional Comments: per MD, pt lives with spouse who provides care.  Unclear to what extent pt needs assist at baseline and whether/what equipment she uses.    Prior Function Level of Independence: Needs assistance         Comments: unable to fully evaluate due to mental status     Hand Dominance        Extremity/Trunk Assessment   Upper Extremity Assessment: Overall WFL for tasks assessed  Lower Extremity Assessment: Overall WFL for tasks assessed      Cervical / Trunk Assessment: Normal  Communication   Communication: HOH (extremely)  Cognition Arousal/Alertness: Awake/alert Behavior During Therapy: Restless Overall Cognitive Status: History of cognitive impairments - at baseline (assume baseline with h/o dementia)       Memory: Decreased recall of precautions;Decreased short-term memory              General Comments      Exercises        Assessment/Plan    PT Assessment All further PT needs can be met in the next venue of care  PT Diagnosis Difficulty  walking   PT Problem List Cardiopulmonary status limiting activity;Decreased knowledge of use of DME;Decreased cognition;Decreased mobility;Decreased balance;Decreased safety awareness  PT Treatment Interventions     PT Goals (Current goals can be found in the Care Plan section) Acute Rehab PT Goals PT Goal Formulation: All assessment and education complete, DC therapy    Frequency     Barriers to discharge        Co-evaluation               End of Session Equipment Utilized During Treatment: Gait belt Activity Tolerance: Patient tolerated treatment well;Treatment limited secondary to agitation (mild agitation vs. baseline dementia) Patient left: in chair;with nursing/sitter in room Nurse Communication: Mobility status         Time: 0957-1010 PT Time Calculation (min) (ACUTE ONLY): 13 min   Charges:   PT Evaluation $Initial PT Evaluation Tier I: 1 Procedure     PT G Codes:        Herbie Drape 08/08/2014, 10:12 AM

## 2014-08-08 NOTE — Progress Notes (Signed)
Utilization Review completed. Tila Millirons RN BSN CM 

## 2014-08-08 NOTE — Progress Notes (Signed)
[facility] PROGRESS NOTE  [name] NIO:[id number] DOB: 1930-11-23 DOA: 08/05/2014 PCP:  Melinda Crutch, MD  Assessment/Plan: 1. Acute resp failure -s/p VDRF , extubated 7/2 -wean O2  2. CHF/Flash Pulm edema -ECHo with preserved EF and grade 2 DD -likely triggered by anemia as well -received 20mg  IV lasix this am, re-eval in am  3. Iron defi anemia -Husband reports h/o colonoscopy in last 5-10 years -transfuse 1 unit PRBC today  4. Dementia -with intermittent confusion at baseline and   5. Possible pneumonia -has been on IV rocephin, change to Po abx pending swallow eval  Code Status: Full Code Family Communication: called and updated spouse Disposition Plan: Home vs SNF  Consultants:  PCCM Tx  HPI/Subjective: No complaints,   Objective: Filed Vitals:   08/08/14 0627  BP: 157/57  Pulse: 88  Temp: 99 F (37.2 C)  Resp: 20    Intake/Output Summary (Last 24 hours) at 08/08/14 0930 Last data filed at 08/08/14 0156  Gross per 24 hour  Intake     50 ml  Output    465 ml  Net   -415 ml   Filed Weights   08/06/14 0353 08/07/14 0448 08/08/14 0551  Weight: 69.8 kg (153 lb 14.1 oz) 67.7 kg (149 lb 4 oz) 68.221 kg (150 lb 6.4 oz)    Exam:   General:  AAO to self, place, very hard of hearing  Cardiovascular: S1S2/RRR  Respiratory: crackles at bases  Abdomen: soft, NT, BS present  Musculoskeletal:no edema c/c   Data Reviewed: Basic Metabolic Panel:  Recent Labs Lab 08/05/14 0324 08/05/14 0330 08/05/14 0519 08/06/14 0226 08/07/14 0500 08/08/14 0540  NA 130* 133* 133* 131* 130* 134*  K 4.9 4.6 4.5 4.0 3.6 4.0  CL 98* 96* 94* 94* 95* 99*  CO2 24  --  29 27 26 23   GLUCOSE 227* 217* 119* 134* 98 80  BUN 18 22* 16 17 11 8   CREATININE 1.16* 1.10* 1.05* 1.21* 1.07* 0.90  CALCIUM 8.3*  --  8.6* 8.0* 8.1* 8.5*  MG  --   --  2.2 1.7 1.8  --   PHOS  --   --  5.7* 3.4 3.0  --    Liver Function Tests:  Recent Labs Lab 08/05/14 0324  08/05/14 0519  AST 45* 47*  ALT 21 20  ALKPHOS 88 93  BILITOT 0.4 0.6  PROT 7.2 7.3  ALBUMIN 3.5 3.5   No results for input(s): LIPASE, AMYLASE in the last 168 hours. No results for input(s): AMMONIA in the last 168 hours. CBC:  Recent Labs Lab 08/05/14 0404 08/05/14 1718 08/05/14 2250 08/06/14 0226 08/07/14 0500 08/08/14 0540  WBC 14.4* 12.6* 10.8* 11.3* 10.6* 8.8  NEUTROABS 11.2*  --   --   --   --   --   HGB 9.1* 8.0* 7.8* 8.0* 7.2* 7.5*  HCT 31.1* 26.7* 26.4* 26.7* 24.1* 25.6*  MCV 64.5* 63.7* 62.7* 63.4* 62.9* 64.5*  PLT 391 340 324 329 273 273   Cardiac Enzymes:  Recent Labs Lab 08/05/14 0519 08/05/14 1150 08/05/14 2035  TROPONINI 0.07* 0.08* 0.07*   BNP (last 3 results)  Recent Labs  08/05/14 0316 08/07/14 0500 08/07/14 1450  BNP 387.2* 326.9* 359.2*    ProBNP (last 3 results) No results for input(s): PROBNP in the last 8760 hours.  CBG:  Recent Labs Lab 08/07/14 1609 08/07/14 2021 08/08/14 0035 08/08/14 0416 08/08/14 0815  GLUCAP 88 91 104* 92 80    Recent  Results (from the past 240 hour(s))  Culture, blood (routine x 2)     Status: None (Preliminary result)   Collection Time: 08/05/14  5:11 AM  Result Value Ref Range Status   Specimen Description BLOOD FOREARM RIGHT  Final   Special Requests BOTTLES DRAWN AEROBIC ONLY 3CC  Final   Culture NO GROWTH 2 DAYS  Final   Report Status PENDING  Incomplete  Culture, blood (routine x 2)     Status: None (Preliminary result)   Collection Time: 08/05/14  5:18 AM  Result Value Ref Range Status   Specimen Description BLOOD FOREARM LEFT  Final   Special Requests BOTTLES DRAWN AEROBIC ONLY 3CC  Final   Culture NO GROWTH 2 DAYS  Final   Report Status PENDING  Incomplete  MRSA PCR Screening     Status: None   Collection Time: 08/05/14  6:42 AM  Result Value Ref Range Status   MRSA by PCR NEGATIVE NEGATIVE Final    Comment:        The GeneXpert MRSA Assay (FDA approved for NASAL  specimens only), is one component of a comprehensive MRSA colonization surveillance program. It is not intended to diagnose MRSA infection nor to guide or monitor treatment for MRSA infections.   Urine culture     Status: None   Collection Time: 08/05/14  7:16 AM  Result Value Ref Range Status   Specimen Description URINE, CATHETERIZED  Final   Special Requests NONE  Final   Culture NO GROWTH 1 DAY  Final   Report Status 08/06/2014 FINAL  Final  Culture, respiratory (tracheal aspirate)     Status: None   Collection Time: 08/05/14  7:16 AM  Result Value Ref Range Status   Specimen Description TRACHEAL ASPIRATE  Final   Special Requests NONE  Final   Gram Stain   Final    FEW WBC PRESENT,BOTH PMN AND MONONUCLEAR NO SQUAMOUS EPITHELIAL CELLS SEEN RARE GRAM POSITIVE COCCI IN PAIRS IN CHAINS Performed at [facility]    Culture   Final    NO GROWTH 2 DAYS Performed at [facility]    Report Status 08/07/2014 FINAL  Final     Studies: Dg Chest Port 1 View  08/07/2014   CLINICAL DATA:  Acute respiratory failure  EXAM: PORTABLE CHEST - 1 VIEW  COMPARISON:  the previous day's study  FINDINGS: Endotracheal tube, nasogastric tube, and left IJ central line are stable in position. Improved aeration bilaterally. Chronic blunting of left lateral costophrenic angle. Stable left hilar fullness. Postop or posttraumatic changes to several contiguous left ribs. Heart size upper limits normal for technique. No pneumothorax. Atheromatous aorta.  IMPRESSION: 1.  Support hardware stable in position. 2. Slightly improved aeration since previous exam.   Electronically Signed   By: Lucrezia Europe M.D.   On: 08/07/2014 08:34    Scheduled Meds: . antiseptic oral rinse  7 mL Mouth Rinse QID  . cefTRIAXone (ROCEPHIN)  IV  1 g Intravenous Q24H  . chlorhexidine  15 mL Mouth Rinse BID  . heparin  5,000 Units Subcutaneous 3 times per day  . insulin aspart  1-3 Units Subcutaneous 6 times per day   . pantoprazole (PROTONIX) IV  40 mg Intravenous QHS   Continuous Infusions: . sodium chloride 10 mL/hr at 08/06/14 2000   Antibiotics Given (last 72 hours)    Date/Time Action Medication Dose Rate   08/05/14 1214 Given   piperacillin-tazobactam (ZOSYN) IVPB 3.375 g 3.375 g 12.5 mL/hr  08/05/14 1400 Given   vancomycin (VANCOCIN) IVPB 1000 mg/200 mL premix 1,000 mg 200 mL/hr   08/05/14 2033 Given   piperacillin-tazobactam (ZOSYN) IVPB 3.375 g 3.375 g 12.5 mL/hr   08/06/14 0351 Given   piperacillin-tazobactam (ZOSYN) IVPB 3.375 g 3.375 g 12.5 mL/hr   08/06/14 1137 Given   piperacillin-tazobactam (ZOSYN) IVPB 3.375 g 3.375 g 12.5 mL/hr   08/06/14 1137 Given   vancomycin (VANCOCIN) IVPB 1000 mg/200 mL premix 1,000 mg 200 mL/hr   08/06/14 1957 Given   piperacillin-tazobactam (ZOSYN) IVPB 3.375 g 3.375 g 12.5 mL/hr   08/07/14 0439 Given   piperacillin-tazobactam (ZOSYN) IVPB 3.375 g 3.375 g 12.5 mL/hr   08/07/14 1102 Given   vancomycin (VANCOCIN) IVPB 1000 mg/200 mL premix 1,000 mg 200 mL/hr   08/07/14 1439 Given   cefTRIAXone (ROCEPHIN) 1 g in dextrose 5 % 50 mL IVPB - Premix 1 g 100 mL/hr      Active Problems:   Acute respiratory failure with hypoxemia   Respiratory failure   Shock   Acute diastolic CHF (congestive heart failure)   Acute respiratory failure   Acute respiratory failure with hypoxia    Time spent: 96min    Pierre Dellarocco  [facility] Pager [phone]. If 7PM-7AM, please contact night-coverage at [email or web address], password [facility] 08/08/2014, 9:30 AM  LOS: 3 days

## 2014-08-08 NOTE — Evaluation (Signed)
Clinical/Bedside Swallow Evaluation Patient Details  Name: [name] MRN: [id number] Date of Birth: 1930/10/13  Today's Date: 08/08/2014 Time: SLP Start Time (ACUTE ONLY): 79 SLP Stop Time (ACUTE ONLY): 1113 SLP Time Calculation (min) (ACUTE ONLY): 23 min  Past Medical History:  Past Medical History  Diagnosis Date  . Fractured hip 10/2010    fractured left hip, L THR   . On prednisone therapy 2008-2010    Continuous prednisone therapy   . PNA (pneumonia)     w/lung abscess at 106 months of age  . IBS (irritable bowel syndrome)     IBS-constipation  . Palpitations     paroxysmal atrial tachycardia, EF normal, diastolic dysfunction-echo/Holter 10/10 (she did not wish to take medications prescribed, metoprolol/HCTZ)  . Fibromyalgia     jt pains, Tramadol Miller 0-4 per day   . Dementia    Past Surgical History:  Past Surgical History  Procedure Laterality Date  . Lobectomy  1956    lung, partial   . Cholecystectomy  1998  . Nasal sinus surgery  1982  . Cyst excision      breast cyst removed   . Colonoscopy      2005 wnl per old records   HPI:  81 year old female with PMH which includes CHF, HTN, Anemia, and dementia. Readmitted 6/30 with SOB (? Pulmonary edema, ? PNA), emergently intubated, extubated 7/2.   Bedside swallow evaluation completed 6/24 during previous admission and noted functional oropharyngeal swallow with recommendations for a dysphagia 3 diet, thin liquid.    Assessment / Plan / Recommendation Clinical Impression  Bedside swallow evaluation complete. Patient presents with a functional appearing oropharyngeal swallow although intake very limited as patient severely distracted, perseverating on wanting to call spouse. No significant s/s of aspiration observed at bedside. Intermittent throat clearing noted both with and without pos. Patient complaining of inability to masticate soft solids due to missing dentures. Will initiate puree diet, thin liquid and f/u  at bedside to monitor for tolerance given recent intubation, hoarse vocal quality, and AMS, all of which may increase aspiration risk.     Aspiration Risk  Mild    Diet Recommendation Dysphagia 1 (Puree);Thin   Medication Administration: Whole meds with liquid Compensations: Slow rate;Small sips/bites    Other  Recommendations Oral Care Recommendations: Oral care BID   Follow Up Recommendations       Frequency and Duration min 1 x/week  1 week   Pertinent Vitals/Pain n/a      Swallow Study Prior Functional Status  Available Help at Discharge: Family;Available 24 hours/day (need to verify)    General Other Pertinent Information: 81 year old female with PMH which includes CHF, HTN, Anemia, and dementia. Readmitted 6/30 with SOB (? Pulmonary edema, ? PNA), emergently intubated, extubated 7/2.   Bedside swallow evaluation completed 6/24 during previous admission and noted functional oropharyngeal swallow with recommendations for a dysphagia 3 diet, thin liquid.  Type of Study: Bedside swallow evaluation Previous Swallow Assessment: see HPI Diet Prior to this Study: NPO Temperature Spikes Noted: No Respiratory Status: Supplemental O2 delivered via (comment) (nasal cannula) History of Recent Intubation: Yes Length of Intubations (days): 3 days Date extubated: 08/07/14 Behavior/Cognition: Alert;Confused;Distractible Oral Cavity - Dentition: Edentulous;Other (Comment) Self-Feeding Abilities: Able to feed self Patient Positioning: Upright in chair/Tumbleform Baseline Vocal Quality: Hoarse Volitional Cough: Strong Volitional Swallow: Able to elicit    Oral/Motor/Sensory Function Overall Oral Motor/Sensory Function: Appears within functional limits for tasks assessed   [employer]  chips: Not tested   Thin Liquid Thin Liquid: Within functional limits Presentation: Cup;Straw    Nectar Thick Nectar Thick Liquid: Not tested   Honey Thick Honey Thick Liquid: Not tested   Puree  Puree: Within functional limits (except intermittent throat clearing)   Solid   GO    Solid: Impaired (c/o difficulty masticating due to missing dentition) Presentation: Self Fed Oral Phase Impairments: Impaired mastication;Impaired anterior to posterior transit      Gabriel Rainwater MA, CCC-SLP [phone]  Malaiyah Achorn Meryl 08/08/2014,11:17 AM

## 2014-08-09 LAB — TYPE AND SCREEN
ABO/RH(D): A POS
ANTIBODY SCREEN: NEGATIVE
UNIT DIVISION: 0

## 2014-08-09 LAB — BASIC METABOLIC PANEL
Anion gap: 13 (ref 5–15)
BUN: 8 mg/dL (ref 6–20)
CALCIUM: 8.4 mg/dL — AB (ref 8.9–10.3)
CHLORIDE: 97 mmol/L — AB (ref 101–111)
CO2: 25 mmol/L (ref 22–32)
Creatinine, Ser: 0.85 mg/dL (ref 0.44–1.00)
GFR calc Af Amer: >60 mL/min (ref >60)
Glucose, Bld: 85 mg/dL (ref 65–99)
Potassium: 3.7 mmol/L (ref 3.5–5.1)
SODIUM: 135 mmol/L (ref 135–145)

## 2014-08-09 LAB — CBC
HCT: 30 % — ABNORMAL LOW (ref 36.0–46.0)
Hemoglobin: 9.1 g/dL — ABNORMAL LOW (ref 12.0–15.0)
MCH: 20 pg — AB (ref 26.0–34.0)
MCHC: 30.3 g/dL (ref 30.0–36.0)
MCV: 65.8 fL — ABNORMAL LOW (ref 78.0–100.0)
PLATELETS: 277 10*3/uL (ref 150–400)
RBC: 4.56 MIL/uL (ref 3.87–5.11)
RDW: 20.8 % — AB (ref 11.5–15.5)
WBC: 8 10*3/uL (ref 4.0–10.5)

## 2014-08-09 LAB — GLUCOSE, CAPILLARY
GLUCOSE-CAPILLARY: 110 mg/dL — AB (ref 65–99)
GLUCOSE-CAPILLARY: 161 mg/dL — AB (ref 65–99)
GLUCOSE-CAPILLARY: 100 mg/dL — AB (ref 65–99)
Glucose-Capillary: 86 mg/dL (ref 65–99)
Glucose-Capillary: 95 mg/dL (ref 65–99)
Glucose-Capillary: 99 mg/dL (ref 65–99)

## 2014-08-09 MED ORDER — LEVOFLOXACIN 500 MG PO TABS
500.0000 mg | ORAL_TABLET | Freq: Every day | ORAL | Status: DC
  Administered 2014-08-09: 500 mg via ORAL
  Filled 2014-08-09 (×2): qty 1

## 2014-08-09 MED ORDER — POTASSIUM CHLORIDE CRYS ER 20 MEQ PO TBCR
20.0000 meq | EXTENDED_RELEASE_TABLET | Freq: Every day | ORAL | Status: DC
  Administered 2014-08-09: 20 meq via ORAL
  Filled 2014-08-09 (×3): qty 1

## 2014-08-09 MED ORDER — FUROSEMIDE 20 MG PO TABS
20.0000 mg | ORAL_TABLET | Freq: Every day | ORAL | Status: DC
Start: 2014-08-09 — End: 2014-08-10
  Administered 2014-08-09: 20 mg via ORAL
  Filled 2014-08-09 (×2): qty 1

## 2014-08-09 MED ORDER — LORAZEPAM 0.5 MG PO TABS
0.2500 mg | ORAL_TABLET | Freq: Once | ORAL | Status: AC | PRN
  Administered 2014-08-09: 0.25 mg via ORAL
  Filled 2014-08-09: qty 1

## 2014-08-09 MED ORDER — CARVEDILOL 6.25 MG PO TABS
6.2500 mg | ORAL_TABLET | Freq: Two times a day (BID) | ORAL | Status: DC
  Administered 2014-08-09 – 2014-08-10 (×3): 6.25 mg via ORAL
  Filled 2014-08-09 (×5): qty 1

## 2014-08-09 NOTE — Care Management (Signed)
Important Message  Patient Details  Name: KADINCE BOXLEY MRN: [id number] Date of Birth: 12/08/1930   Medicare Important Message Given:  Yes-second notification given    Loann Quill 08/09/2014, 10:04 AM

## 2014-08-09 NOTE — Progress Notes (Addendum)
[facility] PROGRESS NOTE  [name] SUP:[id number] DOB: 23-Dec-1930 DOA: 08/05/2014 PCP:  Melinda Crutch, MD  Assessment/Plan: 1. Acute resp failure -s/p VDRF , extubated 7/2 -wean O2  2. CHF/Flash Pulm edema -ECHo with preserved EF and grade 2 DD -likely triggered by anemia as well -change lasix to PO 20mg   3. Iron defi anemia -Husband reports h/o colonoscopy in last 5-10 years -transfused 1 unit PRBC 7/3, no overt blood loss -monitor CBC, due to advanced age, dementia, defer to PCP whether she needs another colonoscopy when stable  4. Dementia -with intermittent confusion at baseline and significant memory deficits  5. Possible pneumonia -has been on IV rocephin, change to Po levaquin for 3 more days  6. Dysphagia -started D1 diet, SLP following  DVT proph: Hep SQ  DC sitter  Code Status: Full Code Family Communication: called and updated spouse 7/3 Disposition Plan: Home vs SNF  Consultants:  PCCM Tx  HPI/Subjective: No complaints,  wondering when her husband will come  Objective: Filed Vitals:   08/09/14 0448  BP: 173/76  Pulse: 82  Temp: 98.7 F (37.1 C)  Resp: 18    Intake/Output Summary (Last 24 hours) at 08/09/14 1111 Last data filed at 08/08/14 1552  Gross per 24 hour  Intake    688 ml  Output      0 ml  Net    688 ml   Filed Weights   08/07/14 0448 08/08/14 0551 08/09/14 0800  Weight: 67.7 kg (149 lb 4 oz) 68.221 kg (150 lb 6.4 oz) 64.048 kg (141 lb 3.2 oz)    Exam:   General:  AAO to self, place, very hard of hearing  Cardiovascular: S1S2/RRR  Respiratory: rare crackles at bases  Abdomen: soft, NT, BS present  Musculoskeletal:no edema c/c   Data Reviewed: Basic Metabolic Panel:  Recent Labs Lab 08/05/14 0519 08/06/14 0226 08/07/14 0500 08/08/14 0540 08/09/14 0527  NA 133* 131* 130* 134* 135  K 4.5 4.0 3.6 4.0 3.7  CL 94* 94* 95* 99* 97*  CO2 29 27 26 23 25   GLUCOSE 119* 134* 98 80 85  BUN 16 17 11 8 8    CREATININE 1.05* 1.21* 1.07* 0.90 0.85  CALCIUM 8.6* 8.0* 8.1* 8.5* 8.4*  MG 2.2 1.7 1.8  --   --   PHOS 5.7* 3.4 3.0  --   --    Liver Function Tests:  Recent Labs Lab 08/05/14 0324 08/05/14 0519  AST 45* 47*  ALT 21 20  ALKPHOS 88 93  BILITOT 0.4 0.6  PROT 7.2 7.3  ALBUMIN 3.5 3.5   No results for input(s): LIPASE, AMYLASE in the last 168 hours. No results for input(s): AMMONIA in the last 168 hours. CBC:  Recent Labs Lab 08/05/14 0404  08/05/14 2250 08/06/14 0226 08/07/14 0500 08/08/14 0540 08/09/14 0527  WBC 14.4*  < > 10.8* 11.3* 10.6* 8.8 8.0  NEUTROABS 11.2*  --   --   --   --   --   --   HGB 9.1*  < > 7.8* 8.0* 7.2* 7.5* 9.1*  HCT 31.1*  < > 26.4* 26.7* 24.1* 25.6* 30.0*  MCV 64.5*  < > 62.7* 63.4* 62.9* 64.5* 65.8*  PLT 391  < > 324 329 273 273 277  < > = values in this interval not displayed. Cardiac Enzymes:  Recent Labs Lab 08/05/14 0519 08/05/14 1150 08/05/14 2035  TROPONINI 0.07* 0.08* 0.07*   BNP (last 3 results)  Recent Labs  08/05/14 0316  08/07/14 0500 08/07/14 1450  BNP 387.2* 326.9* 359.2*    ProBNP (last 3 results) No results for input(s): PROBNP in the last 8760 hours.  CBG:  Recent Labs Lab 08/08/14 1607 08/08/14 2013 08/09/14 0014 08/09/14 0443 08/09/14 0742  GLUCAP 112* 99 99 95 86    Recent Results (from the past 240 hour(s))  Culture, blood (routine x 2)     Status: None (Preliminary result)   Collection Time: 08/05/14  5:11 AM  Result Value Ref Range Status   Specimen Description BLOOD FOREARM RIGHT  Final   Special Requests BOTTLES DRAWN AEROBIC ONLY 3CC  Final   Culture NO GROWTH 3 DAYS  Final   Report Status PENDING  Incomplete  Culture, blood (routine x 2)     Status: None (Preliminary result)   Collection Time: 08/05/14  5:18 AM  Result Value Ref Range Status   Specimen Description BLOOD FOREARM LEFT  Final   Special Requests BOTTLES DRAWN AEROBIC ONLY 3CC  Final   Culture NO GROWTH 3 DAYS  Final    Report Status PENDING  Incomplete  MRSA PCR Screening     Status: None   Collection Time: 08/05/14  6:42 AM  Result Value Ref Range Status   MRSA by PCR NEGATIVE NEGATIVE Final    Comment:        The GeneXpert MRSA Assay (FDA approved for NASAL specimens only), is one component of a comprehensive MRSA colonization surveillance program. It is not intended to diagnose MRSA infection nor to guide or monitor treatment for MRSA infections.   Urine culture     Status: None   Collection Time: 08/05/14  7:16 AM  Result Value Ref Range Status   Specimen Description URINE, CATHETERIZED  Final   Special Requests NONE  Final   Culture NO GROWTH 1 DAY  Final   Report Status 08/06/2014 FINAL  Final  Culture, respiratory (tracheal aspirate)     Status: None   Collection Time: 08/05/14  7:16 AM  Result Value Ref Range Status   Specimen Description TRACHEAL ASPIRATE  Final   Special Requests NONE  Final   Gram Stain   Final    FEW WBC PRESENT,BOTH PMN AND MONONUCLEAR NO SQUAMOUS EPITHELIAL CELLS SEEN RARE GRAM POSITIVE COCCI IN PAIRS IN CHAINS Performed at [facility]    Culture   Final    NO GROWTH 2 DAYS Performed at [facility]    Report Status 08/07/2014 FINAL  Final     Studies: No results found.  Scheduled Meds: . antiseptic oral rinse  7 mL Mouth Rinse QID  . carvedilol  6.25 mg Oral BID WC  . cefTRIAXone (ROCEPHIN)  IV  1 g Intravenous Q24H  . chlorhexidine  15 mL Mouth Rinse BID  . furosemide  20 mg Oral Daily  . heparin  5,000 Units Subcutaneous 3 times per day  . insulin aspart  1-3 Units Subcutaneous 6 times per day  . potassium chloride  20 mEq Oral Daily   Continuous Infusions: . sodium chloride 10 mL/hr at 08/06/14 2000   Antibiotics Given (last 72 hours)    Date/Time Action Medication Dose Rate   08/06/14 1137 Given   piperacillin-tazobactam (ZOSYN) IVPB 3.375 g 3.375 g 12.5 mL/hr   08/06/14 1137 Given   vancomycin (VANCOCIN) IVPB 1000  mg/200 mL premix 1,000 mg 200 mL/hr   08/06/14 1957 Given   piperacillin-tazobactam (ZOSYN) IVPB 3.375 g 3.375 g 12.5 mL/hr   08/07/14 0439 Given  piperacillin-tazobactam (ZOSYN) IVPB 3.375 g 3.375 g 12.5 mL/hr   08/07/14 1102 Given   vancomycin (VANCOCIN) IVPB 1000 mg/200 mL premix 1,000 mg 200 mL/hr   08/07/14 1439 Given   cefTRIAXone (ROCEPHIN) 1 g in dextrose 5 % 50 mL IVPB - Premix 1 g 100 mL/hr   08/08/14 1104 Given   cefTRIAXone (ROCEPHIN) 1 g in dextrose 5 % 50 mL IVPB - Premix 1 g 100 mL/hr      Active Problems:   Acute respiratory failure with hypoxemia   Respiratory failure   Shock   Acute diastolic CHF (congestive heart failure)   Acute respiratory failure   Acute respiratory failure with hypoxia    Time spent: 36min    Kiele Heavrin  [facility] Pager [phone]. If 7PM-7AM, please contact night-coverage at [email or web address], password [facility] 08/09/2014, 11:11 AM  LOS: 4 days

## 2014-08-09 NOTE — Progress Notes (Signed)
Speech Language Pathology Treatment: Dysphagia  Patient Details Name: [name] MRN: [id number] DOB: Jul 22, 1930 Today's Date: 08/09/2014 Time: 7199-4129 SLP Time Calculation (min) (ACUTE ONLY): 11 min  Assessment / Plan / Recommendation Clinical Impression  Much improved attention, phonation with excellent toleration of upgraded diet consistencies.  Pt with adequate attention to mastication; self feeds without difficulty; no s/s of aspiration.  Recommend advancing diet to mechanical soft with chopped meats; thin liquids.  No SLP f/u needed.  D/W pt's spouse - he asserts she is back to baseline.  SLP to sign off.    HPI Other Pertinent Information: 81 year old female with PMH which includes CHF, HTN, Anemia, and dementia. Readmitted 6/30 with SOB (? Pulmonary edema, ? PNA), emergently intubated, extubated 7/2.   Bedside swallow evaluation completed 6/24 during previous admission and noted functional oropharyngeal swallow with recommendations for a dysphagia 3 diet, thin liquid.    Pertinent Vitals Pain Assessment: No/denies pain  SLP Plan  All goals met    Recommendations Diet recommendations: Dysphagia 3 (mechanical soft);Thin liquid Liquids provided via: Cup;Straw Medication Administration: Whole meds with liquid Supervision: Patient able to self feed Compensations: Slow rate;Small sips/bites Postural Changes and/or Swallow Maneuvers: Seated upright 90 degrees              Oral Care Recommendations: Oral care BID Plan: All goals met   [name], [place] CCC/SLP Pager [phone]      [name] 08/09/2014, 4:23 PM

## 2014-08-09 NOTE — Clinical Social Work Note (Signed)
Clinical Social Work Assessment  Patient Details  Name: [name] MRN: [id number] Date of Birth: 1930-09-08  Date of referral:  08/09/14               Reason for consult:  Discharge Planning, Facility Placement                Permission sought to share information with:  Family Supports Permission granted to share information::  Yes, Verbal Permission Granted  Name::     [name]::     Relationship::     Contact Information:     Housing/Transportation Living arrangements for the past 2 months:  Single Family Home Source of Information:  Patient, Spouse Patient Interpreter Needed:  None Criminal Activity/Legal Involvement Pertinent to Current Situation/Hospitalization:  No - Comment as needed Significant Relationships:  Spouse, Adult Children Lives with:  Spouse Do you feel safe going back to the place where you live?  Yes Need for family participation in patient care:  Yes (Comment)  Care giving concerns:  Patient's husband does not list any concerns about the patient returning home. He states that he was already providing the current level of needed assistance at home and plans to continue this at discharge.   Social Worker assessment / plan:  CSW met with the patient and patient's husband at bedside to complete assessment. Patient is very hard of hearing. Patient is fine with CSW speaking with her husband [name]. [name] states that the patient was admitted from home where he lives takes care of her. Per [name] the patient has been in [facility] in the past after a broken hip (in 2012). He states that the patient had a horrible experience with SNF. He states he would rather take the patient home and continue providing the level of assistance he has already provided as he does not believe the patient will benefit much from placement. He believes the patient will do better in her own home. [name] states that the patient has received services from [facility] in the past. CSW will  notify RNCM of patient's plan being for home with [facility].   Employment status:  Disabled (Comment on whether or not currently receiving Disability) Insurance information:    PT Recommendations:    Information / Referral to community resources:  [place]  Patient/Family's Response to care:  Patient and patient's husband seem to be happy with the care the patient has received.  Patient/Family's Understanding of and Emotional Response to Diagnosis, Current Treatment, and Prognosis:  Patient appears to have limited insight into reason for admission. Husband has better understanding of admission and post DC needs. CSW signing off at this time.   Emotional Assessment Appearance:  Appears stated age Attitude/Demeanor/Rapport:  Other (Appropriate) Affect (typically observed):  Accepting, Calm, Appropriate, Pleasant Orientation:    Alcohol / Substance use:  Never Used Psych involvement (Current and /or in the community):  No (Comment)  Discharge Needs  Concerns to be addressed:  Discharge Planning Concerns Readmission within the last 30 days:  Yes Current discharge risk:  Cognitively Impaired, Physical Impairment Barriers to Discharge:  Continued Medical Work up   [employer] MSW, [place], [place], [phone]

## 2014-08-10 DIAGNOSIS — F0391 Unspecified dementia with behavioral disturbance: Secondary | ICD-10-CM

## 2014-08-10 LAB — BASIC METABOLIC PANEL
Anion gap: 9 (ref 5–15)
BUN: 16 mg/dL (ref 6–20)
CALCIUM: 8.6 mg/dL — AB (ref 8.9–10.3)
CO2: 27 mmol/L (ref 22–32)
Chloride: 99 mmol/L — ABNORMAL LOW (ref 101–111)
Creatinine, Ser: 0.9 mg/dL (ref 0.44–1.00)
GFR calc Af Amer: >60 mL/min (ref >60)
GFR calc non Af Amer: 58 mL/min — ABNORMAL LOW (ref >60)
GLUCOSE: 106 mg/dL — AB (ref 65–99)
Potassium: 3.8 mmol/L (ref 3.5–5.1)
Sodium: 135 mmol/L (ref 135–145)

## 2014-08-10 LAB — CULTURE, BLOOD (ROUTINE X 2)
CULTURE: NO GROWTH
Culture: NO GROWTH

## 2014-08-10 LAB — GLUCOSE, CAPILLARY
GLUCOSE-CAPILLARY: 124 mg/dL — AB (ref 65–99)
Glucose-Capillary: 143 mg/dL — ABNORMAL HIGH (ref 65–99)
Glucose-Capillary: 93 mg/dL (ref 65–99)

## 2014-08-10 LAB — CBC
HCT: 29.5 % — ABNORMAL LOW (ref 36.0–46.0)
Hemoglobin: 9.1 g/dL — ABNORMAL LOW (ref 12.0–15.0)
MCH: 20.5 pg — AB (ref 26.0–34.0)
MCHC: 30.8 g/dL (ref 30.0–36.0)
MCV: 66.4 fL — ABNORMAL LOW (ref 78.0–100.0)
Platelets: 266 10*3/uL (ref 150–400)
RBC: 4.44 MIL/uL (ref 3.87–5.11)
RDW: 21.4 % — ABNORMAL HIGH (ref 11.5–15.5)
WBC: 8.5 10*3/uL (ref 4.0–10.5)

## 2014-08-10 MED ORDER — ACETAMINOPHEN 325 MG PO TABS
650.0000 mg | ORAL_TABLET | Freq: Four times a day (QID) | ORAL | Status: DC | PRN
  Administered 2014-08-10: 650 mg via ORAL
  Filled 2014-08-10: qty 2

## 2014-08-10 MED ORDER — POTASSIUM CHLORIDE CRYS ER 20 MEQ PO TBCR: 20.0000 meq | EXTENDED_RELEASE_TABLET | Freq: Every day | ORAL | Status: DC

## 2014-08-10 MED ORDER — ACETAMINOPHEN 160 MG/5ML PO SOLN
650.0000 mg | Freq: Four times a day (QID) | ORAL | Status: DC | PRN
  Filled 2014-08-10: qty 20.3

## 2014-08-10 MED ORDER — FUROSEMIDE 20 MG PO TABS: 20.0000 mg | ORAL_TABLET | Freq: Every day | ORAL | Status: DC

## 2014-08-10 MED ORDER — LORAZEPAM 0.5 MG PO TABS
0.2500 mg | ORAL_TABLET | Freq: Once | ORAL | Status: AC | PRN
  Administered 2014-08-10: 0.25 mg via ORAL
  Filled 2014-08-10: qty 1

## 2014-08-10 MED ORDER — ACETAMINOPHEN 160 MG/5ML PO SOLN: 650.0000 mg | Freq: Four times a day (QID) | ORAL | Status: DC | PRN

## 2014-08-10 NOTE — Progress Notes (Signed)
[facility] notified that pt having leg pain.Tylenol 650mg  po ordered and administered. Pt did not sleep at all during the night after a total 0.5 mg Ativan po given.Will continue to monitor. Arthor Captain LPN

## 2014-08-10 NOTE — Progress Notes (Signed)
Spoke with patient and husband at bedside. They have received HH from [facility] in the past and would like to resume. Miranda at [facility] notified of referral for RN Aide PT. Husband denied any other home health or DME needs. Discharge today to home.

## 2014-08-10 NOTE — Progress Notes (Signed)
Pt is being discharged home with home health. Discgharge instructions were given to patient and family

## 2014-08-10 NOTE — Progress Notes (Signed)
[facility] notified that pt is very restless attempting to get up out of bed Ativan 0.25 mg was ordered and given to pt as ordered.Medication was not effective and [facility] notified again that medication was not effective 0.25 Ativan ordered and given will continue to monitor. Arthor Captain LPN

## 2014-08-10 NOTE — Discharge Summary (Signed)
Physician Discharge Summary  [name] EUM:[id number] DOB: 08-19-1930 DOA: 08/05/2014  PCP:  Melinda Crutch, MD  Admit date: 08/05/2014 Discharge date: 08/10/2014  Time spent:45 minutes  Recommendations for Outpatient Follow-up:  1. PCP in 1 week, please discuss the need for screening colonoscopy for Iron deficiency anemia  Discharge Diagnoses:  Active Problems:   Acute respiratory failure with hypoxemia   Respiratory failure   Dementia   Acute diastolic CHF (congestive heart failure)   Iron deficiency anemia   Dysphagia  Discharge Condition: stable  Diet recommendation: low sodium  Filed Weights   08/08/14 0551 08/09/14 0800 08/10/14 0700  Weight: 68.221 kg (150 lb 6.4 oz) 64.048 kg (141 lb 3.2 oz) 62.4 kg (137 lb 9.1 oz)    History of present illness:  Chief Complaint: Shortness of breath HPI: [name] is a 81 y.o. female with Past medical history of dementia, anemia. The patient presented with complaints of shortness of breath that started tonight.  The patient initially had some shortness of breath on Sunday and EMS was called she was found to be hypertensive and was requested to go to ER but she refused. 6/24 again she had shortness of breath and called EMS and was found to be significantly hypertensive. Her blood pressure was in 540G systolic. She was significantly hypoxic and tachypneic. She was placed on C Pap and then was brought here. The history was taken from patient's husband mentions there was no fever no chills no chest pain no nausea no vomiting no choking no diarrhea no constipation. No recent change in her medications. She did not have any issues with her blood pressure. For the last 2 weeks she has been having increasing shortness of breath on exertion and will get tired easily.  Hospital Course:  1. Acute resp failure -s/p VDRF , extubated 7/2 -improved, weaned O2  2. CHF/Flash Pulm edema -ECHo with preserved EF and grade 2 DD -likely triggered  by anemia as well -improved with diuresis, now transitioned to PO lasix 20mg   3. Iron defi anemia -Husband reports h/o colonoscopy in last 5-10 years -transfused 1 unit PRBC 7/3, no overt blood loss -due to advanced age, dementia, defer to PCP whether she needs another colonoscopy when stable  4. Dementia -with intermittent confusion at baseline and significant memory deficits  5. Possible pneumonia -was been on IV rocephin for 5days then Stopped, clinically do not suspect pnuemonia  6. Dysphagia -started D3 diet, underwent SLP evaluation   Discharge Exam: Filed Vitals:   08/10/14 0550  BP: 124/71  Pulse: 72  Temp: 98.9 F (37.2 C)  Resp: 16    General: AAOx3 Cardiovascular: S1S2/RRR Respiratory: CTAB  Discharge Instructions   Discharge Instructions    Diet - low sodium heart healthy    Complete by:  As directed   Dysphagia 3 diet     Increase activity slowly    Complete by:  As directed           Discharge Medication List as of 08/10/2014 11:10 AM    START taking these medications   Details  potassium chloride SA (K-DUR,KLOR-CON) 20 MEQ tablet Take 1 tablet (20 mEq total) by mouth daily., Starting 08/10/2014, Until Discontinued, Print      CONTINUE these medications which have CHANGED   Details  furosemide (LASIX) 20 MG tablet Take 1 tablet (20 mg total) by mouth daily., Starting 08/10/2014, Until Discontinued, Normal      CONTINUE these medications which have NOT CHANGED  Details  aspirin EC 81 MG EC tablet Take 1 tablet (81 mg total) by mouth daily., Starting 07/31/2014, Until Discontinued, Normal    clotrimazole-betamethasone (LOTRISONE) cream Apply 1 application topically 2 (two) times daily., Until Discontinued, Historical Med    econazole nitrate 1 % cream Apply 1 application topically 2 (two) times daily., Until Discontinued, Historical Med    escitalopram (LEXAPRO) 10 MG tablet Take 10 mg by mouth daily., Until Discontinued, Historical Med     hydrOXYzine (ATARAX/VISTARIL) 25 MG tablet Take 25 mg by mouth 3 (three) times daily as needed for anxiety. HydrOXYzine HCl 25 Tablet 1/2 to 1 tablet Three times a day Orally 30 days, Until Discontinued, Historical Med    lisinopril (PRINIVIL,ZESTRIL) 2.5 MG tablet Take 1 tablet (2.5 mg total) by mouth daily., Starting 08/01/2014, Until Discontinued, Normal    metoprolol tartrate (LOPRESSOR) 25 MG tablet Take 0.5 tablets (12.5 mg total) by mouth 2 (two) times daily., Starting 07/31/2014, Until Discontinued, Normal    PRESCRIPTION MEDICATION Apply 1 application topically daily. Steroid cream for rash on foot., Until Discontinued, Historical Med    traMADol (ULTRAM) 50 MG tablet Take 50-100 mg by mouth 3 (three) times daily as needed for moderate pain. , Until Discontinued, Historical Med       Allergies  Allergen Reactions  . Compazine [Prochlorperazine Edisylate]     could not talk or hold head up  . Demerol [Meperidine]     itch, nasal cong  . Dilaudid [Hydromorphone Hcl]     itch  . Novocain [Procaine]     heart races  . Other     "some antibiotic": muscle flexion Tetanus-Diphth-Acell Pertussis: left side numb   Follow-up Information    Follow up with  Melinda Crutch, MD. Schedule an appointment as soon as possible for a visit in 1 week.   Specialty:  Family Medicine   Contact information:   [place] [address] [phone]       Follow up with [place].   Why:  Fot RN PT aide. Will call in next 24-48 hours to set up first home appointment.   Contact information:   [address] [place] [address] [phone]        The results of significant diagnostics from this hospitalization (including imaging, microbiology, ancillary and laboratory) are listed below for reference.    Significant Diagnostic Studies: Dg Chest Port 1 View  08/07/2014   CLINICAL DATA:  Acute respiratory failure  EXAM: PORTABLE CHEST - 1 VIEW   COMPARISON:  the previous day's study  FINDINGS: Endotracheal tube, nasogastric tube, and left IJ central line are stable in position. Improved aeration bilaterally. Chronic blunting of left lateral costophrenic angle. Stable left hilar fullness. Postop or posttraumatic changes to several contiguous left ribs. Heart size upper limits normal for technique. No pneumothorax. Atheromatous aorta.  IMPRESSION: 1.  Support hardware stable in position. 2. Slightly improved aeration since previous exam.   Electronically Signed   By: Lucrezia Europe M.D.   On: 08/07/2014 08:34   Dg Chest Port 1 View  08/06/2014   CLINICAL DATA:  Respiratory failure, history of pneumonia  EXAM: PORTABLE CHEST - 1 VIEW  COMPARISON:  Portable chest x-ray of August 05, 2014  FINDINGS: The lungs are reasonably well inflated. The endotracheal tube tip lies at the origin of the right mainstem bronchus. The pulmonary interstitial markings remain increased. A small amount of pleural fluid on the left is present. The cardiac  silhouette is enlarged. The left internal jugular venous catheter tip projects over the proximal SVC. The tip of the esophagogastric tube projects below the inferior margin of the study.  IMPRESSION: Endotracheal tube tip at at the origin of the right mainstem bronchus. Withdrawal by 2-3 cm is recommended. The pulmonary interstitium has improved somewhat since yesterday's study consistent with resolving pneumonia.  These results were called by telephone at the time of interpretation on 08/06/2014 at 7:38 am to Alessandra Grout, RN, who verbally acknowledged these results.   Electronically Signed   By: David  Martinique M.D.   On: 08/06/2014 07:40   Dg Chest Port 1 View  08/05/2014   CLINICAL DATA:  Central line placement.  EXAM: PORTABLE CHEST - 1 VIEW  COMPARISON:  08/05/2014  FINDINGS: New left internal jugular central venous line. Tip lies in the lower superior vena cava. No pneumothorax.  Endotracheal tube tip now projects at [employer].  Recommend retracting 1-2 cm for more optimal positioning.  Nasogastric tube is well positioned with its tip in the distal stomach.  Central vascular congestion and mild bilateral interstitial thickening is similar to the prior study.  IMPRESSION: 1. Left IJ central venous line tip projects in the lower superior vena cava. No pneumothorax. 2. Endotracheal tube tip now projects at the [place]. Recommend retracting 1-2 cm. 3. No other change.   Electronically Signed   By: Lajean Manes M.D.   On: 08/05/2014 12:37   Dg Chest Portable 1 View  08/05/2014   CLINICAL DATA:  Intubation.  Respiratory failure.  EXAM: PORTABLE CHEST - 1 VIEW  COMPARISON:  07/30/2014  FINDINGS: The endotracheal tube tip is 1.9 cm above the carina. The nasogastric tube extends into the stomach.  There are airspace opacities distributed diffusely throughout the central and basilar regions of both lungs, worsened. No large effusion or pneumothorax is evident. There probably is a small left pleural effusion.  IMPRESSION: Support equipment appears satisfactorily positioned.  Central and basilar airspace opacities are mildly worsened. Probable small left pleural effusion.   Electronically Signed   By: Andreas Newport M.D.   On: 08/05/2014 03:41   Dg Chest Port 1 View  07/30/2014   CLINICAL DATA:  Acute onset of shortness of breath. Initial encounter.  EXAM: PORTABLE CHEST - 1 VIEW  COMPARISON:  Chest radiograph performed 10/21/2010  FINDINGS: The lungs are well-aerated. Vascular congestion is noted. Mild bibasilar opacities could reflect mild interstitial edema or pneumonia. A small left pleural effusion is suspected. No pneumothorax is seen.  The cardiomediastinal silhouette is enlarged. No acute osseous abnormalities are seen.  IMPRESSION: Vascular congestion and cardiomegaly. Mild bibasilar opacities could reflect mild interstitial edema or pneumonia. Suspect small left pleural effusion.   Electronically Signed   By: Garald Balding M.D.    On: 07/30/2014 03:01    Microbiology: Recent Results (from the past 240 hour(s))  Culture, blood (routine x 2)     Status: None (Preliminary result)   Collection Time: 08/05/14  5:11 AM  Result Value Ref Range Status   Specimen Description BLOOD FOREARM RIGHT  Final   Special Requests BOTTLES DRAWN AEROBIC ONLY 3CC  Final   Culture NO GROWTH 4 DAYS  Final   Report Status PENDING  Incomplete  Culture, blood (routine x 2)     Status: None (Preliminary result)   Collection Time: 08/05/14  5:18 AM  Result Value Ref Range Status   Specimen Description BLOOD FOREARM LEFT  Final   Special Requests BOTTLES DRAWN  AEROBIC ONLY 3CC  Final   Culture NO GROWTH 4 DAYS  Final   Report Status PENDING  Incomplete  MRSA PCR Screening     Status: None   Collection Time: 08/05/14  6:42 AM  Result Value Ref Range Status   MRSA by PCR NEGATIVE NEGATIVE Final    Comment:        The GeneXpert MRSA Assay (FDA approved for NASAL specimens only), is one component of a comprehensive MRSA colonization surveillance program. It is not intended to diagnose MRSA infection nor to guide or monitor treatment for MRSA infections.   Urine culture     Status: None   Collection Time: 08/05/14  7:16 AM  Result Value Ref Range Status   Specimen Description URINE, CATHETERIZED  Final   Special Requests NONE  Final   Culture NO GROWTH 1 DAY  Final   Report Status 08/06/2014 FINAL  Final  Culture, respiratory (tracheal aspirate)     Status: None   Collection Time: 08/05/14  7:16 AM  Result Value Ref Range Status   Specimen Description TRACHEAL ASPIRATE  Final   Special Requests NONE  Final   Gram Stain   Final    FEW WBC PRESENT,BOTH PMN AND MONONUCLEAR NO SQUAMOUS EPITHELIAL CELLS SEEN RARE GRAM POSITIVE COCCI IN PAIRS IN CHAINS Performed at [facility]    Culture   Final    NO GROWTH 2 DAYS Performed at [facility]    Report Status 08/07/2014 FINAL  Final     Labs: Basic Metabolic  Panel:  Recent Labs Lab 08/05/14 0519 08/06/14 0226 08/07/14 0500 08/08/14 0540 08/09/14 0527 08/10/14 0640  NA 133* 131* 130* 134* 135 135  K 4.5 4.0 3.6 4.0 3.7 3.8  CL 94* 94* 95* 99* 97* 99*  CO2 29 27 26 23 25 27   GLUCOSE 119* 134* 98 80 85 106*  BUN 16 17 11 8 8 16   CREATININE 1.05* 1.21* 1.07* 0.90 0.85 0.90  CALCIUM 8.6* 8.0* 8.1* 8.5* 8.4* 8.6*  MG 2.2 1.7 1.8  --   --   --   PHOS 5.7* 3.4 3.0  --   --   --    Liver Function Tests:  Recent Labs Lab 08/05/14 0324 08/05/14 0519  AST 45* 47*  ALT 21 20  ALKPHOS 88 93  BILITOT 0.4 0.6  PROT 7.2 7.3  ALBUMIN 3.5 3.5   No results for input(s): LIPASE, AMYLASE in the last 168 hours. No results for input(s): AMMONIA in the last 168 hours. CBC:  Recent Labs Lab 08/05/14 0404  08/06/14 0226 08/07/14 0500 08/08/14 0540 08/09/14 0527 08/10/14 0640  WBC 14.4*  < > 11.3* 10.6* 8.8 8.0 8.5  NEUTROABS 11.2*  --   --   --   --   --   --   HGB 9.1*  < > 8.0* 7.2* 7.5* 9.1* 9.1*  HCT 31.1*  < > 26.7* 24.1* 25.6* 30.0* 29.5*  MCV 64.5*  < > 63.4* 62.9* 64.5* 65.8* 66.4*  PLT 391  < > 329 273 273 277 266  < > = values in this interval not displayed. Cardiac Enzymes:  Recent Labs Lab 08/05/14 0519 08/05/14 1150 08/05/14 2035  TROPONINI 0.07* 0.08* 0.07*   BNP: BNP (last 3 results)  Recent Labs  08/05/14 0316 08/07/14 0500 08/07/14 1450  BNP 387.2* 326.9* 359.2*    ProBNP (last 3 results) No results for input(s): PROBNP in the last 8760 hours.  CBG:  Recent  Labs Lab 08/09/14 1704 08/09/14 2028 08/10/14 0017 08/10/14 0409 08/10/14 0744  GLUCAP 161* 100* 124* 143* 93       Signed:  Notnamed Scholz  [facility] 08/10/2014, 2:26 PM

## 2014-08-13 DIAGNOSIS — M797 Fibromyalgia: Secondary | ICD-10-CM | POA: Diagnosis not present

## 2014-08-13 DIAGNOSIS — R1310 Dysphagia, unspecified: Secondary | ICD-10-CM | POA: Diagnosis not present

## 2014-08-13 DIAGNOSIS — F0390 Unspecified dementia without behavioral disturbance: Secondary | ICD-10-CM | POA: Diagnosis not present

## 2014-08-13 DIAGNOSIS — I5031 Acute diastolic (congestive) heart failure: Secondary | ICD-10-CM | POA: Diagnosis not present

## 2014-08-13 DIAGNOSIS — I10 Essential (primary) hypertension: Secondary | ICD-10-CM | POA: Diagnosis not present

## 2014-08-13 DIAGNOSIS — D649 Anemia, unspecified: Secondary | ICD-10-CM | POA: Diagnosis not present

## 2014-08-16 DIAGNOSIS — I5031 Acute diastolic (congestive) heart failure: Secondary | ICD-10-CM | POA: Diagnosis not present

## 2014-08-16 DIAGNOSIS — R1310 Dysphagia, unspecified: Secondary | ICD-10-CM | POA: Diagnosis not present

## 2014-08-16 DIAGNOSIS — I10 Essential (primary) hypertension: Secondary | ICD-10-CM | POA: Diagnosis not present

## 2014-08-16 DIAGNOSIS — M797 Fibromyalgia: Secondary | ICD-10-CM | POA: Diagnosis not present

## 2014-08-16 DIAGNOSIS — D649 Anemia, unspecified: Secondary | ICD-10-CM | POA: Diagnosis not present

## 2014-08-16 DIAGNOSIS — F0390 Unspecified dementia without behavioral disturbance: Secondary | ICD-10-CM | POA: Diagnosis not present

## 2014-08-17 DIAGNOSIS — F0390 Unspecified dementia without behavioral disturbance: Secondary | ICD-10-CM | POA: Diagnosis not present

## 2014-08-17 DIAGNOSIS — M797 Fibromyalgia: Secondary | ICD-10-CM | POA: Diagnosis not present

## 2014-08-17 DIAGNOSIS — R1310 Dysphagia, unspecified: Secondary | ICD-10-CM | POA: Diagnosis not present

## 2014-08-17 DIAGNOSIS — D649 Anemia, unspecified: Secondary | ICD-10-CM | POA: Diagnosis not present

## 2014-08-17 DIAGNOSIS — I5031 Acute diastolic (congestive) heart failure: Secondary | ICD-10-CM | POA: Diagnosis not present

## 2014-08-17 DIAGNOSIS — I10 Essential (primary) hypertension: Secondary | ICD-10-CM | POA: Diagnosis not present

## 2014-08-18 DIAGNOSIS — I10 Essential (primary) hypertension: Secondary | ICD-10-CM | POA: Diagnosis not present

## 2014-08-18 DIAGNOSIS — F0390 Unspecified dementia without behavioral disturbance: Secondary | ICD-10-CM | POA: Diagnosis not present

## 2014-08-18 DIAGNOSIS — I5031 Acute diastolic (congestive) heart failure: Secondary | ICD-10-CM | POA: Diagnosis not present

## 2014-08-18 DIAGNOSIS — M797 Fibromyalgia: Secondary | ICD-10-CM | POA: Diagnosis not present

## 2014-08-18 DIAGNOSIS — R1310 Dysphagia, unspecified: Secondary | ICD-10-CM | POA: Diagnosis not present

## 2014-08-18 DIAGNOSIS — D649 Anemia, unspecified: Secondary | ICD-10-CM | POA: Diagnosis not present

## 2014-08-19 DIAGNOSIS — D649 Anemia, unspecified: Secondary | ICD-10-CM | POA: Diagnosis not present

## 2014-08-19 DIAGNOSIS — F0390 Unspecified dementia without behavioral disturbance: Secondary | ICD-10-CM | POA: Diagnosis not present

## 2014-08-19 DIAGNOSIS — M797 Fibromyalgia: Secondary | ICD-10-CM | POA: Diagnosis not present

## 2014-08-19 DIAGNOSIS — I5031 Acute diastolic (congestive) heart failure: Secondary | ICD-10-CM | POA: Diagnosis not present

## 2014-08-19 DIAGNOSIS — I10 Essential (primary) hypertension: Secondary | ICD-10-CM | POA: Diagnosis not present

## 2014-08-19 DIAGNOSIS — R1310 Dysphagia, unspecified: Secondary | ICD-10-CM | POA: Diagnosis not present

## 2014-08-20 DIAGNOSIS — F0390 Unspecified dementia without behavioral disturbance: Secondary | ICD-10-CM | POA: Diagnosis not present

## 2014-08-20 DIAGNOSIS — R1310 Dysphagia, unspecified: Secondary | ICD-10-CM | POA: Diagnosis not present

## 2014-08-20 DIAGNOSIS — I5031 Acute diastolic (congestive) heart failure: Secondary | ICD-10-CM | POA: Diagnosis not present

## 2014-08-20 DIAGNOSIS — M797 Fibromyalgia: Secondary | ICD-10-CM | POA: Diagnosis not present

## 2014-08-20 DIAGNOSIS — I10 Essential (primary) hypertension: Secondary | ICD-10-CM | POA: Diagnosis not present

## 2014-08-20 DIAGNOSIS — D649 Anemia, unspecified: Secondary | ICD-10-CM | POA: Diagnosis not present

## 2014-08-23 DIAGNOSIS — I5031 Acute diastolic (congestive) heart failure: Secondary | ICD-10-CM | POA: Diagnosis not present

## 2014-08-23 DIAGNOSIS — M797 Fibromyalgia: Secondary | ICD-10-CM | POA: Diagnosis not present

## 2014-08-23 DIAGNOSIS — F0390 Unspecified dementia without behavioral disturbance: Secondary | ICD-10-CM | POA: Diagnosis not present

## 2014-08-23 DIAGNOSIS — R1310 Dysphagia, unspecified: Secondary | ICD-10-CM | POA: Diagnosis not present

## 2014-08-23 DIAGNOSIS — I10 Essential (primary) hypertension: Secondary | ICD-10-CM | POA: Diagnosis not present

## 2014-08-23 DIAGNOSIS — D649 Anemia, unspecified: Secondary | ICD-10-CM | POA: Diagnosis not present

## 2014-08-24 DIAGNOSIS — I5031 Acute diastolic (congestive) heart failure: Secondary | ICD-10-CM | POA: Diagnosis not present

## 2014-08-24 DIAGNOSIS — D649 Anemia, unspecified: Secondary | ICD-10-CM | POA: Diagnosis not present

## 2014-08-24 DIAGNOSIS — R1310 Dysphagia, unspecified: Secondary | ICD-10-CM | POA: Diagnosis not present

## 2014-08-24 DIAGNOSIS — M797 Fibromyalgia: Secondary | ICD-10-CM | POA: Diagnosis not present

## 2014-08-24 DIAGNOSIS — F0390 Unspecified dementia without behavioral disturbance: Secondary | ICD-10-CM | POA: Diagnosis not present

## 2014-08-24 DIAGNOSIS — I10 Essential (primary) hypertension: Secondary | ICD-10-CM | POA: Diagnosis not present

## 2014-08-26 DIAGNOSIS — M797 Fibromyalgia: Secondary | ICD-10-CM | POA: Diagnosis not present

## 2014-08-26 DIAGNOSIS — D649 Anemia, unspecified: Secondary | ICD-10-CM | POA: Diagnosis not present

## 2014-08-26 DIAGNOSIS — F0390 Unspecified dementia without behavioral disturbance: Secondary | ICD-10-CM | POA: Diagnosis not present

## 2014-08-26 DIAGNOSIS — I5031 Acute diastolic (congestive) heart failure: Secondary | ICD-10-CM | POA: Diagnosis not present

## 2014-08-26 DIAGNOSIS — R1310 Dysphagia, unspecified: Secondary | ICD-10-CM | POA: Diagnosis not present

## 2014-08-26 DIAGNOSIS — I10 Essential (primary) hypertension: Secondary | ICD-10-CM | POA: Diagnosis not present

## 2014-08-27 DIAGNOSIS — I5031 Acute diastolic (congestive) heart failure: Secondary | ICD-10-CM | POA: Diagnosis not present

## 2014-08-27 DIAGNOSIS — M797 Fibromyalgia: Secondary | ICD-10-CM | POA: Diagnosis not present

## 2014-08-27 DIAGNOSIS — F0390 Unspecified dementia without behavioral disturbance: Secondary | ICD-10-CM | POA: Diagnosis not present

## 2014-08-27 DIAGNOSIS — I10 Essential (primary) hypertension: Secondary | ICD-10-CM | POA: Diagnosis not present

## 2014-08-27 DIAGNOSIS — D649 Anemia, unspecified: Secondary | ICD-10-CM | POA: Diagnosis not present

## 2014-08-27 DIAGNOSIS — R1310 Dysphagia, unspecified: Secondary | ICD-10-CM | POA: Diagnosis not present

## 2014-08-29 DIAGNOSIS — R1310 Dysphagia, unspecified: Secondary | ICD-10-CM | POA: Diagnosis not present

## 2014-08-29 DIAGNOSIS — D649 Anemia, unspecified: Secondary | ICD-10-CM | POA: Diagnosis not present

## 2014-08-29 DIAGNOSIS — I5031 Acute diastolic (congestive) heart failure: Secondary | ICD-10-CM | POA: Diagnosis not present

## 2014-08-29 DIAGNOSIS — I10 Essential (primary) hypertension: Secondary | ICD-10-CM | POA: Diagnosis not present

## 2014-08-29 DIAGNOSIS — M797 Fibromyalgia: Secondary | ICD-10-CM | POA: Diagnosis not present

## 2014-08-29 DIAGNOSIS — F0390 Unspecified dementia without behavioral disturbance: Secondary | ICD-10-CM | POA: Diagnosis not present

## 2014-08-30 DIAGNOSIS — I10 Essential (primary) hypertension: Secondary | ICD-10-CM | POA: Diagnosis not present

## 2014-08-30 DIAGNOSIS — F0390 Unspecified dementia without behavioral disturbance: Secondary | ICD-10-CM | POA: Diagnosis not present

## 2014-08-30 DIAGNOSIS — I5031 Acute diastolic (congestive) heart failure: Secondary | ICD-10-CM | POA: Diagnosis not present

## 2014-08-30 DIAGNOSIS — M797 Fibromyalgia: Secondary | ICD-10-CM | POA: Diagnosis not present

## 2014-08-30 DIAGNOSIS — R1310 Dysphagia, unspecified: Secondary | ICD-10-CM | POA: Diagnosis not present

## 2014-08-30 DIAGNOSIS — D649 Anemia, unspecified: Secondary | ICD-10-CM | POA: Diagnosis not present

## 2014-08-31 DIAGNOSIS — I5031 Acute diastolic (congestive) heart failure: Secondary | ICD-10-CM | POA: Diagnosis not present

## 2014-08-31 DIAGNOSIS — M797 Fibromyalgia: Secondary | ICD-10-CM | POA: Diagnosis not present

## 2014-08-31 DIAGNOSIS — R1310 Dysphagia, unspecified: Secondary | ICD-10-CM | POA: Diagnosis not present

## 2014-08-31 DIAGNOSIS — D649 Anemia, unspecified: Secondary | ICD-10-CM | POA: Diagnosis not present

## 2014-08-31 DIAGNOSIS — F0390 Unspecified dementia without behavioral disturbance: Secondary | ICD-10-CM | POA: Diagnosis not present

## 2014-08-31 DIAGNOSIS — I10 Essential (primary) hypertension: Secondary | ICD-10-CM | POA: Diagnosis not present

## 2014-09-03 DIAGNOSIS — F0390 Unspecified dementia without behavioral disturbance: Secondary | ICD-10-CM | POA: Diagnosis not present

## 2014-09-03 DIAGNOSIS — D649 Anemia, unspecified: Secondary | ICD-10-CM | POA: Diagnosis not present

## 2014-09-03 DIAGNOSIS — M797 Fibromyalgia: Secondary | ICD-10-CM | POA: Diagnosis not present

## 2014-09-03 DIAGNOSIS — I10 Essential (primary) hypertension: Secondary | ICD-10-CM | POA: Diagnosis not present

## 2014-09-03 DIAGNOSIS — R1310 Dysphagia, unspecified: Secondary | ICD-10-CM | POA: Diagnosis not present

## 2014-09-03 DIAGNOSIS — I5031 Acute diastolic (congestive) heart failure: Secondary | ICD-10-CM | POA: Diagnosis not present

## 2014-09-06 DIAGNOSIS — R1310 Dysphagia, unspecified: Secondary | ICD-10-CM | POA: Diagnosis not present

## 2014-09-06 DIAGNOSIS — I5031 Acute diastolic (congestive) heart failure: Secondary | ICD-10-CM | POA: Diagnosis not present

## 2014-09-06 DIAGNOSIS — D649 Anemia, unspecified: Secondary | ICD-10-CM | POA: Diagnosis not present

## 2014-09-06 DIAGNOSIS — F0390 Unspecified dementia without behavioral disturbance: Secondary | ICD-10-CM | POA: Diagnosis not present

## 2014-09-06 DIAGNOSIS — I10 Essential (primary) hypertension: Secondary | ICD-10-CM | POA: Diagnosis not present

## 2014-09-06 DIAGNOSIS — M797 Fibromyalgia: Secondary | ICD-10-CM | POA: Diagnosis not present

## 2014-09-08 DIAGNOSIS — I10 Essential (primary) hypertension: Secondary | ICD-10-CM | POA: Diagnosis not present

## 2014-09-08 DIAGNOSIS — R1310 Dysphagia, unspecified: Secondary | ICD-10-CM | POA: Diagnosis not present

## 2014-09-08 DIAGNOSIS — I5031 Acute diastolic (congestive) heart failure: Secondary | ICD-10-CM | POA: Diagnosis not present

## 2014-09-08 DIAGNOSIS — F0390 Unspecified dementia without behavioral disturbance: Secondary | ICD-10-CM | POA: Diagnosis not present

## 2014-09-08 DIAGNOSIS — M797 Fibromyalgia: Secondary | ICD-10-CM | POA: Diagnosis not present

## 2014-09-08 DIAGNOSIS — D649 Anemia, unspecified: Secondary | ICD-10-CM | POA: Diagnosis not present

## 2014-09-13 DIAGNOSIS — I5031 Acute diastolic (congestive) heart failure: Secondary | ICD-10-CM | POA: Diagnosis not present

## 2014-09-13 DIAGNOSIS — R1310 Dysphagia, unspecified: Secondary | ICD-10-CM | POA: Diagnosis not present

## 2014-09-13 DIAGNOSIS — D649 Anemia, unspecified: Secondary | ICD-10-CM | POA: Diagnosis not present

## 2014-09-13 DIAGNOSIS — I10 Essential (primary) hypertension: Secondary | ICD-10-CM | POA: Diagnosis not present

## 2014-09-13 DIAGNOSIS — F0390 Unspecified dementia without behavioral disturbance: Secondary | ICD-10-CM | POA: Diagnosis not present

## 2014-09-13 DIAGNOSIS — M797 Fibromyalgia: Secondary | ICD-10-CM | POA: Diagnosis not present

## 2014-09-15 DIAGNOSIS — M797 Fibromyalgia: Secondary | ICD-10-CM | POA: Diagnosis not present

## 2014-09-15 DIAGNOSIS — D649 Anemia, unspecified: Secondary | ICD-10-CM | POA: Diagnosis not present

## 2014-09-15 DIAGNOSIS — R1310 Dysphagia, unspecified: Secondary | ICD-10-CM | POA: Diagnosis not present

## 2014-09-15 DIAGNOSIS — I10 Essential (primary) hypertension: Secondary | ICD-10-CM | POA: Diagnosis not present

## 2014-09-15 DIAGNOSIS — F0390 Unspecified dementia without behavioral disturbance: Secondary | ICD-10-CM | POA: Diagnosis not present

## 2014-09-15 DIAGNOSIS — I5031 Acute diastolic (congestive) heart failure: Secondary | ICD-10-CM | POA: Diagnosis not present

## 2014-09-16 DIAGNOSIS — R1310 Dysphagia, unspecified: Secondary | ICD-10-CM | POA: Diagnosis not present

## 2014-09-16 DIAGNOSIS — M797 Fibromyalgia: Secondary | ICD-10-CM | POA: Diagnosis not present

## 2014-09-16 DIAGNOSIS — D649 Anemia, unspecified: Secondary | ICD-10-CM | POA: Diagnosis not present

## 2014-09-16 DIAGNOSIS — F0390 Unspecified dementia without behavioral disturbance: Secondary | ICD-10-CM | POA: Diagnosis not present

## 2014-09-16 DIAGNOSIS — I5031 Acute diastolic (congestive) heart failure: Secondary | ICD-10-CM | POA: Diagnosis not present

## 2014-09-16 DIAGNOSIS — I10 Essential (primary) hypertension: Secondary | ICD-10-CM | POA: Diagnosis not present

## 2014-09-27 DIAGNOSIS — R1310 Dysphagia, unspecified: Secondary | ICD-10-CM | POA: Diagnosis not present

## 2014-09-27 DIAGNOSIS — I5031 Acute diastolic (congestive) heart failure: Secondary | ICD-10-CM | POA: Diagnosis not present

## 2014-09-27 DIAGNOSIS — F0390 Unspecified dementia without behavioral disturbance: Secondary | ICD-10-CM | POA: Diagnosis not present

## 2014-09-27 DIAGNOSIS — I10 Essential (primary) hypertension: Secondary | ICD-10-CM | POA: Diagnosis not present

## 2014-09-27 DIAGNOSIS — M797 Fibromyalgia: Secondary | ICD-10-CM | POA: Diagnosis not present

## 2014-09-27 DIAGNOSIS — D649 Anemia, unspecified: Secondary | ICD-10-CM | POA: Diagnosis not present

## 2014-09-29 DIAGNOSIS — I5031 Acute diastolic (congestive) heart failure: Secondary | ICD-10-CM | POA: Diagnosis not present

## 2014-09-29 DIAGNOSIS — M797 Fibromyalgia: Secondary | ICD-10-CM | POA: Diagnosis not present

## 2014-09-29 DIAGNOSIS — I10 Essential (primary) hypertension: Secondary | ICD-10-CM | POA: Diagnosis not present

## 2014-09-29 DIAGNOSIS — R1310 Dysphagia, unspecified: Secondary | ICD-10-CM | POA: Diagnosis not present

## 2014-09-29 DIAGNOSIS — F0390 Unspecified dementia without behavioral disturbance: Secondary | ICD-10-CM | POA: Diagnosis not present

## 2014-09-29 DIAGNOSIS — D649 Anemia, unspecified: Secondary | ICD-10-CM | POA: Diagnosis not present

## 2014-10-04 DIAGNOSIS — R1310 Dysphagia, unspecified: Secondary | ICD-10-CM | POA: Diagnosis not present

## 2014-10-04 DIAGNOSIS — I5031 Acute diastolic (congestive) heart failure: Secondary | ICD-10-CM | POA: Diagnosis not present

## 2014-10-04 DIAGNOSIS — D649 Anemia, unspecified: Secondary | ICD-10-CM | POA: Diagnosis not present

## 2014-10-04 DIAGNOSIS — M797 Fibromyalgia: Secondary | ICD-10-CM | POA: Diagnosis not present

## 2014-10-04 DIAGNOSIS — F0390 Unspecified dementia without behavioral disturbance: Secondary | ICD-10-CM | POA: Diagnosis not present

## 2014-10-04 DIAGNOSIS — I10 Essential (primary) hypertension: Secondary | ICD-10-CM | POA: Diagnosis not present

## 2014-10-05 DIAGNOSIS — D649 Anemia, unspecified: Secondary | ICD-10-CM | POA: Diagnosis not present

## 2014-10-05 DIAGNOSIS — I509 Heart failure, unspecified: Secondary | ICD-10-CM | POA: Diagnosis not present

## 2014-10-07 DIAGNOSIS — R1310 Dysphagia, unspecified: Secondary | ICD-10-CM | POA: Diagnosis not present

## 2014-10-07 DIAGNOSIS — I5031 Acute diastolic (congestive) heart failure: Secondary | ICD-10-CM | POA: Diagnosis not present

## 2014-10-07 DIAGNOSIS — I10 Essential (primary) hypertension: Secondary | ICD-10-CM | POA: Diagnosis not present

## 2014-10-07 DIAGNOSIS — D649 Anemia, unspecified: Secondary | ICD-10-CM | POA: Diagnosis not present

## 2014-10-07 DIAGNOSIS — F0390 Unspecified dementia without behavioral disturbance: Secondary | ICD-10-CM | POA: Diagnosis not present

## 2014-10-07 DIAGNOSIS — M797 Fibromyalgia: Secondary | ICD-10-CM | POA: Diagnosis not present

## 2014-10-11 DIAGNOSIS — R1310 Dysphagia, unspecified: Secondary | ICD-10-CM | POA: Diagnosis not present

## 2014-10-11 DIAGNOSIS — M797 Fibromyalgia: Secondary | ICD-10-CM | POA: Diagnosis not present

## 2014-10-11 DIAGNOSIS — I10 Essential (primary) hypertension: Secondary | ICD-10-CM | POA: Diagnosis not present

## 2014-10-11 DIAGNOSIS — F0390 Unspecified dementia without behavioral disturbance: Secondary | ICD-10-CM | POA: Diagnosis not present

## 2014-10-11 DIAGNOSIS — D649 Anemia, unspecified: Secondary | ICD-10-CM | POA: Diagnosis not present

## 2014-10-11 DIAGNOSIS — I5031 Acute diastolic (congestive) heart failure: Secondary | ICD-10-CM | POA: Diagnosis not present

## 2014-11-17 ENCOUNTER — Other Ambulatory Visit: Payer: Self-pay

## 2014-11-17 NOTE — Patient Outreach (Signed)
[facility]) Care Management  11/17/2014  [name] August 20, 1930 [id number]  Assessment: Referral from heart failure readmission list. [name] was admitted 6/24-6/25 for heart failure and readmitted 6/30 -7/5 with respiratory failure. RNCM called to discussed Care Management services. [name] answered the phone initially, but gave the phone to [name] to speak with RNCM. RNCM began to discuss [place] Management services and [name] stopped [facility] mid-sentence stating, "Let me stop you there, we have advanced home right now". RNCM attempted to explain the difference between care management services and home health. [name] stated, "I don't care, that is the only one we are going with right now" and hung up the phone.  Plan: close case. Send letter to primary care physician.  [name], RN, MSN, [place] Coordinator Cell: [phone]

## 2014-11-17 NOTE — Patient Outreach (Signed)
[facility]) Care Management  11/17/2014  [name] Aug 25, 1930 [id number]   Referral from June 2016 Heart Failure readmission report, assigned [name], RN to outreach for [place] Management services.  Thanks, Ronnell Freshwater. [place], [place] Assistant Phone: [phone] Fax: [phone]

## 2014-11-22 NOTE — Patient Outreach (Signed)
[facility]) Care Management  11/22/2014  [name] Jan 20, 1931 [id number]   Notification from [name], RN to close case due to patient refused [place] Management services.  Thanks, Ronnell Freshwater. [place], [place] Assistant Phone: [phone] Fax: [phone]

## 2015-03-16 ENCOUNTER — Other Ambulatory Visit: Payer: Self-pay | Admitting: *Deleted

## 2015-03-16 NOTE — Patient Outreach (Signed)
Called pt for High Risk Screening. No one answered and I left a message to return my call.  Deloria Lair [facility] [phone]

## 2015-03-25 ENCOUNTER — Encounter: Payer: Self-pay | Admitting: *Deleted

## 2015-03-25 ENCOUNTER — Other Ambulatory Visit: Payer: Self-pay | Admitting: *Deleted

## 2015-03-25 NOTE — Patient Outreach (Signed)
Pt husband returned my call and he requested more information in writing before consenting to [facility] care management services. I will send him a letter with our brochure.  Deloria Lair [facility] [place] [phone]

## 2015-04-07 ENCOUNTER — Ambulatory Visit: Payer: Self-pay | Admitting: *Deleted

## 2015-04-11 ENCOUNTER — Other Ambulatory Visit: Payer: Self-pay | Admitting: *Deleted

## 2015-04-18 ENCOUNTER — Encounter: Payer: Self-pay | Admitting: *Deleted

## 2015-04-18 ENCOUNTER — Other Ambulatory Visit: Payer: Self-pay | Admitting: *Deleted

## 2015-04-18 ENCOUNTER — Ambulatory Visit: Payer: Self-pay | Admitting: *Deleted

## 2015-04-18 NOTE — Patient Outreach (Signed)
S:  HIGH COST PATIENT. Initial home visit. Pt has memory impairment but was able to participate. She is also very HOH.       She denies any problems. Husband reports she has had falls with some injury. He has installed a side rail on her side        of the bed and this has prevented any further falls out of bed. Pt is sometimes resistant to have a bath or hygienic        Care. He has not been weighing her daily any more. He only gives Lexapro prn. He leaves pt alone sometimes for        short intervals but she does get anxious when he is gone. They communicate by cell phone when he is gone.  O:  BP 120/60 mmHg  Pulse 88  Ht 1.575 m (5' 2")  Wt 130 lb (58.968 kg)  BMI 23.77 kg/m2  SpO2 98%       Alert and oriented to person, place but not time. She says she cooks, bathes herself etc and husband denies this.       RRR with occasional skipped beat       Lungs are clear.       Skin is very dry. Rough dermatitis on R lateral side of foot and plantar surface.       Toenails are long and onychomycotic  Current Outpatient Prescriptions on File Prior to Visit  Medication Sig Dispense Refill  . aspirin EC 81 MG EC tablet Take 1 tablet (81 mg total) by mouth daily. 30 tablet 0  . clotrimazole-betamethasone (LOTRISONE) cream Apply 1 application topically 2 (two) times daily.    Marland Kitchen escitalopram (LEXAPRO) 10 MG tablet Take 10 mg by mouth daily.    . furosemide (LASIX) 20 MG tablet Take 1 tablet (20 mg total) by mouth daily. 30 tablet 0  . hydrOXYzine (ATARAX/VISTARIL) 25 MG tablet Take 25 mg by mouth 3 (three) times daily as needed for anxiety. HydrOXYzine HCl 25 Tablet 1/2 to 1 tablet Three times a day Orally 30 days    . lisinopril (PRINIVIL,ZESTRIL) 2.5 MG tablet Take 1 tablet (2.5 mg total) by mouth daily. 30 tablet 0  . metoprolol tartrate (LOPRESSOR) 25 MG tablet Take 0.5 tablets (12.5 mg total) by mouth 2 (two) times daily. 60 tablet 0  . econazole nitrate 1 % cream Apply 1 application topically 2  (two) times daily. Reported on 04/18/2015    . potassium chloride SA (K-DUR,KLOR-CON) 20 MEQ tablet Take 1 tablet (20 mEq total) by mouth daily. 30 tablet 0   No current facility-administered medications on file prior to visit.    Depression screen PHQ 2/9 04/18/2015  Decreased Interest 1  Down, Depressed, Hopeless 1  PHQ - 2 Score 2  Altered sleeping 0  Tired, decreased energy 1  Change in appetite 0  Feeling bad or failure about yourself  0  Trouble concentrating 3  Moving slowly or fidgety/restless 0  Suicidal thoughts 0  PHQ-9 Score 6  Difficult doing work/chores Somewhat difficult    Fall Risk  04/18/2015  Falls in the past year? Yes  Number falls in past yr: 2 or more  Injury with Fall? Yes  Risk Factor Category  High Fall Risk  Risk for fall due to : History of fall(s);Impaired balance/gait  Follow up Falls evaluation completed;Education provided;Falls prevention discussed   A:   CHF       Mild dementia  Medication compliance issues       Caregiver burnout       Onychomycotic nails  P:  Debrided toenails and long, onychomycotic R 4th finger nail.       Provided CHF pt education and went through this with husband.       Completed MOST form - does not want to have CPR or life saving interventions but would like to go to hospital.       Advised husbvand to give lexapro daily. May give with applesauce or yogurt.       Will provide name of caregiver to assist husband once a week to give good bath and let husband have a break.       I will come back in 2 weeks.  [facility] CM Care Plan Problem One        Most Recent Value   Care Plan Problem One  CHF   Role Documenting the Problem One  Care Management Coordinator   Care Plan for Problem One  Active   THN Long Term Goal (31-90 days)  Pt will not be hospitalized in the next 90 days.   THN Long Term Goal Start Date  04/18/15   Interventions for Problem One Long Term Goal  Emphasized calling for medical advice when a problem  is identified, provided CHF action plan & Know before you go sheet.   THN CM Short Term Goal #1 (0-30 days)  Husband will resume assisting wife to weigh daily and recording the weight over the next 30 days.   THN CM Short Term Goal #1 Start Date  04/18/15   Interventions for Short Term Goal #1  Provided chf educattion packet and went over the material with huaband.    THN CM Care Plan Problem Two        Most Recent Value   Care Plan Problem Two  Pt is resisting bathing and hygienic care from husband.   Role Documenting the Problem Two  Care Management Coordinator   Care Plan for Problem Two  Active   THN CM Short Term Goal #1 (0-30 days)  Husband will hire aid to come once a week to assist with personal care and give him some respite.   THN CM Short Term Goal #1 Start Date  04/18/15   Interventions for Short Term Goal #2   Provided names of caregivers that may be able to assist.    THN CM Care Plan Problem Three        Most Recent Value   Care Plan Problem Three  No Advanced Directives.   Role Documenting the Problem Three  Care Management Coordinator   Care Plan for Problem Three  Active   THN CM Short Term Goal #1 (0-30 days)  Complete MOST form.   THN CM Short Term Goal #1 Start Date  04/18/15   THN CM Short Term Goal #1 Met Date  04/18/15   Interventions for Short Term Goal #1  Educated on importance of have a MOST form in the home so pt wishes can be followed. Pt doesn't want CPR or life support.         GNP-BC [facility] Care Manager [phone]  

## 2015-05-03 ENCOUNTER — Other Ambulatory Visit: Payer: Self-pay | Admitting: *Deleted

## 2015-05-03 NOTE — Patient Outreach (Signed)
[facility]) Care Management  05/03/2015  [name] 1931/01/01 [id number]   S:  Routine home visit. [name] says that his wife is walking better. She uses her walker on occasion. He says she sleeps a lot and her appetite has fallen off. She has had a wt loss of about 15 pounds.  O:  BP 140/70 mmHg  Pulse 70  Resp 18  Wt 114 lb (51.71 kg)  SpO2 97%       Irregular heart rhythm       Lungs are clear.       Pt clearing her throat constantly and sighing with each exhalation. Blows her nose frequently.       No edema  A:   CHF - stable       Alzhiemer's       Wt Loss  P:  Discussed diet, encouraged more protein, whole milk with some chocolate for snack or bedtime.        Will pursue caregiver to assist.       Discussed my concerns about pt safety, one day he will not be able to leave her alone.        Encouraged him to think of alternative for her care in the future.        I will return on 4/25 at 1:00 pm.  Deloria Lair [facility] [place] [phone]

## 2015-05-17 ENCOUNTER — Other Ambulatory Visit: Payer: Self-pay | Admitting: *Deleted

## 2015-05-17 ENCOUNTER — Encounter: Payer: Self-pay | Admitting: *Deleted

## 2015-05-17 DIAGNOSIS — R410 Disorientation, unspecified: Secondary | ICD-10-CM | POA: Diagnosis not present

## 2015-05-17 DIAGNOSIS — D649 Anemia, unspecified: Secondary | ICD-10-CM | POA: Diagnosis not present

## 2015-05-17 DIAGNOSIS — I509 Heart failure, unspecified: Secondary | ICD-10-CM | POA: Diagnosis not present

## 2015-05-17 NOTE — Patient Outreach (Signed)
Acute Home Visit. Pt's husband called me yesterday and reported that Mrs. Gorum had a rough weekend. He says she said she felt bad but couldn't says how. He also reports she is getting increasingly uneasy and fearful when he leaves her in the home alone.   She denies any pain, nausea, dizziness. She says she is weak and tired all the time.   O:  BP 120/64 mmHg  Pulse 78  Resp 16  Wt 114 lb (51.71 kg)  SpO2 98%       Pt is awake and alert but only oriented to person and that she is in her home but doesn't know address.       RRR       Lungs are clear       Abdomen is soft with bowel sounds present       No edema.  A:  Vague compliants of fatigue (hx of anemia)       CHF - stable       Dementia  P:  Drew labs:  BMET, CBC and collected a UA.       Encouraged continued medical regimen.       Advised Mr. Brouhard again that he may not be able to leave his wife at home at all very soon and he needs to             consider the alternatives we have previously discussed. I believe the only realistic alternative is for placement in             a memory care unit for her safety.        I will see pt later in the month as previously planned.  [facility] CM Care Plan Problem One        Most Recent Value   Care Plan Problem One  CHF   Role Documenting the Problem One  Care Management Coordinator   Care Plan for Problem One  Active   [facility] Long Term Goal (31-90 days)  Pt will not be hospitalized in the next 90 days.   [facility] Long Term Goal Start Date  04/18/15   Interventions for Problem One Long Term Goal  Emphasized calling for medical advice when a problem is identified, provided CHF action plan & Know before you go sheet.   [facility] CM Short Term Goal #1 (0-30 days)  Husband will resume assisting wife to weigh daily and recording the weight over the next 30 days.   [facility] CM Short Term Goal #1 Start Date  04/18/15   [facility] CM Short Term Goal #1 Met Date  05/17/15   Interventions for Short Term Goal #1  Reinforced  that this is an everyday need for the rest of her life.    [facility] CM Care Plan Problem Two        Most Recent Value   Care Plan Problem Two  Pt is resisting bathing and hygienic care from husband.   Role Documenting the Problem Two  Care Management Coordinator   Care Plan for Problem Two  Active   [facility] CM Short Term Goal #1 (0-30 days)  Husband will hire aid to come once a week to assist with personal care and give him some respite.   [facility] CM Short Term Goal #1 Start Date  04/18/15   Interventions for Short Term Goal #2   Provided names of caregivers that may be able to assist.    [facility] CM Care Plan Problem Three  Most Recent Value   Care Plan Problem Three  No Advanced Directives.   Role Documenting the Problem Three  Care Management Coordinator   Care Plan for Problem Three  Not Active   [facility] CM Short Term Goal #1 (0-30 days)  Complete MOST form.   [facility] CM Short Term Goal #1 Start Date  04/18/15   [facility] CM Short Term Goal #1 Met Date  04/18/15   Interventions for Short Term Goal #1  Educated on importance of have a MOST form in the home so pt wishes can be followed. Pt doesn't want CPR or life support.       Deloria Lair [facility] [place] [phone]

## 2015-05-31 ENCOUNTER — Other Ambulatory Visit: Payer: Self-pay | Admitting: *Deleted

## 2015-05-31 NOTE — Patient Outreach (Signed)
[facility] ([facility]) Care Management  05/31/2015  Tylyn H Baudoin 05/24/1930 [id number]   S:  Routine home visit. Pt has had no worsening of her condition, no further signs of a possible infection that could have affected her mentation. Mr. Overdorf reports she has some occasional SOB but it goes away after a few minutes. Her weight is stable.  O:  BP 100/50 mmHg  Pulse 55  Resp 16  SpO2 98%  wt 114.5       AFIB       RRR       No edema.  A: Dementia      AFIB  P: Provided names and numbers of memory care units in the area. Encouraged Mr. Lippert to go visit and after he narrows the choices down take Mrs. Standre to eat at the locations to see if she has a preference or not.  I also asked him to look into the [facility] program which is close to where they live. This would be a first step to see if this program could extend the time she is able to stay in her home.  I have told him that the only way he can keep Mrs. Thoennes at home long term will be to have an in home care giver and he WILL NOT BE ABLE TO LEAVE HER ALONE much longer. I told him that I am not comfortable with that at this time. I think this is a safety risk.  I will see them again in another two weeks.  [facility] CM Care Plan Problem One        Most Recent Value   Care Plan Problem One  CHF   Role Documenting the Problem One  Care Management Coordinator   Care Plan for Problem One  Active   [facility] Long Term Goal (31-90 days)  Pt will not be hospitalized in the next 90 days.   [facility] Long Term Goal Start Date  04/18/15   Interventions for Problem One Long Term Goal  Praised for calling with probelms.   [facility] CM Short Term Goal #1 (0-30 days)  Husband will resume assisting wife to weigh daily and recording the weight over the next 30 days.   [facility] CM Short Term Goal #1 Start Date  04/18/15   [facility] CM Short Term Goal #1 Met Date  05/17/15   Interventions for Short Term Goal #1  Reinforced that this is an everyday need for the rest of  her life.    [facility] CM Care Plan Problem Two        Most Recent Value   Care Plan Problem Two  Pt is resisting bathing and hygienic care from husband.   Role Documenting the Problem Two  Care Management Coordinator   Care Plan for Problem Two  Active   [facility] CM Short Term Goal #1 (0-30 days)  Husband will hire aid to come once a week to assist with personal care and give him some respite.   [facility] CM Short Term Goal #1 Start Date  04/18/15   Interventions for Short Term Goal #2   Unable to find private aids. Have referred to the [facility] program today. Husband to go look at facility and talk with them about finances.    [facility] CM Care Plan Problem Three        Most Recent Value   Care Plan Problem Three  No Advanced Directives.   Role Documenting the Problem Three  Care Management Coordinator     Care Plan for Problem Three  Not Active   [facility] CM Short Term Goal #1 (0-30 days)  Complete MOST form.   [facility] CM Short Term Goal #1 Start Date  04/18/15   [facility] CM Short Term Goal #1 Met Date  04/18/15   Interventions for Short Term Goal #1  Educated on importance of have a MOST form in the home so pt wishes can be followed. Pt doesn't want CPR or life support.     Deloria Lair [facility] [place] [phone]

## 2015-06-14 ENCOUNTER — Other Ambulatory Visit: Payer: Self-pay | Admitting: *Deleted

## 2015-06-14 NOTE — Patient Outreach (Signed)
[facility]) Care Management  06/14/2015  AMIL MOSEMAN 1930/06/18 [id number]   Routine home visit.  S:  Mr. [name] has gone over to the [facility] facility and picked up a brochure that describes their services. He has not applied for Mrs. Brocks admission to the program yet.  He denies Mrs. Delph has had any problems over the last 2 weeks. He is making sure she weighs at least a couple of times each week and her weight is stable at 114-115. He ensures that she has plenty to eat and sees that she gets her meds.  She got to go to the beauty parlor for a shampoo and a hair cut and she did well on this outing.  O:  BP 104/50 mmHg  Pulse 54  Resp 18  Wt 114 lb (51.71 kg)  SpO2 98%       RRR       Lungs are clear       No edema  A:  CHF stable      Alzheimer's  P:  I am ready to close this case, however, I would like Mr. Pinkstaff to get Mrs. Cheramie hooked up with this service before I officially close.  I have asked him to do so and call me to let me know what arrangements he has made. I will feel a lot better about him leaving the house with Mrs. Picker either at the [facility]ACE day program or with an in home caregiver. I will see them one more time in June.  [facility] CM Care Plan Problem One        Most Recent Value   Care Plan Problem One  CHF   Role Documenting the Problem One  Care Management Coordinator   Care Plan for Problem One  Active   [facility] Long Term Goal (31-90 days)  Pt will not be hospitalized in the next 90 days.   [facility] Long Term Goal Start Date  04/18/15   Interventions for Problem One Long Term Goal  Reinforced to call me if problems arise.   [facility] CM Short Term Goal #1 (0-30 days)  Husband will resume assisting wife to weigh daily and recording the weight over the next 30 days.   [facility] CM Short Term Goal #1 Start Date  04/18/15   [facility] CM Short Term Goal #1 Met Date  05/17/15   Interventions for Short Term Goal #1  Praised for this ongoing life long task.    [facility] CM  Care Plan Problem Two        Most Recent Value   Care Plan Problem Two  Pt is resisting bathing and hygienic care from husband.   Role Documenting the Problem Two  Care Management Coordinator   Care Plan for Problem Two  Active   [facility] CM Short Term Goal #1 (0-30 days)  Husband will hire aid to come once a week to assist with personal care and give him some respite.   [facility] CM Short Term Goal #1 Start Date  04/18/15   Interventions for Short Term Goal #2   Unable to find private aids. Have referred to the [facility] program today. Husband to go look at facility and talk with them about finances.   [facility] CM Short Term Goal #2 (0-30 days)  Pt to apply for [facility]ACE program with in the next 30 days.   [facility] CM Short Term Goal #2 Start Date  06/14/15   Interventions for Short Term Goal #2  Told pt about [facility] program. Request that he visit the facility and apply for their benefits for his wife.    [facility] CM Care Plan Problem Three        Most Recent Value   Care Plan Problem Three  No Advanced Directives.   Role Documenting the Problem Three  Care Management Coordinator   Care Plan for Problem Three  Not Active   [facility] CM Short Term Goal #1 (0-30 days)  Complete MOST form.   [facility] CM Short Term Goal #1 Start Date  04/18/15   [facility] CM Short Term Goal #1 Met Date  04/18/15   Interventions for Short Term Goal #1  Educated on importance of have a MOST form in the home so pt wishes can be followed. Pt doesn't want CPR or life support.       Deloria Lair [facility] [phone]

## 2015-07-19 ENCOUNTER — Other Ambulatory Visit: Payer: Self-pay | Admitting: *Deleted

## 2015-07-19 NOTE — Patient Outreach (Signed)
[facility]) Care Management  07/19/2015  MERYLE PUGMIRE 1930-06-10 [id number]   S:  Pt seems to be doing well. No new problems. She is eating well. Weight seems to be stable although Mr. Penninger forgets to weigh her often as I have instructed him to do for general HF monitoring. She takes her medications without problems.  Mr. Stantz has not followed through on getting the [facility] service engaged.  O:  BP 126/56 mmHg  Pulse 76  Resp 18  Wt 117 lb (53.071 kg)  SpO2 98%       Irregular heart rhythm with mumur.       Lungs are clear       Abdomen with bowel sounds present and soft, nontender       No edema       Long thick toenails.  A:  Alzheimer's disease      Onychomycotic toenails  P:  Discharge visit today. I have advised Mr. Inscoe of this and encouraged him to get the [facility] service engaged so he can have a caregiver come help him  I trimmed up her nails for her today. I have advised Mr. Newhall that if they have future needs, I am available to them.  [facility] CM Care Plan Problem One        Most Recent Value   Care Plan Problem One  CHF   Role Documenting the Problem One  Care Management Coordinator   Care Plan for Problem One  Active   [facility] Long Term Goal (31-90 days)  Pt will not be hospitalized in the next 90 days.   [facility] Long Term Goal Start Date  04/18/15   [facility] Long Term Goal Met Date  07/19/15   [facility] CM Short Term Goal #1 (0-30 days)  Husband will resume assisting wife to weigh daily and recording the weight over the next 30 days.   [facility] CM Short Term Goal #1 Start Date  04/18/15   [facility] CM Short Term Goal #1 Met Date  05/17/15   Interventions for Short Term Goal #1  Praised for this ongoing life long task.    [facility] CM Care Plan Problem Two        Most Recent Value   Care Plan Problem Two  Pt is resisting bathing and hygienic care from husband.   Role Documenting the Problem Two  Care Management Coordinator   Care Plan for Problem Two  Active   [facility] CM Short Term  Goal #1 (0-30 days)  Husband will hire aid to come once a week to assist with personal care and give him some respite.   [facility] CM Short Term Goal #1 Start Date  04/18/15   [facility] CM Short Term Goal #1 Met Date   -- [Not met.]   Interventions for Short Term Goal #2   Husband has information but has not followed through on pursuing getting this in home assistance.   [facility] CM Short Term Goal #2 (0-30 days)  Pt to apply for [facility] program with in the next 30 days.   [facility] CM Short Term Goal #2 Start Date  06/14/15   [facility] CM Short Term Goal #2 Met Date  -- [Not met.]   Interventions for Short Term Goal #2  Has not done. I have encoiuraged him to follow through on this to be proactive in getting the services she will need before it is a big problem.    [facility] CM Care Plan Problem Three  Most Recent Value   Care Plan Problem Three  No Advanced Directives.   Role Documenting the Problem Three  Care Management Coordinator   Care Plan for Problem Three  Not Active   [facility] CM Short Term Goal #1 (0-30 days)  Complete MOST form.   [facility] CM Short Term Goal #1 Start Date  04/18/15   [facility] CM Short Term Goal #1 Met Date  04/18/15     Deloria Lair [facility] [place] [phone]

## 2015-07-22 ENCOUNTER — Encounter: Payer: Self-pay | Admitting: *Deleted

## 2015-08-01 DIAGNOSIS — M1990 Unspecified osteoarthritis, unspecified site: Secondary | ICD-10-CM | POA: Diagnosis not present

## 2015-08-01 DIAGNOSIS — R21 Rash and other nonspecific skin eruption: Secondary | ICD-10-CM | POA: Diagnosis not present

## 2015-08-01 DIAGNOSIS — K5900 Constipation, unspecified: Secondary | ICD-10-CM | POA: Diagnosis not present

## 2015-08-01 DIAGNOSIS — I10 Essential (primary) hypertension: Secondary | ICD-10-CM | POA: Diagnosis not present

## 2015-08-01 DIAGNOSIS — I509 Heart failure, unspecified: Secondary | ICD-10-CM | POA: Diagnosis not present

## 2015-08-01 DIAGNOSIS — F0390 Unspecified dementia without behavioral disturbance: Secondary | ICD-10-CM | POA: Diagnosis not present

## 2015-08-01 DIAGNOSIS — F411 Generalized anxiety disorder: Secondary | ICD-10-CM | POA: Diagnosis not present

## 2015-10-05 DIAGNOSIS — I509 Heart failure, unspecified: Secondary | ICD-10-CM | POA: Diagnosis not present

## 2015-10-05 DIAGNOSIS — L309 Dermatitis, unspecified: Secondary | ICD-10-CM | POA: Diagnosis not present

## 2015-10-07 DIAGNOSIS — I480 Paroxysmal atrial fibrillation: Secondary | ICD-10-CM | POA: Diagnosis not present

## 2015-10-07 DIAGNOSIS — I509 Heart failure, unspecified: Secondary | ICD-10-CM | POA: Diagnosis not present

## 2015-10-07 DIAGNOSIS — Z7982 Long term (current) use of aspirin: Secondary | ICD-10-CM | POA: Diagnosis not present

## 2015-10-07 DIAGNOSIS — M797 Fibromyalgia: Secondary | ICD-10-CM | POA: Diagnosis not present

## 2015-10-07 DIAGNOSIS — L309 Dermatitis, unspecified: Secondary | ICD-10-CM | POA: Diagnosis not present

## 2015-10-11 DIAGNOSIS — L309 Dermatitis, unspecified: Secondary | ICD-10-CM | POA: Diagnosis not present

## 2015-10-11 DIAGNOSIS — I480 Paroxysmal atrial fibrillation: Secondary | ICD-10-CM | POA: Diagnosis not present

## 2015-10-11 DIAGNOSIS — M797 Fibromyalgia: Secondary | ICD-10-CM | POA: Diagnosis not present

## 2015-10-11 DIAGNOSIS — Z7982 Long term (current) use of aspirin: Secondary | ICD-10-CM | POA: Diagnosis not present

## 2015-10-11 DIAGNOSIS — I509 Heart failure, unspecified: Secondary | ICD-10-CM | POA: Diagnosis not present

## 2015-10-14 DIAGNOSIS — L309 Dermatitis, unspecified: Secondary | ICD-10-CM | POA: Diagnosis not present

## 2015-10-14 DIAGNOSIS — I480 Paroxysmal atrial fibrillation: Secondary | ICD-10-CM | POA: Diagnosis not present

## 2015-10-14 DIAGNOSIS — M797 Fibromyalgia: Secondary | ICD-10-CM | POA: Diagnosis not present

## 2015-10-14 DIAGNOSIS — Z7982 Long term (current) use of aspirin: Secondary | ICD-10-CM | POA: Diagnosis not present

## 2015-10-14 DIAGNOSIS — I509 Heart failure, unspecified: Secondary | ICD-10-CM | POA: Diagnosis not present

## 2015-10-18 DIAGNOSIS — L309 Dermatitis, unspecified: Secondary | ICD-10-CM | POA: Diagnosis not present

## 2015-10-18 DIAGNOSIS — Z7982 Long term (current) use of aspirin: Secondary | ICD-10-CM | POA: Diagnosis not present

## 2015-10-18 DIAGNOSIS — I509 Heart failure, unspecified: Secondary | ICD-10-CM | POA: Diagnosis not present

## 2015-10-18 DIAGNOSIS — I480 Paroxysmal atrial fibrillation: Secondary | ICD-10-CM | POA: Diagnosis not present

## 2015-10-18 DIAGNOSIS — M797 Fibromyalgia: Secondary | ICD-10-CM | POA: Diagnosis not present

## 2015-10-21 DIAGNOSIS — I509 Heart failure, unspecified: Secondary | ICD-10-CM | POA: Diagnosis not present

## 2015-10-21 DIAGNOSIS — L309 Dermatitis, unspecified: Secondary | ICD-10-CM | POA: Diagnosis not present

## 2015-10-21 DIAGNOSIS — I480 Paroxysmal atrial fibrillation: Secondary | ICD-10-CM | POA: Diagnosis not present

## 2015-10-21 DIAGNOSIS — Z7982 Long term (current) use of aspirin: Secondary | ICD-10-CM | POA: Diagnosis not present

## 2015-10-21 DIAGNOSIS — M797 Fibromyalgia: Secondary | ICD-10-CM | POA: Diagnosis not present

## 2015-10-25 DIAGNOSIS — Z7982 Long term (current) use of aspirin: Secondary | ICD-10-CM | POA: Diagnosis not present

## 2015-10-25 DIAGNOSIS — I509 Heart failure, unspecified: Secondary | ICD-10-CM | POA: Diagnosis not present

## 2015-10-25 DIAGNOSIS — L309 Dermatitis, unspecified: Secondary | ICD-10-CM | POA: Diagnosis not present

## 2015-10-25 DIAGNOSIS — M797 Fibromyalgia: Secondary | ICD-10-CM | POA: Diagnosis not present

## 2015-10-25 DIAGNOSIS — I480 Paroxysmal atrial fibrillation: Secondary | ICD-10-CM | POA: Diagnosis not present

## 2015-10-27 DIAGNOSIS — I509 Heart failure, unspecified: Secondary | ICD-10-CM | POA: Diagnosis not present

## 2015-10-27 DIAGNOSIS — Z7982 Long term (current) use of aspirin: Secondary | ICD-10-CM | POA: Diagnosis not present

## 2015-10-27 DIAGNOSIS — I480 Paroxysmal atrial fibrillation: Secondary | ICD-10-CM | POA: Diagnosis not present

## 2015-10-27 DIAGNOSIS — L309 Dermatitis, unspecified: Secondary | ICD-10-CM | POA: Diagnosis not present

## 2015-10-27 DIAGNOSIS — M797 Fibromyalgia: Secondary | ICD-10-CM | POA: Diagnosis not present

## 2016-02-28 DIAGNOSIS — S6010XA Contusion of unspecified finger with damage to nail, initial encounter: Secondary | ICD-10-CM | POA: Diagnosis not present

## 2016-03-05 DIAGNOSIS — I499 Cardiac arrhythmia, unspecified: Secondary | ICD-10-CM | POA: Diagnosis not present

## 2016-03-05 DIAGNOSIS — F411 Generalized anxiety disorder: Secondary | ICD-10-CM | POA: Diagnosis not present

## 2016-03-05 DIAGNOSIS — M79644 Pain in right finger(s): Secondary | ICD-10-CM | POA: Diagnosis not present

## 2016-03-06 ENCOUNTER — Telehealth: Payer: Self-pay

## 2016-03-06 NOTE — Telephone Encounter (Signed)
SENT NOTES TO SCHEDULING 

## 2016-03-22 ENCOUNTER — Ambulatory Visit: Payer: Medicare Other | Admitting: Cardiology

## 2016-03-26 ENCOUNTER — Other Ambulatory Visit: Payer: Self-pay | Admitting: *Deleted

## 2016-03-26 DIAGNOSIS — I998 Other disorder of circulatory system: Secondary | ICD-10-CM

## 2016-03-30 ENCOUNTER — Encounter: Payer: Self-pay | Admitting: Vascular Surgery

## 2016-04-05 ENCOUNTER — Ambulatory Visit (INDEPENDENT_AMBULATORY_CARE_PROVIDER_SITE_OTHER): Payer: Medicare Other | Admitting: Vascular Surgery

## 2016-04-05 ENCOUNTER — Encounter: Payer: Self-pay | Admitting: Vascular Surgery

## 2016-04-05 ENCOUNTER — Ambulatory Visit (HOSPITAL_COMMUNITY)
Admission: RE | Admit: 2016-04-05 | Discharge: 2016-04-05 | Disposition: A | Discharge status: Home / Self Care | Payer: Medicare Other | Source: Ambulatory Visit | Attending: Vascular Surgery | Admitting: Vascular Surgery

## 2016-04-05 VITALS — BP 139/75 | HR 98 | Temp 97.5°F | Resp 20 | Ht 62.0 in | Wt 117.0 lb

## 2016-04-05 DIAGNOSIS — I998 Other disorder of circulatory system: Secondary | ICD-10-CM

## 2016-04-05 DIAGNOSIS — I739 Peripheral vascular disease, unspecified: Secondary | ICD-10-CM | POA: Diagnosis not present

## 2016-04-05 NOTE — Progress Notes (Signed)
Referring Physician: Dr Via  Patient name: [name] MRN: [id number] DOB: 1931/01/24 Sex: female  REASON FOR CONSULT: Ischemic right third finger  HPI: [name] is a 83 y.o. female, with fairly severe dementia and hearing loss referred for evaluation of a nonhealing wound right third finger with ischemia. History was obtained from the patient's husband today as the patient has severe dementia. Patient developed an infection in the right third finger several weeks ago. This has failed to heal. She developed dark discoloration of the tip of the finger as well. This is painful to touch but it is difficult to determine how much it bothers her due to her dementia. She does have a history of atrial arrhythmia. She is supposed to be going for follow-up with Dr. Luther Parody in the near future. She currently is not on anticoagulation. The finger infection was treated with Keflex. Other medical problems include congestive failure with several previous episodes of flash pulmonary edema. This has been stable.   Past Medical History:  Diagnosis Date  . Allergy   . CHF (congestive heart failure) ([place])   . Dementia   . Fibromyalgia    jt pains, Tramadol Miller 0-4 per day   . Fractured hip ([place]) 10/2010   fractured left hip, L THR   . IBS (irritable bowel syndrome)    IBS-constipation  . On prednisone therapy 2008-2010   Continuous prednisone therapy   . Palpitations    paroxysmal atrial tachycardia, EF normal, diastolic dysfunction-echo/Holter 10/10 (she did not wish to take medications prescribed, metoprolol/HCTZ)  . PNA (pneumonia)    w/lung abscess at 61 months of age   Past Surgical History:  Procedure Laterality Date  . CHOLECYSTECTOMY  1998  . COLONOSCOPY     2005 wnl per old records  . CYST EXCISION     breast cyst removed   . LOBECTOMY  1956   lung, partial   . NASAL SINUS SURGERY  1982    Family History  Problem Relation Age of Onset  . Heart attack Mother   . Lupus  Paternal Grandmother     SOCIAL HISTORY: Social History   Social History  . Marital status: Married    Spouse name: N/A  . Number of children: N/A  . Years of education: N/A   Occupational History  . Not on file.   Social History Main Topics  . Smoking status: Former Research scientist (life sciences)  . Smokeless tobacco: Never Used  . Alcohol use No  . Drug use: No  . Sexual activity: No   Other Topics Concern  . Not on file   Social History Narrative  . No narrative on file    Allergies  Allergen Reactions  . Compazine [Prochlorperazine Edisylate]     could not talk or hold head up  . Demerol [Meperidine]     itch, nasal cong  . Dilaudid [Hydromorphone Hcl]     itch  . Novocain [Procaine]     heart races  . Other     "some antibiotic": muscle flexion Tetanus-Diphth-Acell Pertussis: left side numb    Current Outpatient Prescriptions  Medication Sig Dispense Refill  . aspirin EC 81 MG EC tablet Take 1 tablet (81 mg total) by mouth daily. 30 tablet 0  . clotrimazole-betamethasone (LOTRISONE) cream Apply 1 application topically 2 (two) times daily.    Marland Kitchen econazole nitrate 1 % cream Apply 1 application topically 2 (two) times daily. Reported on 04/18/2015    . escitalopram (LEXAPRO) 10  MG tablet Take 10 mg by mouth daily.    . furosemide (LASIX) 20 MG tablet Take 1 tablet (20 mg total) by mouth daily. 30 tablet 0  . hydrOXYzine (ATARAX/VISTARIL) 25 MG tablet Take 25 mg by mouth 3 (three) times daily as needed for anxiety. HydrOXYzine HCl 25 Tablet 1/2 to 1 tablet Three times a day Orally 30 days    . lisinopril (PRINIVIL,ZESTRIL) 2.5 MG tablet Take 1 tablet (2.5 mg total) by mouth daily. 30 tablet 0  . metoprolol tartrate (LOPRESSOR) 25 MG tablet Take 0.5 tablets (12.5 mg total) by mouth 2 (two) times daily. 60 tablet 0  . potassium chloride SA (K-DUR,KLOR-CON) 20 MEQ tablet Take 1 tablet (20 mEq total) by mouth daily. 30 tablet 0   No current facility-administered medications for this visit.      ROS:   Unable to obtain secondary to patient's dementia.  Physical Examination  Vitals:   04/05/16 1026  BP: 139/75  Pulse: 98  Resp: 20  Temp: 97.5 F (36.4 C)  TempSrc: Oral  SpO2: 93%  Weight: 117 lb (53.1 kg)  Height: 5\' 2"  (1.575 m)    Body mass index is 21.4 kg/m.  General:  Alert and oriented, no acute distress HEENT: Profound hearing loss  Neck: No bruit or JVD Pulmonary: Clear to auscultation bilaterally Cardiac: Irregularly irregular no murmur  Abdomen: Soft, non-tender, non-distended, no mass Skin: No rash, distal phalanx right third finger dusky in appearance with dry eschar at the tip no surrounding erythema or purulent drainage Extremity Pulses:  2+ radial, brachial pulses bilaterally Musculoskeletal: No deformity or edema  Neurologic: Upper and lower extremity motor 5/5 and symmetric  DATA:  Patient had an upper extremity arterial duplex exam on the right side which showed triphasic waveforms in the brachial radial and ulnar arteries. We were unable to obtain digital waveforms in the third digit because she could not tolerate a cuff on this finger.  ASSESSMENT:  Digital ischemia right third finger with early gangrenous changes tip of finger. She does not have evidence of large vessel occlusive disease with widely patent radial ulnar and brachial arteries. No options for vascular reconstruction. Most likely this was an atheromatous embolic event to her third finger at some point.   PLAN:  I encouraged the patient's husband to seek follow-up with Dr. Marlou Porch for further evaluation of her atrial rhythm and whether or not she would need an echocardiogram to look for embolic source. However with the patient's overall debility she certainly would not be a candidate for anticoagulation.  As far as the finger is concerned no vascular surgical options for revascularization. If she has persistent pain or progressive changes to the tip of the finger she could be  evaluated by hand surgery for consideration of amputation of the tip of this finger. This was discussed with the patient and her husband today. They will consider whether or not they wish to pursue this avenue. She will follow-up with me on an as-needed basis.   Ruta Hinds, MD [facility] Office: [phone] Pager: [phone]

## 2016-04-20 ENCOUNTER — Encounter (INDEPENDENT_AMBULATORY_CARE_PROVIDER_SITE_OTHER): Payer: Self-pay

## 2016-04-20 ENCOUNTER — Ambulatory Visit: Payer: Medicare Other | Admitting: Cardiology

## 2016-04-23 DIAGNOSIS — M79644 Pain in right finger(s): Secondary | ICD-10-CM | POA: Diagnosis not present

## 2016-04-23 DIAGNOSIS — I96 Gangrene, not elsewhere classified: Secondary | ICD-10-CM | POA: Diagnosis not present

## 2016-04-23 DIAGNOSIS — M19041 Primary osteoarthritis, right hand: Secondary | ICD-10-CM | POA: Diagnosis not present

## 2016-04-24 ENCOUNTER — Other Ambulatory Visit (HOSPITAL_COMMUNITY): Payer: Medicare Other

## 2016-04-24 ENCOUNTER — Encounter: Payer: Medicare Other | Admitting: Vascular Surgery

## 2016-04-26 ENCOUNTER — Other Ambulatory Visit: Payer: Self-pay | Admitting: Orthopedic Surgery

## 2016-04-26 DIAGNOSIS — M79644 Pain in right finger(s): Secondary | ICD-10-CM

## 2016-05-02 ENCOUNTER — Ambulatory Visit
Admission: RE | Admit: 2016-05-02 | Discharge: 2016-05-02 | Disposition: A | Discharge status: Home / Self Care | Payer: Medicare Other | Source: Ambulatory Visit | Attending: Orthopedic Surgery | Admitting: Orthopedic Surgery

## 2016-05-02 DIAGNOSIS — M79644 Pain in right finger(s): Secondary | ICD-10-CM

## 2016-05-02 DIAGNOSIS — I998 Other disorder of circulatory system: Secondary | ICD-10-CM | POA: Diagnosis not present

## 2016-05-02 NOTE — Consult Note (Signed)
Chief Complaint: Right third digit skin changes.   Referring Physician(s): Kuzma,Gary  History of Present Illness: [name] is a 83 y.o. female presenting as a scheduled appointment to vascular interventional radiology clinic, kindly referred by Dr. Fredna Dow, for evaluation of right middle finger skin changes/possible ischemia, and workup for formal angiogram.  She presents today with her husband, who provides the majority of the history as she has significant dementia. They tell me that several weeks ago skin changes occurred in the tip of the middle finger, which now demonstrates a scab. There are mild color changes, with dusky appearance at the very tip. It seems tender to palpate the tip of this finger.  Her husband tells me she has not had any recent hospitalizations. She was admitted in 2016 for congestive heart failure. There has been no concern for any systemic infections recently, although she was treated with a course of antibiotics for the changes of her right third finger.  She is known to have irregular heartbeat, and I'm told that she has an upcoming appointment in April with cardiology. Currently she is not treated for arrhythmia.  Past Medical History:  Diagnosis Date  . Allergy   . CHF (congestive heart failure) ([place])   . Dementia   . Fibromyalgia    jt pains, Tramadol Miller 0-4 per day   . Fractured hip ([place]) 10/2010   fractured left hip, L THR   . IBS (irritable bowel syndrome)    IBS-constipation  . On prednisone therapy 2008-2010   Continuous prednisone therapy   . Palpitations    paroxysmal atrial tachycardia, EF normal, diastolic dysfunction-echo/Holter 10/10 (she did not wish to take medications prescribed, metoprolol/HCTZ)  . PNA (pneumonia)    w/lung abscess at 23 months of age    Past Surgical History:  Procedure Laterality Date  . CHOLECYSTECTOMY  1998  . COLONOSCOPY     2005 wnl per old records  . CYST EXCISION     breast cyst removed    . LOBECTOMY  1956   lung, partial   . NASAL SINUS SURGERY  1982    Allergies: Compazine [prochlorperazine edisylate]; Demerol [meperidine]; Dilaudid [hydromorphone hcl]; Novocain [procaine]; and Other  Medications: Prior to Admission medications   Medication Sig Start Date End Date Taking? Authorizing Provider  aspirin EC 81 MG EC tablet Take 1 tablet (81 mg total) by mouth daily. 07/31/14  Yes Florencia Reasons, MD  furosemide (LASIX) 20 MG tablet Take 1 tablet (20 mg total) by mouth daily. 08/10/14  Yes Domenic Polite, MD  hydrOXYzine (ATARAX/VISTARIL) 25 MG tablet Take 25 mg by mouth 3 (three) times daily as needed for anxiety. HydrOXYzine HCl 25 Tablet 1/2 to 1 tablet Three times a day Orally 30 days   Yes Historical Provider, MD  lisinopril (PRINIVIL,ZESTRIL) 2.5 MG tablet Take 1 tablet (2.5 mg total) by mouth daily. 08/01/14  Yes Florencia Reasons, MD  metoprolol tartrate (LOPRESSOR) 25 MG tablet Take 0.5 tablets (12.5 mg total) by mouth 2 (two) times daily. 07/31/14  Yes Florencia Reasons, MD  clotrimazole-betamethasone (LOTRISONE) cream Apply 1 application topically 2 (two) times daily.    Historical Provider, MD  econazole nitrate 1 % cream Apply 1 application topically 2 (two) times daily. Reported on 04/18/2015    Historical Provider, MD  escitalopram (LEXAPRO) 10 MG tablet Take 10 mg by mouth daily.    Historical Provider, MD  potassium chloride SA (K-DUR,KLOR-CON) 20 MEQ tablet Take 1 tablet (20 mEq total) by mouth daily.  Patient not taking: Reported on 05/02/2016 08/10/14   Domenic Polite, MD     Family History  Problem Relation Age of Onset  . Heart attack Mother   . Lupus Paternal Grandmother     Social History   Social History  . Marital status: Married    Spouse name: N/A  . Number of children: N/A  . Years of education: N/A   Social History Main Topics  . Smoking status: Former Research scientist (life sciences)  . Smokeless tobacco: Never Used  . Alcohol use No  . Drug use: No  . Sexual activity: No   Other Topics  Concern  . Not on file   Social History Narrative  . No narrative on file      Review of Systems: A 12 point ROS discussed and pertinent positives are indicated in the HPI above.  All other systems are negative.  Review of Systems  Vital Signs: BP 117/63 (BP Location: Left Arm, Patient Position: Sitting, Cuff Size: Normal)   Pulse 64   Temp 98.2 F (36.8 C) (Oral)   Resp 14   Ht 5\' 2"  (1.575 m)   Wt 115 lb (52.2 kg)   SpO2 100%   BMI 21.03 kg/m   Physical Exam  Targeted exam of the right hand demonstrates dusky changes at the distal third finger with a dry scab at the tip of her finger. She has palpable pulses at the radial and ulnar arteries. No weeping or open wounds.  Mallampati Score:     Imaging: No results found.  Labs:  CBC: No results for input(s): WBC, HGB, HCT, PLT in the last 8760 hours.  COAGS: No results for input(s): INR, APTT in the last 8760 hours.  BMP: No results for input(s): NA, K, CL, CO2, GLUCOSE, BUN, CALCIUM, CREATININE, GFRNONAA, GFRAA in the last 8760 hours.  Invalid input(s): CMP  LIVER FUNCTION TESTS: No results for input(s): BILITOT, AST, ALT, ALKPHOS, PROT, ALBUMIN in the last 8760 hours.  TUMOR MARKERS: No results for input(s): AFPTM, CEA, CA199, CHROMGRNA in the last 8760 hours.  Assessment and Plan:  [name] is an 83 yo female presenting with a wound on the distal aspect of her third finger, potentially embolic, as she has no history of connective tissue disorder, Raynauds syndrome, cold or heat exposure.   I discussed with her and her husband the principle of diagnostic angiogram, which would only serve as a test to better evaluate the small vessels of her hand. The noninvasive arterial exam performed 04/05/2016 is adequate to confirm no significant arterial occlusive disease to the wrist. Angiogram would be a better test for the small vessels of the hand.  I discussed the risks of an angiogram including bleeding,  infection, arterial injury, embolism, dissection, contrast reaction, renal failure,limb-loss, cardiopulmonary collapse, death.  They would like to proceed if possible. Regarding the logistics, I do think that her mental status will be difficult, and I did share with her husband the possibility of aborting the procedure if we cannot get her comfortable and sedated safely. He understands.   Plan:  - Proceed with diagnostic RUE angiogram with Dr. Earleen Newport.  Given her mental status, it might be safer to access radial artery on the ipsilateral side, as our hemostasis may be more reliable.  This can be assessed the day of the procedure.   Thank you for this interesting consult.  I greatly enjoyed meeting [name] and look forward to participating in their care.  A copy of this  report was sent to the requesting provider on this date.  Electronically Signed: Corrie Mckusick 05/02/2016, 6:27 PM   I spent a total of  30 Minutes   in face to face in clinical consultation, greater than 50% of which was counseling/coordinating care for ischemic right hand, possible angiogram

## 2016-05-04 ENCOUNTER — Other Ambulatory Visit (HOSPITAL_COMMUNITY): Payer: Self-pay | Admitting: Interventional Radiology

## 2016-05-04 DIAGNOSIS — M79644 Pain in right finger(s): Secondary | ICD-10-CM

## 2016-05-09 DIAGNOSIS — R002 Palpitations: Secondary | ICD-10-CM | POA: Diagnosis not present

## 2016-05-09 DIAGNOSIS — I491 Atrial premature depolarization: Secondary | ICD-10-CM | POA: Diagnosis not present

## 2016-05-09 DIAGNOSIS — I5032 Chronic diastolic (congestive) heart failure: Secondary | ICD-10-CM | POA: Diagnosis not present

## 2016-05-17 DIAGNOSIS — L03019 Cellulitis of unspecified finger: Secondary | ICD-10-CM | POA: Diagnosis not present

## 2016-05-17 DIAGNOSIS — B351 Tinea unguium: Secondary | ICD-10-CM | POA: Diagnosis not present

## 2016-05-25 ENCOUNTER — Ambulatory Visit (HOSPITAL_COMMUNITY): Admission: RE | Admit: 2016-05-25 | Payer: Medicare Other | Source: Ambulatory Visit

## 2016-05-29 DIAGNOSIS — R9431 Abnormal electrocardiogram [ECG] [EKG]: Secondary | ICD-10-CM | POA: Diagnosis not present

## 2016-05-29 DIAGNOSIS — R002 Palpitations: Secondary | ICD-10-CM | POA: Diagnosis not present

## 2016-05-29 DIAGNOSIS — I5032 Chronic diastolic (congestive) heart failure: Secondary | ICD-10-CM | POA: Diagnosis not present

## 2016-06-06 DIAGNOSIS — I5032 Chronic diastolic (congestive) heart failure: Secondary | ICD-10-CM | POA: Diagnosis not present

## 2016-06-06 DIAGNOSIS — I491 Atrial premature depolarization: Secondary | ICD-10-CM | POA: Diagnosis not present

## 2016-06-06 DIAGNOSIS — R002 Palpitations: Secondary | ICD-10-CM | POA: Diagnosis not present

## 2016-06-14 DIAGNOSIS — B351 Tinea unguium: Secondary | ICD-10-CM | POA: Diagnosis not present

## 2016-09-20 ENCOUNTER — Other Ambulatory Visit: Payer: Self-pay | Admitting: *Deleted

## 2016-09-20 NOTE — Patient Outreach (Signed)
[name] called me on Monday to request assistance with finding RESPITE care for his wife for a long weekend so he can go out of town with his son. [name] will need Respite in a memory impairment facility. I have previously counseled him on considering LTC placement. She would have to go on Medicaid as they are financially challenged.  I will make a LCSW referral. Request to give [name] a list of facilities and the cost of respite care so he can make an informed and appropriate decision.   Theresa Pont. Myrtie Neither, MSN, [facility] Gerontological Nurse Practitioner [facility] Care Management [phone]

## 2016-09-21 ENCOUNTER — Other Ambulatory Visit: Payer: Self-pay | Admitting: *Deleted

## 2016-09-21 ENCOUNTER — Encounter: Payer: Self-pay | Admitting: *Deleted

## 2016-09-21 NOTE — Patient Outreach (Signed)
[facility]) Care Management  09/21/2016  [name] 19-Jun-1930 [id number]   CSW made an initial attempt to try and contact patient today to perform phone assessment, as well as assess and assist with social needs and services, without success.  A HIPAA compliant message was left for patient on voicemail.  CSW is currently awaiting a return call. CSW will make a second outreach attempt within the next week, if CSW does not receive a return call from patient in the meantime. [name], BSW, MSW, LCSW  Licensed Education officer, environmental Health System  Mailing [place] [address], [place], [place] [address] Physical Address-[address], [place], [place] [address] Toll Free Main # [phone] Fax # [phone] Cell # [phone]  Office # [phone] [name]

## 2016-09-27 ENCOUNTER — Encounter: Payer: Self-pay | Admitting: *Deleted

## 2016-09-27 ENCOUNTER — Other Ambulatory Visit: Payer: Self-pay | Admitting: *Deleted

## 2016-09-27 NOTE — Patient Outreach (Signed)
Request received from Joanna Saporito, LCSW to mail patient personal care resources.  Information mailed today. 

## 2016-09-27 NOTE — Patient Outreach (Signed)
[facility]) Care Management  09/27/2016  LEWANNA PETRAK [birth year] [id number]  CSW was able to make initial contact with patient's husband, Devetta Hagenow today to perform the initial phone assessment on patient, as well as assess and assist with social work needs and services.  CSW introduced self, explained role and types of services provided through [place] Management ([place] [facility]).  CSW further explained to Mr. Masse that CSW works with patient's Geriatric Nurse Practitioner, also with [facility] Management, Deloria Lair. CSW then explained the reason for the call, indicating that Ms. Spinks thought that patient and Mr. Broski would benefit from social work services and resources to assist with obtaining respite care for patient so that Mr. Carriveau can get away for a long weekend.  CSW obtained two HIPAA compliant identifiers from Mr. Dayrit, which included patient's name and date of birth. Mr. Ribera admits that he would really like to go to the beach for [place] Day Weekend with his son, but reports not having anyone to stay with his wife while he is away.  Patient currently requires 24 hour care and supervision, due to memory deficits associated with Dementia.  Mr. Brunell admitted that patient literally follows him from room-to-room, so he does not think patient would even be receptive to leaving the home for any length of time, as he is unable to get patient out of the house now.  Mr. Kittel denies having family, friends and/or neighbors available to sit with patient, even for a few hours per day, let alone for an extended weekend. CSW spoke with Mr. Blake at length about various community agencies and resources that may be able to offer assistance in the home.  In addition, CSW agreed to mail Mr. Cara the following list of resources: [place] Management Consent for  Treatment [facility] ([facility] [employer]) [place] (Program of [place] for the Elderly) Brochure List of [place] Mr. Graciano reported that he simply would not be able to afford the expense of respite care in an assisted living facility, even for a long weekend.  In addition, Mr. Giambra did not think that patient would be receptive to an Adult Day Care Program, such as ACE and/or [facility]ACE.  Mr. Billman realizes the importance of having some time to himself, but reports that he is unsure about how to go about getting it.  Mr. Mclaren reported that his son is helpful at times and is willing to sit with patient so that he can run errands, go to lunch with friends, etc.  Mr. Dyches took down CSW's contact information, agreeing to contact CSW directly if he changes his mind and is interested in receiving social work services. CSW will perform a case closure on patient, as all goals of treatment have been met from social work standpoint and no additional social work needs have been identified at this time.  CSW will notify patient's Geriatric Nurse Practitioner with [facility] Management, Deloria Lair of CSW's plans to close patient's case.  CSW will fax an update to patient's Primary Care Physician, Dr. Lona Kettle to ensure that they are aware of CSW's involvement with patient's plan of care.  CSW will submit a case closure request to Verlon Setting, Care Management Assistant with [place] Management, in the form of an In [employer].  CSW will ensure that Mrs. Comer is aware  of Deloria Lair, Geriatric Nurse Practitioner with [facility] Management, continued involvement with patient's care. Nat Christen, BSW, MSW, LCSW  Licensed Education officer, environmental Health System  Mailing [place] [address], [place], [place] [address] Physical Address[address], [place], [place] [address] Toll Free Main # [phone] Fax # [phone] Cell # [phone]  Office # [phone] [name]

## 2016-09-28 ENCOUNTER — Ambulatory Visit: Payer: Self-pay | Admitting: *Deleted

## 2016-10-10 ENCOUNTER — Other Ambulatory Visit: Payer: Self-pay | Admitting: *Deleted

## 2016-10-10 NOTE — Patient Outreach (Signed)
Called Mr. Musil to discuss his concerns that he relayed through [employer] 1) He requests me to make a home visit but pt's condition has not changed and her condition is expected to decline. We have already had this discussion last year and Mr. Dabbs has not persued any of the recommendations that were given to him for resolving his wife's care needs. I have reaffirmed that he needs to apply for Medicaid for LTC and he needs to talk to the PACE group again as that would be the best thing for her at this time before going to a full time facility if he continues to want her to stay at home.  Nat Christen, LCSW, is sending out the [facility] application information again.  He voiced his appreciation for the guidance.  Eulah Pont. Myrtie Neither, MSN, [facility] Gerontological Nurse Practitioner [facility] Care Management [phone]

## 2016-10-10 NOTE — Patient Outreach (Signed)
[facility]) Care Management  10/10/2016  [name] Oct 14, 1930 [id number]   CSW received an In [employer] from [name], Care Management  Assistant with [place] Management, indicating that patient's husband, [name] is requesting to speak with CSW directly.  In talking with [name], he was actually wanting to know if [name], Geriatric Nurse Practitioner with [place] Management, could get approved for several more visits with patient, reporting that patient's Dementia is progressing rapidly.  CSW agreed to place an In Basket message to [name], requesting that she make contact with [name] to discuss further.  [name] was most appreciative of the return call. [name] also inquired about whether or not CSW thought that patient would be eligible to receive financial assistance through Adult Medicaid with the [place]. Based on patient and [name] combined income, CSW did believe that patient would be eligible, but encouraged [name] to go ahead and apply, just in case.  CSW even agreed to mail [name] an Adult Medicaid application so he can review information required for processing, before making a final decision.  [name] voiced understanding and was agreeable to this plan.  No additional social work needs have been identified at this time. [name], [name], [name], [name]  Licensed Education officer, environmental Health System  Mailing [place] [address] [place], [place] [address] Physical Address-[address], [place], [place] [address] Toll Free Main # [phone] Fax # [phone] Cell # [phone]  Office # [phone] [name]

## 2016-10-10 NOTE — Patient Outreach (Signed)
Request received from Joanna Saporito, LCSW to mail patient personal care resources.  Information mailed today. 

## 2016-10-19 NOTE — Patient Outreach (Signed)
Request received from Joanna Saporito, LCSW to mail patient personal care resources.  Information mailed today. 

## 2016-11-13 DIAGNOSIS — D649 Anemia, unspecified: Secondary | ICD-10-CM | POA: Diagnosis not present

## 2016-11-13 DIAGNOSIS — F0390 Unspecified dementia without behavioral disturbance: Secondary | ICD-10-CM | POA: Diagnosis not present

## 2016-11-13 DIAGNOSIS — I10 Essential (primary) hypertension: Secondary | ICD-10-CM | POA: Diagnosis not present

## 2016-11-13 DIAGNOSIS — I509 Heart failure, unspecified: Secondary | ICD-10-CM | POA: Diagnosis not present

## 2016-11-13 DIAGNOSIS — F411 Generalized anxiety disorder: Secondary | ICD-10-CM | POA: Diagnosis not present

## 2017-03-26 IMAGING — CR DG CHEST 1V PORT
1 series · 1 of 1 positions shown · non-contrast
Comparison: 08/05/2014

CLINICAL DATA: Central line placement.

EXAM:
PORTABLE CHEST - 1 VIEW

[AP]
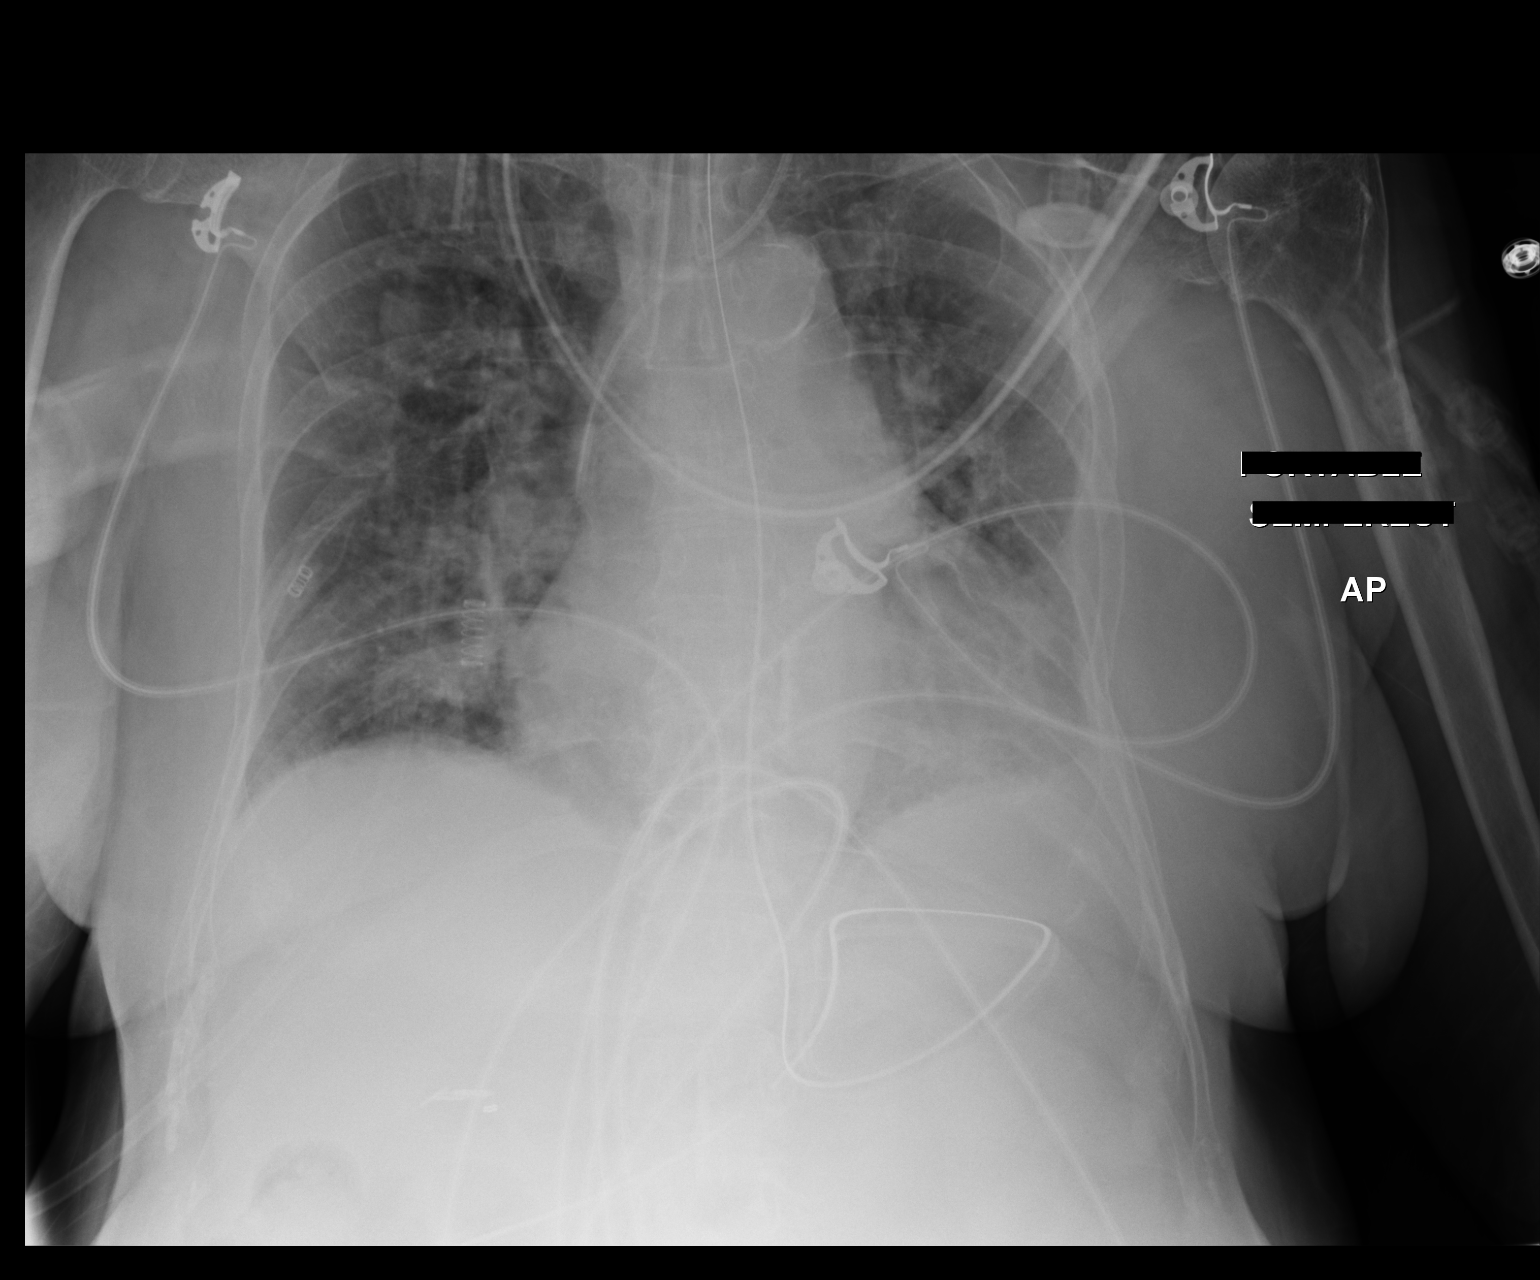

[1 of 1 positions shown; findings below may reference images not displayed]

FINDINGS: New left internal jugular central venous line. Tip lies in the lower
superior vena cava. No pneumothorax.

Endotracheal tube tip now projects at the [place]. Recommend
retracting 1-2 cm for more optimal positioning.

Nasogastric tube is well positioned with its tip in the distal
stomach.

Central vascular congestion and mild bilateral interstitial
thickening is similar to the prior study.
IMPRESSION: 1. Left IJ central venous line tip projects in the lower superior
vena cava. No pneumothorax.
2. Endotracheal tube tip now projects at the [place]. Recommend
retracting 1-2 cm.
3. No other change.

## 2017-03-27 IMAGING — CR DG CHEST 1V PORT
1 series · 1 of 1 positions shown · non-contrast
Comparison: Portable chest x-ray August 05, 2014

CLINICAL DATA: Respiratory failure, history of pneumonia

EXAM:
PORTABLE CHEST - 1 VIEW

[AP]
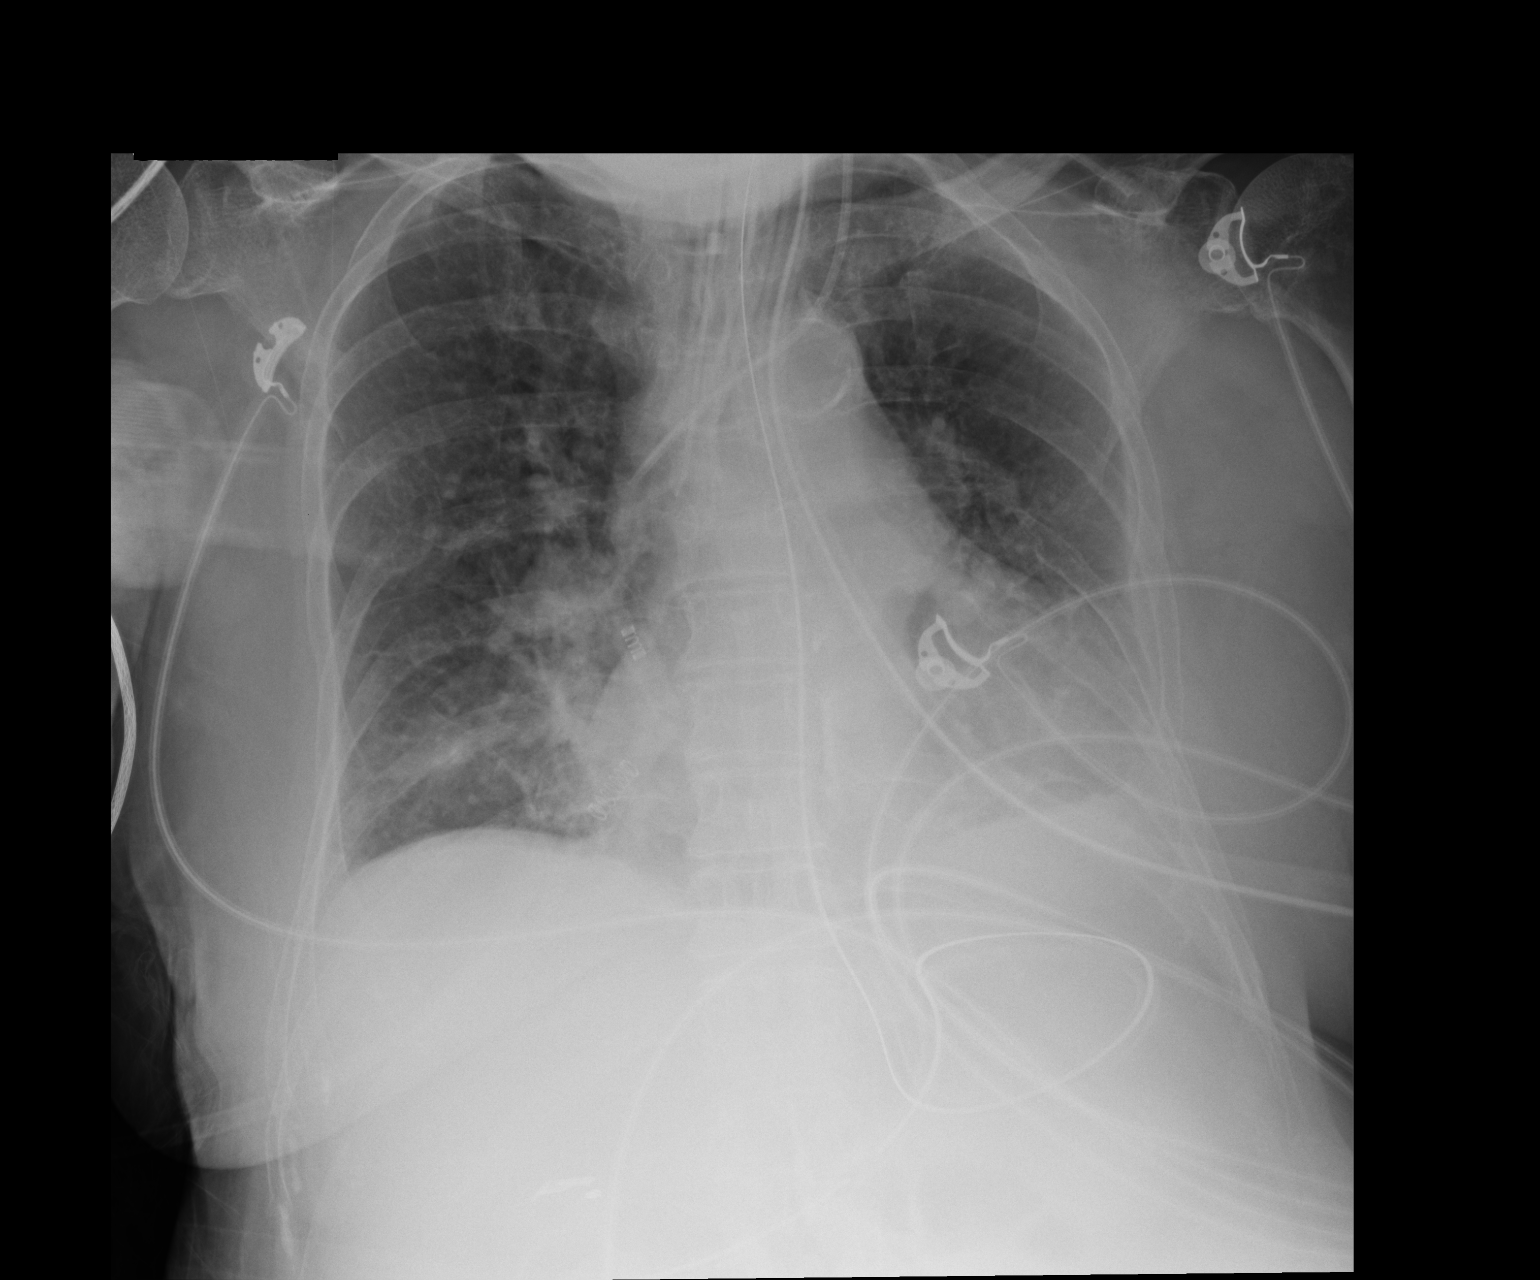

[1 of 1 positions shown; findings below may reference images not displayed]

FINDINGS: The lungs are reasonably well inflated. The endotracheal tube tip
lies at the origin of the right mainstem bronchus. The pulmonary
interstitial markings remain increased. A small amount of pleural
fluid on the left is present. The cardiac silhouette is enlarged.
The left internal jugular venous catheter tip projects over the
proximal SVC. The tip of the esophagogastric tube projects below the
inferior margin of the study.
IMPRESSION: Endotracheal tube tip at at the origin of the right mainstem
bronchus. Withdrawal by 2-3 cm is recommended. The pulmonary
interstitium has improved somewhat since yesterday's study
consistent with resolving pneumonia.

These results were called by telephone at the time of interpretation
on 08/06/2014 at [DATE] to Simah Arbi Fod, RN, who verbally
acknowledged these results.

## 2017-04-19 DIAGNOSIS — L03011 Cellulitis of right finger: Secondary | ICD-10-CM | POA: Diagnosis not present

## 2017-04-19 DIAGNOSIS — I10 Essential (primary) hypertension: Secondary | ICD-10-CM | POA: Diagnosis not present

## 2017-04-26 DIAGNOSIS — L089 Local infection of the skin and subcutaneous tissue, unspecified: Secondary | ICD-10-CM | POA: Diagnosis not present

## 2017-12-03 DIAGNOSIS — Z13220 Encounter for screening for lipoid disorders: Secondary | ICD-10-CM | POA: Diagnosis not present

## 2017-12-03 DIAGNOSIS — F0390 Unspecified dementia without behavioral disturbance: Secondary | ICD-10-CM | POA: Diagnosis not present

## 2017-12-03 DIAGNOSIS — M1990 Unspecified osteoarthritis, unspecified site: Secondary | ICD-10-CM | POA: Diagnosis not present

## 2017-12-03 DIAGNOSIS — I10 Essential (primary) hypertension: Secondary | ICD-10-CM | POA: Diagnosis not present

## 2017-12-03 DIAGNOSIS — Z0000 Encounter for general adult medical examination without abnormal findings: Secondary | ICD-10-CM | POA: Diagnosis not present

## 2017-12-03 DIAGNOSIS — I509 Heart failure, unspecified: Secondary | ICD-10-CM | POA: Diagnosis not present

## 2017-12-03 DIAGNOSIS — F411 Generalized anxiety disorder: Secondary | ICD-10-CM | POA: Diagnosis not present

## 2017-12-03 DIAGNOSIS — D649 Anemia, unspecified: Secondary | ICD-10-CM | POA: Diagnosis not present

## 2018-03-06 DIAGNOSIS — Z13220 Encounter for screening for lipoid disorders: Secondary | ICD-10-CM | POA: Diagnosis not present

## 2018-03-06 DIAGNOSIS — F411 Generalized anxiety disorder: Secondary | ICD-10-CM | POA: Diagnosis not present

## 2018-03-06 DIAGNOSIS — D649 Anemia, unspecified: Secondary | ICD-10-CM | POA: Diagnosis not present

## 2018-03-06 DIAGNOSIS — M1990 Unspecified osteoarthritis, unspecified site: Secondary | ICD-10-CM | POA: Diagnosis not present

## 2018-03-06 DIAGNOSIS — I509 Heart failure, unspecified: Secondary | ICD-10-CM | POA: Diagnosis not present

## 2018-03-06 DIAGNOSIS — I10 Essential (primary) hypertension: Secondary | ICD-10-CM | POA: Diagnosis not present

## 2018-06-10 ENCOUNTER — Other Ambulatory Visit: Payer: Self-pay | Admitting: *Deleted

## 2018-07-29 DIAGNOSIS — D485 Neoplasm of uncertain behavior of skin: Secondary | ICD-10-CM | POA: Diagnosis not present

## 2018-07-30 DIAGNOSIS — M25531 Pain in right wrist: Secondary | ICD-10-CM | POA: Diagnosis not present

## 2018-09-04 DIAGNOSIS — L03012 Cellulitis of left finger: Secondary | ICD-10-CM | POA: Diagnosis not present

## 2018-12-30 DIAGNOSIS — I10 Essential (primary) hypertension: Secondary | ICD-10-CM | POA: Diagnosis not present

## 2018-12-30 DIAGNOSIS — I509 Heart failure, unspecified: Secondary | ICD-10-CM | POA: Diagnosis not present

## 2018-12-30 DIAGNOSIS — Z13220 Encounter for screening for lipoid disorders: Secondary | ICD-10-CM | POA: Diagnosis not present

## 2018-12-30 DIAGNOSIS — F411 Generalized anxiety disorder: Secondary | ICD-10-CM | POA: Diagnosis not present

## 2019-01-05 DIAGNOSIS — Z0000 Encounter for general adult medical examination without abnormal findings: Secondary | ICD-10-CM | POA: Diagnosis not present

## 2019-03-16 ENCOUNTER — Ambulatory Visit: Payer: Medicare Other | Attending: Internal Medicine

## 2019-05-25 DIAGNOSIS — D649 Anemia, unspecified: Secondary | ICD-10-CM | POA: Diagnosis not present

## 2019-05-25 DIAGNOSIS — M1990 Unspecified osteoarthritis, unspecified site: Secondary | ICD-10-CM | POA: Diagnosis not present

## 2019-05-25 DIAGNOSIS — I509 Heart failure, unspecified: Secondary | ICD-10-CM | POA: Diagnosis not present

## 2019-05-25 DIAGNOSIS — F0390 Unspecified dementia without behavioral disturbance: Secondary | ICD-10-CM | POA: Diagnosis not present

## 2019-05-25 DIAGNOSIS — I10 Essential (primary) hypertension: Secondary | ICD-10-CM | POA: Diagnosis not present

## 2019-08-01 ENCOUNTER — Other Ambulatory Visit: Payer: Self-pay

## 2019-08-01 ENCOUNTER — Emergency Department (HOSPITAL_COMMUNITY): Payer: Medicare Other

## 2019-08-01 ENCOUNTER — Encounter (HOSPITAL_COMMUNITY): Payer: Self-pay

## 2019-08-01 ENCOUNTER — Inpatient Hospital Stay (HOSPITAL_COMMUNITY)
Admission: RE | Admit: 2019-08-01 | Discharge: 2019-08-10 | DRG: 291 | Disposition: A | Discharge status: Hospice | Payer: Medicare Other | Attending: Family Medicine | Admitting: Family Medicine

## 2019-08-01 DIAGNOSIS — Z888 Allergy status to other drugs, medicaments and biological substances status: Secondary | ICD-10-CM

## 2019-08-01 DIAGNOSIS — Z9049 Acquired absence of other specified parts of digestive tract: Secondary | ICD-10-CM

## 2019-08-01 DIAGNOSIS — E876 Hypokalemia: Secondary | ICD-10-CM | POA: Diagnosis not present

## 2019-08-01 DIAGNOSIS — Z681 Body mass index (BMI) 19 or less, adult: Secondary | ICD-10-CM

## 2019-08-01 DIAGNOSIS — R1310 Dysphagia, unspecified: Secondary | ICD-10-CM | POA: Diagnosis present

## 2019-08-01 DIAGNOSIS — Z515 Encounter for palliative care: Secondary | ICD-10-CM | POA: Diagnosis not present

## 2019-08-01 DIAGNOSIS — J9602 Acute respiratory failure with hypercapnia: Secondary | ICD-10-CM | POA: Diagnosis present

## 2019-08-01 DIAGNOSIS — I110 Hypertensive heart disease with heart failure: Secondary | ICD-10-CM | POA: Diagnosis not present

## 2019-08-01 DIAGNOSIS — Z8249 Family history of ischemic heart disease and other diseases of the circulatory system: Secondary | ICD-10-CM

## 2019-08-01 DIAGNOSIS — I4811 Longstanding persistent atrial fibrillation: Secondary | ICD-10-CM | POA: Diagnosis present

## 2019-08-01 DIAGNOSIS — F0390 Unspecified dementia without behavioral disturbance: Secondary | ICD-10-CM | POA: Diagnosis present

## 2019-08-01 DIAGNOSIS — R0600 Dyspnea, unspecified: Secondary | ICD-10-CM | POA: Diagnosis present

## 2019-08-01 DIAGNOSIS — Z885 Allergy status to narcotic agent status: Secondary | ICD-10-CM

## 2019-08-01 DIAGNOSIS — I350 Nonrheumatic aortic (valve) stenosis: Secondary | ICD-10-CM | POA: Diagnosis present

## 2019-08-01 DIAGNOSIS — Z7982 Long term (current) use of aspirin: Secondary | ICD-10-CM

## 2019-08-01 DIAGNOSIS — F0391 Unspecified dementia with behavioral disturbance: Secondary | ICD-10-CM

## 2019-08-01 DIAGNOSIS — E43 Unspecified severe protein-calorie malnutrition: Secondary | ICD-10-CM | POA: Diagnosis present

## 2019-08-01 DIAGNOSIS — I4892 Unspecified atrial flutter: Secondary | ICD-10-CM

## 2019-08-01 DIAGNOSIS — G4700 Insomnia, unspecified: Secondary | ICD-10-CM | POA: Diagnosis present

## 2019-08-01 DIAGNOSIS — H9190 Unspecified hearing loss, unspecified ear: Secondary | ICD-10-CM | POA: Diagnosis present

## 2019-08-01 DIAGNOSIS — Z79899 Other long term (current) drug therapy: Secondary | ICD-10-CM

## 2019-08-01 DIAGNOSIS — F0281 Dementia in other diseases classified elsewhere with behavioral disturbance: Secondary | ICD-10-CM | POA: Diagnosis present

## 2019-08-01 DIAGNOSIS — J9601 Acute respiratory failure with hypoxia: Secondary | ICD-10-CM | POA: Diagnosis present

## 2019-08-01 DIAGNOSIS — I5021 Acute systolic (congestive) heart failure: Secondary | ICD-10-CM | POA: Diagnosis present

## 2019-08-01 DIAGNOSIS — Z9114 Patient's other noncompliance with medication regimen: Secondary | ICD-10-CM | POA: Diagnosis not present

## 2019-08-01 DIAGNOSIS — Z87891 Personal history of nicotine dependence: Secondary | ICD-10-CM

## 2019-08-01 DIAGNOSIS — E162 Hypoglycemia, unspecified: Secondary | ICD-10-CM | POA: Diagnosis not present

## 2019-08-01 DIAGNOSIS — Z20822 Contact with and (suspected) exposure to covid-19: Secondary | ICD-10-CM | POA: Diagnosis present

## 2019-08-01 DIAGNOSIS — Z66 Do not resuscitate: Secondary | ICD-10-CM | POA: Diagnosis not present

## 2019-08-01 DIAGNOSIS — Z902 Acquired absence of lung [part of]: Secondary | ICD-10-CM

## 2019-08-01 DIAGNOSIS — E875 Hyperkalemia: Secondary | ICD-10-CM | POA: Diagnosis present

## 2019-08-01 DIAGNOSIS — I959 Hypotension, unspecified: Secondary | ICD-10-CM | POA: Diagnosis not present

## 2019-08-01 DIAGNOSIS — K589 Irritable bowel syndrome without diarrhea: Secondary | ICD-10-CM | POA: Diagnosis present

## 2019-08-01 LAB — CBC WITH DIFFERENTIAL/PLATELET
Abs Immature Granulocytes: 0.04 10*3/uL (ref 0.00–0.07)
Basophils Absolute: 0.1 10*3/uL (ref 0.0–0.1)
Basophils Relative: 2 %
Eosinophils Absolute: 0.6 10*3/uL — ABNORMAL HIGH (ref 0.0–0.5)
Eosinophils Relative: 9 %
HCT: 37.9 % (ref 36.0–46.0)
Hemoglobin: 11.4 g/dL — ABNORMAL LOW (ref 12.0–15.0)
Immature Granulocytes: 1 %
Lymphocytes Relative: 16 %
Lymphs Abs: 1 10*3/uL (ref 0.7–4.0)
MCH: 26.5 pg (ref 26.0–34.0)
MCHC: 30.1 g/dL (ref 30.0–36.0)
MCV: 88.1 fL (ref 80.0–100.0)
Monocytes Absolute: 0.8 10*3/uL (ref 0.1–1.0)
Monocytes Relative: 12 %
Neutro Abs: 3.9 10*3/uL (ref 1.7–7.7)
Neutrophils Relative %: 60 %
Platelets: 280 10*3/uL (ref 150–400)
RBC: 4.3 MIL/uL (ref 3.87–5.11)
RDW: 18 % — ABNORMAL HIGH (ref 11.5–15.5)
WBC: 6.4 10*3/uL (ref 4.0–10.5)
nRBC: 0 % (ref 0.0–0.2)

## 2019-08-01 LAB — CBC
HCT: 35.9 % — ABNORMAL LOW (ref 36.0–46.0)
Hemoglobin: 10.9 g/dL — ABNORMAL LOW (ref 12.0–15.0)
MCH: 26.7 pg (ref 26.0–34.0)
MCHC: 30.4 g/dL (ref 30.0–36.0)
MCV: 88 fL (ref 80.0–100.0)
Platelets: 342 K/uL (ref 150–400)
RBC: 4.08 MIL/uL (ref 3.87–5.11)
RDW: 18 % — ABNORMAL HIGH (ref 11.5–15.5)
WBC: 8.8 K/uL (ref 4.0–10.5)
nRBC: 0 % (ref 0.0–0.2)

## 2019-08-01 LAB — COMPREHENSIVE METABOLIC PANEL
ALT: 13 U/L (ref 0–44)
AST: 41 U/L (ref 15–41)
Albumin: 3.5 g/dL (ref 3.5–5.0)
Alkaline Phosphatase: 60 U/L (ref 38–126)
Anion gap: 9 (ref 5–15)
BUN: 10 mg/dL (ref 8–23)
CO2: 26 mmol/L (ref 22–32)
Calcium: 8.7 mg/dL — ABNORMAL LOW (ref 8.9–10.3)
Chloride: 98 mmol/L (ref 98–111)
Creatinine, Ser: 0.85 mg/dL (ref 0.44–1.00)
GFR calc Af Amer: >60 mL/min (ref >60)
GFR calc non Af Amer: >60 mL/min (ref >60)
Glucose, Bld: 86 mg/dL (ref 70–99)
Potassium: 6.2 mmol/L — ABNORMAL HIGH (ref 3.5–5.1)
Sodium: 133 mmol/L — ABNORMAL LOW (ref 135–145)
Total Bilirubin: 1.4 mg/dL — ABNORMAL HIGH (ref 0.3–1.2)
Total Protein: 7.2 g/dL (ref 6.5–8.1)

## 2019-08-01 LAB — CBG MONITORING, ED: Glucose-Capillary: 99 mg/dL (ref 70–99)

## 2019-08-01 LAB — BLOOD GAS, VENOUS
Acid-Base Excess: 2.5 mmol/L — ABNORMAL HIGH (ref 0.0–2.0)
Bicarbonate: 29.7 mmol/L — ABNORMAL HIGH (ref 20.0–28.0)
O2 Saturation: 36.6 %
Patient temperature: 98.6
pCO2, Ven: 62.1 mmHg — ABNORMAL HIGH (ref 44.0–60.0)
pH, Ven: 7.301 (ref 7.250–7.430)
pO2, Ven: 30.5 mmHg — CL (ref 32.0–45.0)

## 2019-08-01 LAB — LACTIC ACID, PLASMA
Lactic Acid, Venous: 1.8 mmol/L (ref 0.5–1.9)
Lactic Acid, Venous: 1.5 mmol/L (ref 0.5–1.9)

## 2019-08-01 LAB — BASIC METABOLIC PANEL
Anion gap: 10 (ref 5–15)
BUN: 12 mg/dL (ref 8–23)
CO2: 21 mmol/L — ABNORMAL LOW (ref 22–32)
Calcium: 8.5 mg/dL — ABNORMAL LOW (ref 8.9–10.3)
Chloride: 102 mmol/L (ref 98–111)
Creatinine, Ser: 0.81 mg/dL (ref 0.44–1.00)
GFR calc Af Amer: >60 mL/min (ref >60)
GFR calc non Af Amer: >60 mL/min (ref >60)
Glucose, Bld: 95 mg/dL (ref 70–99)
Potassium: 5.4 mmol/L — ABNORMAL HIGH (ref 3.5–5.1)
Sodium: 133 mmol/L — ABNORMAL LOW (ref 135–145)

## 2019-08-01 LAB — CREATININE, SERUM
Creatinine, Ser: 0.84 mg/dL (ref 0.44–1.00)
GFR calc Af Amer: >60 mL/min
GFR calc non Af Amer: >60 mL/min

## 2019-08-01 LAB — URINALYSIS, ROUTINE W REFLEX MICROSCOPIC
Bilirubin Urine: NEGATIVE
Glucose, UA: NEGATIVE mg/dL
Hgb urine dipstick: NEGATIVE
Ketones, ur: NEGATIVE mg/dL
Leukocytes,Ua: NEGATIVE
Nitrite: NEGATIVE
Protein, ur: NEGATIVE mg/dL
Specific Gravity, Urine: 1.015 (ref 1.005–1.030)
pH: 5 (ref 5.0–8.0)

## 2019-08-01 LAB — TSH: TSH: 1.554 u[IU]/mL (ref 0.350–4.500)

## 2019-08-01 LAB — SARS CORONAVIRUS 2 BY RT PCR (HOSPITAL ORDER, PERFORMED IN ~~LOC~~ HOSPITAL LAB): SARS Coronavirus 2: NEGATIVE

## 2019-08-01 LAB — GLUCOSE, CAPILLARY: Glucose-Capillary: 80 mg/dL (ref 70–99)

## 2019-08-01 LAB — PROCALCITONIN: Procalcitonin: <0.1 ng/mL

## 2019-08-01 LAB — BRAIN NATRIURETIC PEPTIDE: B Natriuretic Peptide: 413.3 pg/mL — ABNORMAL HIGH (ref 0.0–100.0)

## 2019-08-01 MED ORDER — METOPROLOL TARTRATE 12.5 MG HALF TABLET
12.5000 mg | ORAL_TABLET | Freq: Two times a day (BID) | ORAL | Status: DC
  Administered 2019-08-02 – 2019-08-03 (×2): 12.5 mg via ORAL
  Filled 2019-08-01 (×4): qty 1

## 2019-08-01 MED ORDER — LEVALBUTEROL HCL 1.25 MG/0.5ML IN NEBU: 1.2500 mg | INHALATION_SOLUTION | Freq: Three times a day (TID) | RESPIRATORY_TRACT | Status: DC

## 2019-08-01 MED ORDER — SODIUM CHLORIDE 0.9 % IV BOLUS
250.0000 mL | Freq: Once | INTRAVENOUS | Status: AC
  Administered 2019-08-01: 250 mL via INTRAVENOUS

## 2019-08-01 MED ORDER — IPRATROPIUM BROMIDE 0.02 % IN SOLN: 0.5000 mg | Freq: Three times a day (TID) | RESPIRATORY_TRACT | Status: DC

## 2019-08-01 MED ORDER — METOPROLOL TARTRATE 5 MG/5ML IV SOLN
5.0000 mg | INTRAVENOUS | Status: DC | PRN
  Administered 2019-08-02 – 2019-08-05 (×3): 5 mg via INTRAVENOUS
  Administered 2019-08-05: 2.5 mg via INTRAVENOUS
  Administered 2019-08-06: 5 mg via INTRAVENOUS
  Administered 2019-08-06: 2.5 mg via INTRAVENOUS
  Administered 2019-08-07 – 2019-08-10 (×10): 5 mg via INTRAVENOUS
  Filled 2019-08-01 (×17): qty 5

## 2019-08-01 MED ORDER — HALOPERIDOL LACTATE 5 MG/ML IJ SOLN
2.0000 mg | Freq: Once | INTRAMUSCULAR | Status: AC
  Administered 2019-08-01: 2 mg via INTRAVENOUS
  Filled 2019-08-01: qty 1

## 2019-08-01 MED ORDER — SENNOSIDES-DOCUSATE SODIUM 8.6-50 MG PO TABS
2.0000 | ORAL_TABLET | Freq: Every evening | ORAL | Status: DC | PRN
  Administered 2019-08-05: 2 via ORAL
  Filled 2019-08-01: qty 2

## 2019-08-01 MED ORDER — SENNOSIDES-DOCUSATE SODIUM 8.6-50 MG PO TABS: 1.0000 | ORAL_TABLET | Freq: Every evening | ORAL | Status: DC | PRN

## 2019-08-01 MED ORDER — ENOXAPARIN SODIUM 40 MG/0.4ML ~~LOC~~ SOLN
40.0000 mg | SUBCUTANEOUS | Status: DC
  Administered 2019-08-02 – 2019-08-04 (×4): 40 mg via SUBCUTANEOUS
  Filled 2019-08-01 (×3): qty 0.4

## 2019-08-01 MED ORDER — POLYETHYLENE GLYCOL 3350 17 G PO PACK: 17.0000 g | PACK | Freq: Every day | ORAL | Status: DC | PRN

## 2019-08-01 MED ORDER — ONDANSETRON HCL 4 MG PO TABS
4.0000 mg | ORAL_TABLET | Freq: Four times a day (QID) | ORAL | Status: DC | PRN
  Administered 2019-08-05: 4 mg via ORAL
  Filled 2019-08-01: qty 1

## 2019-08-01 MED ORDER — METOPROLOL TARTRATE 5 MG/5ML IV SOLN
5.0000 mg | Freq: Once | INTRAVENOUS | Status: AC
  Administered 2019-08-01: 2.5 mg via INTRAVENOUS
  Filled 2019-08-01: qty 5

## 2019-08-01 MED ORDER — ONDANSETRON HCL 4 MG/2ML IJ SOLN: 4.0000 mg | Freq: Four times a day (QID) | INTRAMUSCULAR | Status: DC | PRN

## 2019-08-01 MED ORDER — ACETAMINOPHEN 650 MG RE SUPP: 650.0000 mg | Freq: Four times a day (QID) | RECTAL | Status: DC | PRN

## 2019-08-01 MED ORDER — ACETAMINOPHEN 325 MG PO TABS
650.0000 mg | ORAL_TABLET | Freq: Four times a day (QID) | ORAL | Status: DC | PRN
  Administered 2019-08-02: 650 mg via ORAL
  Filled 2019-08-01: qty 2

## 2019-08-01 MED ORDER — LORAZEPAM 2 MG/ML IJ SOLN
1.0000 mg | Freq: Once | INTRAMUSCULAR | Status: AC
  Administered 2019-08-01: 1 mg via INTRAVENOUS
  Filled 2019-08-01: qty 1

## 2019-08-01 MED ORDER — SODIUM CHLORIDE 0.9 % IV BOLUS
500.0000 mL | Freq: Once | INTRAVENOUS | Status: AC
  Administered 2019-08-01: 500 mL via INTRAVENOUS

## 2019-08-01 MED ORDER — IPRATROPIUM BROMIDE 0.02 % IN SOLN
0.5000 mg | Freq: Two times a day (BID) | RESPIRATORY_TRACT | Status: DC
  Administered 2019-08-01 – 2019-08-04 (×5): 0.5 mg via RESPIRATORY_TRACT
  Filled 2019-08-01 (×6): qty 2.5

## 2019-08-01 MED ORDER — LEVALBUTEROL HCL 1.25 MG/0.5ML IN NEBU
1.2500 mg | INHALATION_SOLUTION | Freq: Two times a day (BID) | RESPIRATORY_TRACT | Status: DC
  Administered 2019-08-01 – 2019-08-04 (×5): 1.25 mg via RESPIRATORY_TRACT
  Filled 2019-08-01 (×6): qty 0.5

## 2019-08-01 NOTE — ED Provider Notes (Addendum)
[name] Provider Note   CSN: [id number] Arrival date & time: 08/01/19  7858     History No chief complaint on file.   [name] is a 86 y.o. adult.  HPI She presents by EMS for evaluation of insomnia and nausea.  Patient has dementia and cannot give history.  She is reported to not be taking her medicine for several weeks.  Her husband is with her and gives history.  He states that she has severe dementia, and is currently on cooperative with request to take her medicines.  She has not seen a doctor in person for 6 months but has had some telephone evaluations with her PCP.  She has not had any recent illnesses.  She has not had a Covid vaccine.  On EMS arrival to her home today, she was hypoxic "in the 35s."  She was treated with oxygen during transport.  Level 5 caveat-dementia    Past Medical History:  Diagnosis Date  . Allergy   . CHF (congestive heart failure) ([place])   . Dementia ([place])   . Fibromyalgia    jt pains, Tramadol [place] 0-4 per day   . Fractured hip ([place]) 10/2010   fractured left hip, L THR   . IBS (irritable bowel syndrome)    IBS-constipation  . On prednisone therapy 2008-2010   Continuous prednisone therapy   . Palpitations    paroxysmal atrial tachycardia, EF normal, diastolic dysfunction-echo/Holter 10/10 (she did not wish to take medications prescribed, metoprolol/HCTZ)  . PNA (pneumonia)    w/lung abscess at 38 months of age    Patient Active Problem List   Diagnosis Date Noted  . Acute respiratory failure ([place])   . Acute respiratory failure with hypoxia ([place])   . Shock ([place]) 08/06/2014  . Acute diastolic CHF (congestive heart failure) ([place]) 08/06/2014  . Acute respiratory failure with hypoxemia ([place]) 08/05/2014  . Respiratory failure ([place])   . Dyspnea 07/30/2014  . Acute CHF ([place]) 07/30/2014  . Accelerated hypertension 07/30/2014  . Anemia 07/30/2014  . Dementia ([place]) 07/30/2014  . Acute  confusional state 07/30/2014    Past Surgical History:  Procedure Laterality Date  . CHOLECYSTECTOMY  1998  . COLONOSCOPY     2005 wnl per old records  . CYST EXCISION     breast cyst removed   . LOBECTOMY  1956   lung, partial   . NASAL SINUS SURGERY  1982     OB History   No obstetric history on file.     Family History  Problem Relation Age of Onset  . Heart attack Mother   . Lupus Paternal Grandmother     Social History   Tobacco Use  . Smoking status: Former Research scientist (life sciences)  . Smokeless tobacco: Never Used  Substance Use Topics  . Alcohol use: No  . Drug use: No    Home Medications Prior to Admission medications   Medication Sig Start Date End Date Taking? Authorizing Provider  furosemide (LASIX) 20 MG tablet Take 1 tablet (20 mg total) by mouth daily. 08/10/14  Yes Domenic Polite, MD  hydrOXYzine (ATARAX/VISTARIL) 25 MG tablet Take 25 mg by mouth 3 (three) times daily as needed for anxiety. HydrOXYzine HCl 25 Tablet 1/2 to 1 tablet Three times a day Orally 30 days   Yes [provider]  lisinopril (PRINIVIL,ZESTRIL) 2.5 MG tablet Take 1 tablet (2.5 mg total) by mouth daily. 08/01/14  Yes Florencia Reasons, MD  metoprolol tartrate (LOPRESSOR) 25 MG  tablet Take 0.5 tablets (12.5 mg total) by mouth 2 (two) times daily. 07/31/14  Yes Florencia Reasons, MD  aspirin EC 81 MG EC tablet Take 1 tablet (81 mg total) by mouth daily. Patient not taking: Reported on 08/01/2019 07/31/14   Florencia Reasons, MD  potassium chloride SA (K-DUR,KLOR-CON) 20 MEQ tablet Take 1 tablet (20 mEq total) by mouth daily. Patient not taking: Reported on 05/02/2016 08/10/14   Domenic Polite, MD    Allergies    Compazine [prochlorperazine edisylate], Demerol [meperidine], Dilaudid [hydromorphone hcl], Novocain [procaine], and Other  Review of Systems   Review of Systems  Unable to perform ROS: Dementia    Physical Exam Updated Vital Signs BP (!) 88/55   Pulse (!) 50   Temp 98.4 F (36.9 C) (Rectal)   Resp (!) 21    SpO2 100%   Physical Exam Vitals and nursing note reviewed.  Constitutional:      General: He is in acute distress (Anxious, confused).     Appearance: He is well-developed. He is ill-appearing. He is not toxic-appearing or diaphoretic.  HENT:     Head: Normocephalic and atraumatic.     Right Ear: External ear normal.     Left Ear: External ear normal.  Eyes:     Conjunctiva/sclera: Conjunctivae normal.     Pupils: Pupils are equal, round, and reactive to light.  Neck:     Trachea: Phonation normal.  Cardiovascular:     Rate and Rhythm: Normal rate. Rhythm irregular.     Heart sounds: Normal heart sounds.  Pulmonary:     Effort: Pulmonary effort is normal. No respiratory distress.     Breath sounds: Normal breath sounds. No stridor.  Abdominal:     General: There is no distension.     Palpations: Abdomen is soft. There is no mass.     Tenderness: There is no abdominal tenderness. There is no guarding.  Musculoskeletal:        General: No swelling, tenderness, deformity or signs of injury. Normal range of motion.     Cervical back: Normal range of motion and neck supple.  Skin:    General: Skin is warm and dry.     Coloration: Skin is not jaundiced or pale.  Neurological:     Mental Status: He is alert.     Cranial Nerves: No cranial nerve deficit.     Motor: No abnormal muscle tone.     Coordination: Coordination normal.  Psychiatric:        Attention and Perception: He is inattentive.        Mood and Affect: Mood is anxious.        Speech: He is communicative. Speech is not delayed.        Behavior: Behavior is agitated and hyperactive.        Thought Content: Thought content is not paranoid.        Cognition and Memory: Cognition is impaired.        Judgment: Judgment is impulsive.     ED Results / Procedures / Treatments   Labs (all labs ordered are listed, but only abnormal results are displayed) Labs Reviewed  COMPREHENSIVE METABOLIC PANEL - Abnormal;  Notable for the following components:      Result Value   Sodium 133 (*)    Potassium 6.2 (*)    Calcium 8.7 (*)    Total Bilirubin 1.4 (*)    All other components within normal limits  CBC WITH DIFFERENTIAL/PLATELET - Abnormal;  Notable for the following components:   Hemoglobin 11.4 (*)    RDW 18.0 (*)    Eosinophils Absolute 0.6 (*)    All other components within normal limits  BLOOD GAS, VENOUS - Abnormal; Notable for the following components:   pCO2, Ven 62.1 (*)    pO2, Ven 30.5 (*)    Bicarbonate 29.7 (*)    Acid-Base Excess 2.5 (*)    All other components within normal limits  SARS CORONAVIRUS 2 BY RT PCR (HOSPITAL ORDER, [facility])  CULTURE, BLOOD (ROUTINE X 2)  CULTURE, BLOOD (ROUTINE X 2)  LACTIC ACID, PLASMA  URINALYSIS, ROUTINE W REFLEX MICROSCOPIC  LACTIC ACID, PLASMA    EKG EKG Interpretation  Date/Time:  Saturday August 01 2019 16:28:37 EDT Ventricular Rate:  153 PR Interval:    QRS Duration: 125 QT Interval:  304 QTC Calculation: 485 R Axis:   -67 Text Interpretation: Wide-QRS tachycardia RBBB and LAFB likely atrial flutter with rapid rate Since last tracing rate faster Confirmed by Daleen Bo [phone]) on 08/01/2019 4:33:07 PM   EKG Interpretation  Date/Time:  Saturday August 01 2019 16:56:20 EDT Ventricular Rate:  84 PR Interval:    QRS Duration: 128 QT Interval:  382 QTC Calculation: 452 R Axis:   -61 Text Interpretation: Atrial flutter RBBB and LAFB Since last tracing of earlier today HR improved, now in clear Atrial Flutter Confirmed by Daleen Bo [phone]) on 08/01/2019 5:00:56 PM         Radiology DG Chest Port 1 View  Result Date: 08/01/2019 CLINICAL DATA:  Hypoxia. EXAM: PORTABLE CHEST 1 VIEW COMPARISON:  08/07/2014 FINDINGS: Examination demonstrates mild volume loss of the left lung with stable mild elevation of the left hemidiaphragm. Minimal left base opacification which is chronic/stable. Lungs are  otherwise clear. Mild stable cardiomegaly. Remainder of the exam is unchanged. IMPRESSION: 1. No acute findings. Stable left basilar changes and mild stable volume loss of the left lung. 2.  Mild cardiomegaly. Electronically Signed   By: Marin Olp M.D.   On: 08/01/2019 11:33    Procedures .Critical Care Performed by: Daleen Bo, MD Authorized by: Daleen Bo, MD   Critical care provider statement:    Critical care time (minutes):  35   Critical care start time:  08/01/2019 12:50 PM   Critical care end time:  08/01/2019 5:30 PM   Critical care time was exclusive of:  Separately billable procedures and treating other patients   Critical care was necessary to treat or prevent imminent or life-threatening deterioration of the following conditions:  Circulatory failure, cardiac failure and respiratory failure   Critical care was time spent personally by me on the following activities:  Blood draw for specimens, development of treatment plan with patient or surrogate, discussions with consultants, evaluation of patient's response to treatment, examination of patient, obtaining history from patient or surrogate, ordering and performing treatments and interventions, ordering and review of laboratory studies, pulse oximetry, re-evaluation of patient's condition, review of old charts and ordering and review of radiographic studies   (including critical care time)  Medications Ordered in ED Medications  sodium chloride 0.9 % bolus 500 mL (0 mLs Intravenous Stopped 08/01/19 1355)  haloperidol lactate (HALDOL) injection 2 mg (2 mg Intravenous Given 08/01/19 1357)  LORazepam (ATIVAN) injection 1 mg (1 mg Intravenous Given 08/01/19 1523)  metoprolol tartrate (LOPRESSOR) injection 5 mg (2.5 mg Intravenous Given 08/01/19 1642)  sodium chloride 0.9 % bolus 250 mL (0 mLs Intravenous Stopped 08/01/19  1710)    ED Course  I have reviewed the triage vital signs and the nursing notes.  Pertinent labs &  imaging results that were available during my care of the patient were reviewed by me and considered in my medical decision making (see chart for details).  Clinical Course as of Aug 01 1710  Sat Aug 01, 2019  1352 Normal except sodium low, potassium high, calcium low.  Hyperkalemia, likely related to hemolysis, since creatinine normal.  Will repeat for clarification.  Comprehensive metabolic panel(!) [EW]  2671 Normal  Lactic acid, plasma [EW]  1353 Normal except CO2 elevated, oxygen low, bicarb elevated, acid-base elevated  Blood gas, venous(!!) [EW]  1353 Normal except hemoglobin low  CBC with Differential(!) [EW]  1353 Normal   Urinalysis, Routine w reflex microscopic [EW]  2458 Per radiologist, no acute abnormalities as compared to prior images.  DG Chest Port 1 View [EW]  1355 Normal  SARS Coronavirus 2 by RT PCR (hospital order, performed in [facility] hospital lab) Nasopharyngeal Nasopharyngeal Swab [EW]  1403 I entered the room and the husband states "she just took off all of the things."  Patient agitated, crying out, states "I am going to die."  Oxygen saturation monitor is not on.  I placed the oxygen saturation monitor on and within 2 minutes it was able to reach an oxygen saturation of 92%, while on room air.   [EW]  1517 Patient is calm/sleeping now.  Apparently about 10 minutes ago oxygen saturation dropped to 83% while on nasal cannula oxygen, following ministration of Ativan.  She was therefore placed on facemask oxygen with improvement to 100%.  Heart rate now elevated at 155.  Will check EKG.  She will likely need medication therapy.   [EW]    Clinical Course User Index [EW] Daleen Bo, MD   MDM Rules/Calculators/A&P                           Patient Vitals for the past 24 hrs:  BP Temp Temp src Pulse Resp SpO2  08/01/19 1657 (!) 88/55 -- -- -- (!) 21 --  08/01/19 1649 (!) 86/59 -- -- (!) 50 (!) 21 --  08/01/19 1647 93/68 -- -- 71 18 100 %  08/01/19 1630  119/72 -- -- (!) 150 (!) 22 100 %  08/01/19 1618 -- -- -- (!) 153 (!) 21 100 %  08/01/19 1611 -- -- -- (!) 154 (!) 26 100 %  08/01/19 1601 -- -- -- (!) 109 (!) 22 97 %  08/01/19 1600 131/86 -- -- -- -- --  08/01/19 1530 (!) 160/90 -- -- (!) 121 (!) 23 97 %  08/01/19 1430 (!) 143/132 -- -- 86 (!) 23 (!) 89 %  08/01/19 1330 (!) 132/97 -- -- (!) 128 20 95 %  08/01/19 1127 114/74 -- -- 76 (!) 25 98 %  08/01/19 1055 140/80 98.4 F (36.9 C) Rectal 77 20 99 %    1:55 PM Reevaluation with update and discussion. After initial assessment and treatment, an updated evaluation reveals patient continues to be agitated, nursing requests medication to help "keep her in bed.". Daleen Bo   5:10 PM-she remains lethargic after Ativan, now tachycardic, irregular atrial flutter, at 110/min.  Blood pressure is borderline low.  Discussion with husband at bedside.  He is in favor of hospitalizing patient for observation to ensure stabilization.  He believes that he can try to get her to take her  medicines regularly by "being stern with her."  She has not previously been treated with oxygen, and has had a very difficult time tolerating it, here today.  She removes it, when it is placed on her face, I removed monitoring leads.   Medical Decision Making:  This patient is presenting for evaluation of restlessness, insomnia, nausea, not taking medications and periods of agitation, which does require a range of treatment options, and is a complaint that involves a moderate risk of morbidity and mortality. The differential diagnoses include delirium, acute infection, metabolic instability, medication noncompliance. I decided to review old records, and in summary elderly patient with severe dementia, being managed at home, taking medications prescribed very irregularly, presenting with somewhat labile heart rate and oxygenation status.  In the field her oxygen saturation was low, she is not on chronic oxygen therapy.  I  obtained additional historical information from her husband at the bedside.  Clinical Laboratory Tests Ordered, included CBC, Metabolic panel and Urinalysis. Review indicates reassuring with mild hyperkalemia, related to probable hemolysis..   Cardiac Monitor Tracing which shows tachyarrhythmia with atrial flutter    Critical Interventions-clinical evaluation, laboratory testing, medication treatment, observation, chest x-ray stabilization of oxygenation status.  After These Interventions, the Patient was reevaluated and was found to be a very difficult management case at this time.  I suspect that her symptoms are mostly related to not taking prescribed medications.  She has atrial flutter, but would not be a candidate for anticoagulation because of her fall risk.  She likely has dyspnea, with hypoxia related to her tachyarrhythmia.  Patient will likely benefit from a period of observation with reinitiation of medication treatment to improve her clinical status and hopefully be able to discharge her without oxygen.  CRITICAL CARE-yes Performed by: Daleen Bo  Nursing Notes Reviewed/ Care Coordinated Applicable Imaging Reviewed Interpretation of Laboratory Data incorporated into ED treatment   5:16 PM-Consult complete with hospitalist. Patient case explained and discussed.  He agrees to admit patient for further evaluation and treatment. Call ended at 5:20 PM  Plan: Admit for observation.  Final Clinical Impression(s) / ED Diagnoses Final diagnoses:  Dementia with behavioral disturbance, unspecified dementia type ([place])  Noncompliance with medication regimen  Longstanding persistent atrial fibrillation [facility])    Rx / DC Orders ED Discharge Orders    None       Daleen Bo, MD 08/01/19 1722    Daleen Bo, MD 08/01/19 2037

## 2019-08-01 NOTE — Plan of Care (Signed)
°  Problem: Coping: °Goal: Level of anxiety will decrease °Outcome: Progressing °  °

## 2019-08-01 NOTE — ED Notes (Signed)
Date and time results received: 08/01/19 1:01 PM  (use smartphrase ".now" to insert current time)  Test: vbg o2  Critical Value: 30.5  Name of Provider Notified: Eulis Foster  Orders Received? Or Actions Taken?: Orders Received - See Orders for details

## 2019-08-01 NOTE — ED Notes (Signed)
Attempted to call report to 5W, but no answer.

## 2019-08-01 NOTE — H&P (Signed)
History and Physical    [name] LTJ:[id number] DOB: 1930-12-07 DOA: 08/01/2019  PCP: Lawerance Cruel, MD Patient coming from: Home  Chief Complaint: Nausea and shortness of breath.   HPI: [name] is a 86 y.o. adult with medical history significant of Dementia, Osteoarthritis, IBS, HLD, P A fib not on AC brought to the hospital for nausea, insomnia and shortness of breath.  Patient received Haldol and Ativan in the ER therefore unable to provide any history.  Husband at bedside.  Husband tells me at baseline patient is able to ambulate with a walker and carry on basic conversation with occasional episodes of delirium.  For the past several days she is had insomnia and this morning started complaining of nausea and shortness of breath.  EMS was called and was noted to be hypoxic with O2 saturation in 70s.  In the ER ABG showed PCO2 of 62, potassium 6.2, chest x-ray cardiomegaly, heart rate ranging from 80-130 and irregular.  Rest of the labs are unremarkable.  Due to agitation she was given Haldol followed by Ativan.  Medical team requested to admit the patient.  When I saw the patient she was very drowsy and difficult to arouse but overall protecting her airway on 3 L nasal cannula.  Heart rate ranging from 110-130.  Systolic blood pressure in 115s.   CODE STATUS per husband-full Social history: remote smoking in her teens, no alcohol use  Review of Systems: As per HPI otherwise 10 point review of systems negative.  Review of Systems Otherwise negative except as per HPI, including: Difficult to obtain from her.   Past Medical History:  Diagnosis Date  . Allergy   . CHF (congestive heart failure) ([place])   . Dementia ([place])   . Fibromyalgia    jt pains, Tramadol [place] 0-4 per day   . Fractured hip ([place]) 10/2010   fractured left hip, L THR   . IBS (irritable bowel syndrome)    IBS-constipation  . On prednisone therapy 2008-2010   Continuous prednisone therapy   .  Palpitations    paroxysmal atrial tachycardia, EF normal, diastolic dysfunction-echo/Holter 10/10 (she did not wish to take medications prescribed, metoprolol/HCTZ)  . PNA (pneumonia)    w/lung abscess at 24 months of age    Past Surgical History:  Procedure Laterality Date  . CHOLECYSTECTOMY  1998  . COLONOSCOPY     2005 wnl per old records  . CYST EXCISION     breast cyst removed   . LOBECTOMY  1956   lung, partial   . NASAL SINUS SURGERY  1982    SOCIAL HISTORY:  reports that he has quit smoking. He has never used smokeless tobacco. He reports that he does not drink alcohol and does not use drugs.  Allergies  Allergen Reactions  . Compazine [Prochlorperazine Edisylate]     could not talk or hold head up  . Demerol [Meperidine]     itch, nasal cong  . Dilaudid [Hydromorphone Hcl]     itch  . Novocain [Procaine]     heart races  . Other     "some antibiotic": muscle flexion Tetanus-Diphth-Acell Pertussis: left side numb    FAMILY HISTORY: Family History  Problem Relation Age of Onset  . Heart attack Mother   . Lupus Paternal Grandmother      Prior to Admission medications   Medication Sig Start Date End Date Taking? Authorizing Provider  furosemide (LASIX) 20 MG tablet Take 1 tablet (20 mg  total) by mouth daily. 08/10/14  Yes Domenic Polite, MD  hydrOXYzine (ATARAX/VISTARIL) 25 MG tablet Take 25 mg by mouth 3 (three) times daily as needed for anxiety. HydrOXYzine HCl 25 Tablet 1/2 to 1 tablet Three times a day Orally 30 days   Yes [provider]  lisinopril (PRINIVIL,ZESTRIL) 2.5 MG tablet Take 1 tablet (2.5 mg total) by mouth daily. 08/01/14  Yes Florencia Reasons, MD  metoprolol tartrate (LOPRESSOR) 25 MG tablet Take 0.5 tablets (12.5 mg total) by mouth 2 (two) times daily. 07/31/14  Yes Florencia Reasons, MD  aspirin EC 81 MG EC tablet Take 1 tablet (81 mg total) by mouth daily. Patient not taking: Reported on 08/01/2019 07/31/14   Florencia Reasons, MD  potassium chloride SA  (K-DUR,KLOR-CON) 20 MEQ tablet Take 1 tablet (20 mEq total) by mouth daily. Patient not taking: Reported on 05/02/2016 08/10/14   Domenic Polite, MD    Physical Exam: Vitals:   08/01/19 1710 08/01/19 1730 08/01/19 1750 08/01/19 1801  BP: 98/67 116/74 113/80 107/71  Pulse:  100  (!) 110  Resp: 20 (!) 21 (!) 23 20  Temp:      TempSrc:      SpO2:  100%  100%      Constitutional: Drowsy due to medications. Elderly Frail.  Eyes: PERRL, lids and conjunctivae normal ENMT: Mucous membranes are Dry Posterior pharynx clear of any exudate or lesions.Normal dentition.  Neck: normal, supple, no masses, no thyromegaly Respiratory: Diffusely Diminished Breath Sounds Cardiovascular: Irregularly Irregular, Tachycardia. Systolic murmur.  Abdomen: Non Tender non distended.  Musculoskeletal: Muscle wasting.  Skin: no rashes, lesions, ulcers. No induration Neurologic:  Difficult to Assess.  Psychiatric: Difficult to assess.   Labs on Admission: I have personally reviewed following labs and imaging studies  CBC: Recent Labs  Lab 08/01/19 1225  WBC 6.4  NEUTROABS 3.9  HGB 11.4*  HCT 37.9  MCV 88.1  PLT 025   Basic Metabolic Panel: Recent Labs  Lab 08/01/19 1225  NA 133*  K 6.2*  CL 98  CO2 26  GLUCOSE 86  BUN 10  CREATININE 0.85  CALCIUM 8.7*   GFR: CrCl cannot be calculated (Unknown ideal weight.). Liver Function Tests: Recent Labs  Lab 08/01/19 1225  AST 41  ALT 13  ALKPHOS 60  BILITOT 1.4*  PROT 7.2  ALBUMIN 3.5   No results for input(s): LIPASE, AMYLASE in the last 168 hours. No results for input(s): AMMONIA in the last 168 hours. Coagulation Profile: No results for input(s): INR, PROTIME in the last 168 hours. Cardiac Enzymes: No results for input(s): CKTOTAL, CKMB, CKMBINDEX, TROPONINI in the last 168 hours. BNP (last 3 results) No results for input(s): PROBNP in the last 8760 hours. HbA1C: No results for input(s): HGBA1C in the last 72 hours. CBG: No  results for input(s): GLUCAP in the last 168 hours. Lipid Profile: No results for input(s): CHOL, HDL, LDLCALC, TRIG, CHOLHDL, LDLDIRECT in the last 72 hours. Thyroid Function Tests: No results for input(s): TSH, T4TOTAL, FREET4, T3FREE, THYROIDAB in the last 72 hours. Anemia Panel: No results for input(s): VITAMINB12, FOLATE, FERRITIN, TIBC, IRON, RETICCTPCT in the last 72 hours. Urine analysis:    Component Value Date/Time   COLORURINE YELLOW 08/01/2019 [place] 08/01/2019 1052   LABSPEC 1.015 08/01/2019 1052   PHURINE 5.0 08/01/2019 1052   GLUCOSEU NEGATIVE 08/01/2019 1052   HGBUR NEGATIVE 08/01/2019 [place] 08/01/2019 [place] 08/01/2019 1052   PROTEINUR NEGATIVE  08/01/2019 1052   UROBILINOGEN 0.2 08/05/2014 0415   NITRITE NEGATIVE 08/01/2019 1052   LEUKOCYTESUR NEGATIVE 08/01/2019 1052   Sepsis Labs: !!!!!!!!!!!!!!!!!!!!!!!!!!!!!!!!!!!!!!!!!!!! @LABRCNTIP (procalcitonin:4,lacticidven:4) ) Recent Results (from the past 240 hour(s))  SARS Coronavirus 2 by RT PCR (hospital order, performed in [facility] hospital lab) Nasopharyngeal Nasopharyngeal Swab     Status: None   Collection Time: 08/01/19 12:30 PM   Specimen: Nasopharyngeal Swab  Result Value Ref Range Status   SARS Coronavirus 2 NEGATIVE NEGATIVE Final    Comment: (NOTE) SARS-CoV-2 target nucleic acids are NOT DETECTED.  The SARS-CoV-2 RNA is generally detectable in upper and lower respiratory specimens during the acute phase of infection. The lowest concentration of SARS-CoV-2 viral copies this assay can detect is 250 copies / mL. A negative result does not preclude SARS-CoV-2 infection and should not be used as the sole basis for treatment or other patient management decisions.  A negative result may occur with improper specimen collection / handling, submission of specimen other than nasopharyngeal swab, presence of viral mutation(s) within the areas  targeted by this assay, and inadequate number of viral copies (<250 copies / mL). A negative result must be combined with clinical observations, patient history, and epidemiological information.  Fact Sheet for Patients:   StrictlyIdeas.no  Fact Sheet for Healthcare Providers: BankingDealers.co.za  This test is not yet approved or  cleared by the [place] FDA and has been authorized for detection and/or diagnosis of SARS-CoV-2 by FDA under an Emergency Use Authorization (EUA).  This EUA will remain in effect (meaning this test can be used) for the duration of the COVID-19 declaration under Section 564(b)(1) of the Act, 21 U.S.C. section 360bbb-3(b)(1), unless the authorization is terminated or revoked sooner.  Performed at [facility], [place] [address], [place], [place] [address]      Radiological Exams on Admission: DG Chest Port 1 View  Result Date: 08/01/2019 CLINICAL DATA:  Hypoxia. EXAM: PORTABLE CHEST 1 VIEW COMPARISON:  08/07/2014 FINDINGS: Examination demonstrates mild volume loss of the left lung with stable mild elevation of the left hemidiaphragm. Minimal left base opacification which is chronic/stable. Lungs are otherwise clear. Mild stable cardiomegaly. Remainder of the exam is unchanged. IMPRESSION: 1. No acute findings. Stable left basilar changes and mild stable volume loss of the left lung. 2.  Mild cardiomegaly. Electronically Signed   By: Marin Olp M.D.   On: 08/01/2019 11:33   All images have been reviewed by me personally.  EKG: Independently reviewed. A flutter, no acute t wave changes.   Assessment/Plan Principal Problem:   Acute respiratory failure with hypoxemia (HCC) Active Problems:   Dyspnea   Dementia (HCC)   Atrial flutter (HCC)   Acute hypoxemic respiratory failure (HCC)   Acute hypoxic and hypercarbic respiratory failure on 3 L nasal cannula -Admit for observation.   Unclear etiology.  Check BNP, procalcitonin -Chest x-ray shows cardiomegaly. -Supplemental oxygen as needed, supportive care. -Hold off on giving Lasix due to soft blood pressure.  Will need to control heart rate better. -Bronchodilators- Xopenex & Ipratropium.  Incentive spirometer and flutter valve  Atrial flutter with RVR -On home metoprolol 12.5 mg twice daily, due to drowsiness we will place her on Lopressor 5 mg IV as needed for heart rate greater than 110.  Monitor blood pressure.  Once she wakes up she can get her home oral medication.  If necessary we can give digoxin. -Not an anticoagulation candidate due to fall risk  Hyperkalemia -No EKG changes.  Repeat BMP  Dementia with behavioral disturbances -Status post Haldol and Ativan in the ER.  Patient is currently very drowsy, protecting her airway. -Accu-Cheks every 4 hours  Hx of Dysphagia -Previously was on Dys III diet, will place her on that for now.    DVT prophylaxis: Lovenox Code Status: Full Code Family Communication: Husband At Bedside Consults called: None Admission status: Obs admission to Tele  Status is: Observation  The patient remains OBS appropriate and will d/c before 2 midnights.  Dispo: The patient is from: Home              Anticipated d/c is to: Home              Anticipated d/c date is: 1 day              Patient currently is not medically stable to d/c. Hospital stay for Hypoxia & Heart Rate control.   Time Spent: 65 minutes.  >50% of the time was devoted to discussing the patients care, assessment, plan and disposition with other care givers along with counseling the patient about the risks and benefits of treatment.    [name] Arsenio Loader MD [facility]  If 7PM-7AM, please contact night-coverage   08/01/2019, 6:14 PM

## 2019-08-01 NOTE — ED Notes (Signed)
ED TO INPATIENT HANDOFF REPORT  Name/Age/Gender [name] 86 y.o. adult  Code Status    Code Status Orders  (From admission, onward)         Start     Ordered   08/01/19 1812  Full code  Continuous        08/01/19 1812        Code Status History    Date Active Date Inactive Code Status Order ID Comments User Context   08/05/2014 0533 08/10/2014 1442 Full Code [id number]  Corey Harold, NP ED   07/30/2014 0600 07/31/2014 1901 Full Code [id number]  Lavina Hamman, MD Inpatient   Advance Care Planning Activity      Home/SNF/Other Home  Chief Complaint Acute hypoxemic respiratory failure ([place]) [J96.01]  Level of Care/Admitting Diagnosis ED Disposition    ED Disposition Condition [facility]: [place] [[id number]]  Level of Care: Telemetry [5]  Admit to tele based on following criteria: Complex arrhythmia (Bradycardia/Tachycardia)  Covid Evaluation: Asymptomatic Screening Protocol (No Symptoms)  Diagnosis: Acute hypoxemic respiratory failure [facility]) [[id number]]  Admitting Physician: Damita Lack [[id number]]  Attending Physician: Gerlean Ren [facility] [[id number]]       Medical History Past Medical History:  Diagnosis Date   Allergy    CHF (congestive heart failure) ([place])    Dementia (H[facility])    Fibromyalgia    jt pains, Tramadol [place] 0-4 per day    Fractured hip ([place]) 10/2010   fractured left hip, L THR    IBS (irritable bowel syndrome)    IBS-constipation   On prednisone therapy 2008-2010   Continuous prednisone therapy    Palpitations    paroxysmal atrial tachycardia, EF normal, diastolic dysfunction-echo/Holter 10/10 (she did not wish to take medications prescribed, metoprolol/HCTZ)   PNA (pneumonia)    w/lung abscess at 36 months of age    Allergies Allergies  Allergen Reactions   Compazine [Prochlorperazine Edisylate]     could not talk or hold head up   Demerol [Meperidine]     itch, nasal cong    Dilaudid [Hydromorphone Hcl]     itch   Novocain [Procaine]     heart races   Other     "some antibiotic": muscle flexion Tetanus-Diphth-Acell Pertussis: left side numb    IV Location/Drains/Wounds Patient Lines/Drains/Airways Status    Active Line/Drains/Airways    Name Placement date Placement time Site Days   Peripheral IV 08/01/19 Left Hand 08/01/19  1220  Hand  less than 1   Peripheral IV 08/01/19 Right Hand 08/01/19  1822  Hand  less than 1          Labs/Imaging Results for orders placed or performed during the hospital encounter of 08/01/19 (from the past 73 hour(s))  Urinalysis, Routine w reflex microscopic     Status: None   Collection Time: 08/01/19 10:52 AM  Result Value Ref Range   Color, Urine YELLOW YELLOW   APPearance CLEAR CLEAR   Specific Gravity, Urine 1.015 1.005 - 1.030   pH 5.0 5.0 - 8.0   Glucose, UA NEGATIVE NEGATIVE mg/dL   Hgb urine dipstick NEGATIVE NEGATIVE   Bilirubin Urine NEGATIVE NEGATIVE   Ketones, ur NEGATIVE NEGATIVE mg/dL   Protein, ur NEGATIVE NEGATIVE mg/dL   Nitrite NEGATIVE NEGATIVE   Leukocytes,Ua NEGATIVE NEGATIVE    Comment: Performed at [facility], [place] [address], [place], [place] [address]  Comprehensive metabolic panel     Status: Abnormal  Collection Time: 08/01/19 12:25 PM  Result Value Ref Range   Sodium 133 (L) 135 - 145 mmol/L   Potassium 6.2 (H) 3.5 - 5.1 mmol/L   Chloride 98 98 - 111 mmol/L   CO2 26 22 - 32 mmol/L   Glucose, Bld 86 70 - 99 mg/dL    Comment: Glucose reference range applies only to samples taken after fasting for at least 8 hours.   BUN 10 8 - 23 mg/dL   Creatinine, Ser 0.85 0.44 - 1.00 mg/dL   Calcium 8.7 (L) 8.9 - 10.3 mg/dL   Total Protein 7.2 6.5 - 8.1 g/dL   Albumin 3.5 3.5 - 5.0 g/dL   AST 41 15 - 41 U/L   ALT 13 0 - 44 U/L   Alkaline Phosphatase 60 38 - 126 U/L   Total Bilirubin 1.4 (H) 0.3 - 1.2 mg/dL   GFR calc non Af Amer >60 >60 mL/min   GFR calc Af Amer >60  >60 mL/min   Anion gap 9 5 - 15    Comment: Performed at [facility], [place] [address] [place], [place] [address]  CBC with Differential     Status: Abnormal   Collection Time: 08/01/19 12:25 PM  Result Value Ref Range   WBC 6.4 4.0 - 10.5 K/uL   RBC 4.30 3.87 - 5.11 MIL/uL   Hemoglobin 11.4 (L) 12.0 - 15.0 g/dL   HCT 37.9 36 - 46 %   MCV 88.1 80.0 - 100.0 fL   MCH 26.5 26.0 - 34.0 pg   MCHC 30.1 30.0 - 36.0 g/dL   RDW 18.0 (H) 11.5 - 15.5 %   Platelets 280 150 - 400 K/uL   nRBC 0.0 0.0 - 0.2 %   Neutrophils Relative % 60 %   Neutro Abs 3.9 1.7 - 7.7 K/uL   Lymphocytes Relative 16 %   Lymphs Abs 1.0 0.7 - 4.0 K/uL   Monocytes Relative 12 %   Monocytes Absolute 0.8 0 - 1 K/uL   Eosinophils Relative 9 %   Eosinophils Absolute 0.6 (H) 0 - 0 K/uL   Basophils Relative 2 %   Basophils Absolute 0.1 0 - 0 K/uL   Immature Granulocytes 1 %   Abs Immature Granulocytes 0.04 0.00 - 0.07 K/uL    Comment: Performed at [facility], [place] [address] [place], [place] [address]  Blood gas, venous     Status: Abnormal   Collection Time: 08/01/19 12:30 PM  Result Value Ref Range   pH, Ven 7.301 7.25 - 7.43   pCO2, Ven 62.1 (H) 44 - 60 mmHg   pO2, Ven 30.5 (LL) 32 - 45 mmHg    Comment: CRITICAL RESULT CALLED TO, READ BACK BY AND VERIFIED WITH: M.MCKEBER,RN [id number] @1301  BY V.WILKINS    Bicarbonate 29.7 (H) 20.0 - 28.0 mmol/L   Acid-Base Excess 2.5 (H) 0.0 - 2.0 mmol/L   O2 Saturation 36.6 %   Patient temperature 98.6     Comment: Performed at [facility], [place] [address] [place], [place] [address]  Lactic acid, plasma     Status: None   Collection Time: 08/01/19 12:30 PM  Result Value Ref Range   Lactic Acid, Venous 1.8 0.5 - 1.9 mmol/L    Comment: Performed at [facility], [place] [address] [place], [place] [address]  SARS Coronavirus 2 by RT PCR (hospital order, performed in [facility] hospital lab) Nasopharyngeal  Nasopharyngeal Swab     Status:  None   Collection Time: 08/01/19 12:30 PM   Specimen: Nasopharyngeal Swab  Result Value Ref Range   SARS Coronavirus 2 NEGATIVE NEGATIVE    Comment: (NOTE) SARS-CoV-2 target nucleic acids are NOT DETECTED.  The SARS-CoV-2 RNA is generally detectable in upper and lower respiratory specimens during the acute phase of infection. The lowest concentration of SARS-CoV-2 viral copies this assay can detect is 250 copies / mL. A negative result does not preclude SARS-CoV-2 infection and should not be used as the sole basis for treatment or other patient management decisions.  A negative result may occur with improper specimen collection / handling, submission of specimen other than nasopharyngeal swab, presence of viral mutation(s) within the areas targeted by this assay, and inadequate number of viral copies (<250 copies / mL). A negative result must be combined with clinical observations, patient history, and epidemiological information.  Fact Sheet for Patients:   StrictlyIdeas.no  Fact Sheet for Healthcare Providers: BankingDealers.co.za  This test is not yet approved or  cleared by the [place] FDA and has been authorized for detection and/or diagnosis of SARS-CoV-2 by FDA under an Emergency Use Authorization (EUA).  This EUA will remain in effect (meaning this test can be used) for the duration of the COVID-19 declaration under Section 564(b)(1) of the Act, 21 U.S.C. section 360bbb-3(b)(1), unless the authorization is terminated or revoked sooner.  Performed at [facility], [place] [address], [place], [place] [address]   Lactic acid, plasma     Status: None   Collection Time: 08/01/19  6:23 PM  Result Value Ref Range   Lactic Acid, Venous 1.5 0.5 - 1.9 mmol/L    Comment: Performed at [facility], [place] [address] [place], [place] [address]  Brain natriuretic  peptide     Status: Abnormal   Collection Time: 08/01/19  6:23 PM  Result Value Ref Range   B Natriuretic Peptide 413.3 (H) 0.0 - 100.0 pg/mL    Comment: Performed at [facility], [place] [address], [place], [place] [address]  Procalcitonin - Baseline     Status: None   Collection Time: 08/01/19  6:23 PM  Result Value Ref Range   Procalcitonin <0.10 ng/mL    Comment:        Interpretation: PCT (Procalcitonin) <= 0.5 ng/mL: Systemic infection (sepsis) is not likely. Local bacterial infection is possible. (NOTE)       Sepsis PCT Algorithm           Lower Respiratory Tract                                      Infection PCT Algorithm    ----------------------------     ----------------------------         PCT < 0.25 ng/mL                PCT < 0.10 ng/mL          Strongly encourage             Strongly discourage   discontinuation of antibiotics    initiation of antibiotics    ----------------------------     -----------------------------       PCT 0.25 - 0.50 ng/mL            PCT 0.10 - 0.25 ng/mL               OR       >  80% decrease in PCT            Discourage initiation of                                            antibiotics      Encourage discontinuation           of antibiotics    ----------------------------     -----------------------------         PCT >= 0.50 ng/mL              PCT 0.26 - 0.50 ng/mL               AND        <80% decrease in PCT             Encourage initiation of                                             antibiotics       Encourage continuation           of antibiotics    ----------------------------     -----------------------------        PCT >= 0.50 ng/mL                  PCT > 0.50 ng/mL               AND         increase in PCT                  Strongly encourage                                      initiation of antibiotics    Strongly encourage escalation           of antibiotics                                      -----------------------------                                           PCT <= 0.25 ng/mL                                                 OR                                        > 80% decrease in PCT                                      Discontinue / Do not initiate  antibiotics  Performed at [facility], [place] [address] [place], [place] [address]   Basic metabolic panel     Status: Abnormal   Collection Time: 08/01/19  6:23 PM  Result Value Ref Range   Sodium 133 (L) 135 - 145 mmol/L   Potassium 5.4 (H) 3.5 - 5.1 mmol/L   Chloride 102 98 - 111 mmol/L   CO2 21 (L) 22 - 32 mmol/L   Glucose, Bld 95 70 - 99 mg/dL    Comment: Glucose reference range applies only to samples taken after fasting for at least 8 hours.   BUN 12 8 - 23 mg/dL   Creatinine, Ser 0.81 0.44 - 1.00 mg/dL   Calcium 8.5 (L) 8.9 - 10.3 mg/dL   GFR calc non Af Amer >60 >60 mL/min   GFR calc Af Amer >60 >60 mL/min   Anion gap 10 5 - 15    Comment: Performed at [facility], [place] [address], [place], [place] [address]  TSH     Status: None   Collection Time: 08/01/19  6:23 PM  Result Value Ref Range   TSH 1.554 0.350 - 4.500 uIU/mL    Comment: Performed by a 3rd Generation assay with a functional sensitivity of <=0.01 uIU/mL. Performed at [facility], [place] [address], [place], [place] [address]   CBC     Status: Abnormal   Collection Time: 08/01/19  6:23 PM  Result Value Ref Range   WBC 8.8 4.0 - 10.5 K/uL   RBC 4.08 3.87 - 5.11 MIL/uL   Hemoglobin 10.9 (L) 12.0 - 15.0 g/dL   HCT 35.9 (L) 36 - 46 %   MCV 88.0 80.0 - 100.0 fL   MCH 26.7 26.0 - 34.0 pg   MCHC 30.4 30.0 - 36.0 g/dL   RDW 18.0 (H) 11.5 - 15.5 %   Platelets 342 150 - 400 K/uL   nRBC 0.0 0.0 - 0.2 %    Comment: Performed at [facility], [place] [address] [place], [place] [address]  Creatinine, serum     Status: None    Collection Time: 08/01/19  6:23 PM  Result Value Ref Range   Creatinine, Ser 0.84 0.44 - 1.00 mg/dL   GFR calc non Af Amer >60 >60 mL/min   GFR calc Af Amer >60 >60 mL/min    Comment: Performed at [facility], [place] [address], [place],  [address]  CBG monitoring, ED     Status: None   Collection Time: 08/01/19  6:23 PM  Result Value Ref Range   Glucose-Capillary 99 70 - 99 mg/dL    Comment: Glucose reference range applies only to samples taken after fasting for at least 8 hours.   DG Chest Port 1 View  Result Date: 08/01/2019 CLINICAL DATA:  Hypoxia. EXAM: PORTABLE CHEST 1 VIEW COMPARISON:  08/07/2014 FINDINGS: Examination demonstrates mild volume loss of the left lung with stable mild elevation of the left hemidiaphragm. Minimal left base opacification which is chronic/stable. Lungs are otherwise clear. Mild stable cardiomegaly. Remainder of the exam is unchanged. IMPRESSION: 1. No acute findings. Stable left basilar changes and mild stable volume loss of the left lung. 2.  Mild cardiomegaly. Electronically Signed   By: Marin Olp M.D.   On: 08/01/2019 11:33    Pending Labs Unresulted Labs (From admission, onward) Comment          Start     Ordered   08/08/19 0500  Creatinine,  serum  (enoxaparin (LOVENOX)    CrCl >/= 30 ml/min)  Weekly,   R     Comments: while on enoxaparin therapy    08/01/19 1812   08/02/19 6546  Basic metabolic panel  Daily,   R     Question:  Specimen collection method  Answer:  Lab=Lab collect   08/01/19 1727   08/02/19 0500  CBC  Daily,   R     Question:  Specimen collection method  Answer:  Lab=Lab collect   08/01/19 1727   08/02/19 0500  Magnesium  Daily,   R     Question:  Specimen collection method  Answer:  Lab=Lab collect   08/01/19 1727   08/01/19 1052  Culture, blood (routine x 2)  BLOOD CULTURE X 2,   STAT      08/01/19 1053          Vitals/Pain Today's Vitals   08/01/19 1900 08/01/19 1927 08/01/19 2010 08/01/19  2037  BP: 112/63  (!) 151/90   Pulse: 74 78    Resp: 15 18 (!) 22   Temp:      TempSrc:      SpO2: 100% 96%  94%    Isolation Precautions No active isolations  Medications Medications  metoprolol tartrate (LOPRESSOR) injection 5 mg (has no administration in time range)  metoprolol tartrate (LOPRESSOR) tablet 12.5 mg (0 mg Oral Hold 08/01/19 1757)  senna-docusate (Senokot-S) tablet 2 tablet (has no administration in time range)  polyethylene glycol (MIRALAX / GLYCOLAX) packet 17 g (has no administration in time range)  ipratropium (ATROVENT) nebulizer solution 0.5 mg (0.5 mg Nebulization Given 08/01/19 2037)  levalbuterol (XOPENEX) nebulizer solution 1.25 mg (1.25 mg Nebulization Given 08/01/19 2037)  enoxaparin (LOVENOX) injection 40 mg (has no administration in time range)  acetaminophen (TYLENOL) tablet 650 mg (has no administration in time range)    Or  acetaminophen (TYLENOL) suppository 650 mg (has no administration in time range)  senna-docusate (Senokot-S) tablet 1 tablet (has no administration in time range)  ondansetron (ZOFRAN) tablet 4 mg (has no administration in time range)    Or  ondansetron (ZOFRAN) injection 4 mg (has no administration in time range)  sodium chloride 0.9 % bolus 500 mL (0 mLs Intravenous Stopped 08/01/19 1355)  haloperidol lactate (HALDOL) injection 2 mg (2 mg Intravenous Given 08/01/19 1357)  LORazepam (ATIVAN) injection 1 mg (1 mg Intravenous Given 08/01/19 1523)  metoprolol tartrate (LOPRESSOR) injection 5 mg (2.5 mg Intravenous Given 08/01/19 1642)  sodium chloride 0.9 % bolus 250 mL (0 mLs Intravenous Stopped 08/01/19 1710)    Mobility non-ambulatory

## 2019-08-01 NOTE — ED Triage Notes (Signed)
Patient spouse call ems with c/o patient unable to sleep and nausea. Patient have not take her medication in several weeks. Per ems pt had intermittent Afib 60-144. Patient is HOH and dementia.

## 2019-08-02 ENCOUNTER — Inpatient Hospital Stay (HOSPITAL_COMMUNITY): Payer: Medicare Other

## 2019-08-02 DIAGNOSIS — J9601 Acute respiratory failure with hypoxia: Secondary | ICD-10-CM | POA: Diagnosis not present

## 2019-08-02 DIAGNOSIS — I959 Hypotension, unspecified: Secondary | ICD-10-CM | POA: Diagnosis not present

## 2019-08-02 DIAGNOSIS — R1310 Dysphagia, unspecified: Secondary | ICD-10-CM | POA: Diagnosis present

## 2019-08-02 DIAGNOSIS — I110 Hypertensive heart disease with heart failure: Secondary | ICD-10-CM | POA: Diagnosis not present

## 2019-08-02 DIAGNOSIS — Z66 Do not resuscitate: Secondary | ICD-10-CM | POA: Diagnosis not present

## 2019-08-02 DIAGNOSIS — Z87891 Personal history of nicotine dependence: Secondary | ICD-10-CM | POA: Diagnosis not present

## 2019-08-02 DIAGNOSIS — Z7982 Long term (current) use of aspirin: Secondary | ICD-10-CM | POA: Diagnosis not present

## 2019-08-02 DIAGNOSIS — Z8249 Family history of ischemic heart disease and other diseases of the circulatory system: Secondary | ICD-10-CM | POA: Diagnosis not present

## 2019-08-02 DIAGNOSIS — I5021 Acute systolic (congestive) heart failure: Secondary | ICD-10-CM | POA: Diagnosis not present

## 2019-08-02 DIAGNOSIS — K589 Irritable bowel syndrome without diarrhea: Secondary | ICD-10-CM | POA: Diagnosis present

## 2019-08-02 DIAGNOSIS — H9190 Unspecified hearing loss, unspecified ear: Secondary | ICD-10-CM | POA: Diagnosis present

## 2019-08-02 DIAGNOSIS — Z515 Encounter for palliative care: Secondary | ICD-10-CM | POA: Diagnosis not present

## 2019-08-02 DIAGNOSIS — I340 Nonrheumatic mitral (valve) insufficiency: Secondary | ICD-10-CM

## 2019-08-02 DIAGNOSIS — Z681 Body mass index (BMI) 19 or less, adult: Secondary | ICD-10-CM | POA: Diagnosis not present

## 2019-08-02 DIAGNOSIS — E43 Unspecified severe protein-calorie malnutrition: Secondary | ICD-10-CM | POA: Diagnosis not present

## 2019-08-02 DIAGNOSIS — I361 Nonrheumatic tricuspid (valve) insufficiency: Secondary | ICD-10-CM | POA: Diagnosis not present

## 2019-08-02 DIAGNOSIS — Z9114 Patient's other noncompliance with medication regimen: Secondary | ICD-10-CM | POA: Diagnosis present

## 2019-08-02 DIAGNOSIS — R0602 Shortness of breath: Secondary | ICD-10-CM | POA: Diagnosis not present

## 2019-08-02 DIAGNOSIS — E875 Hyperkalemia: Secondary | ICD-10-CM | POA: Diagnosis present

## 2019-08-02 DIAGNOSIS — R404 Transient alteration of awareness: Secondary | ICD-10-CM | POA: Diagnosis not present

## 2019-08-02 DIAGNOSIS — F0391 Unspecified dementia with behavioral disturbance: Secondary | ICD-10-CM | POA: Diagnosis not present

## 2019-08-02 DIAGNOSIS — F0281 Dementia in other diseases classified elsewhere with behavioral disturbance: Secondary | ICD-10-CM | POA: Diagnosis not present

## 2019-08-02 DIAGNOSIS — I471 Supraventricular tachycardia: Secondary | ICD-10-CM | POA: Diagnosis not present

## 2019-08-02 DIAGNOSIS — Z79899 Other long term (current) drug therapy: Secondary | ICD-10-CM | POA: Diagnosis not present

## 2019-08-02 DIAGNOSIS — Z20822 Contact with and (suspected) exposure to covid-19: Secondary | ICD-10-CM | POA: Diagnosis not present

## 2019-08-02 DIAGNOSIS — I350 Nonrheumatic aortic (valve) stenosis: Secondary | ICD-10-CM | POA: Diagnosis present

## 2019-08-02 DIAGNOSIS — I4811 Longstanding persistent atrial fibrillation: Secondary | ICD-10-CM | POA: Diagnosis not present

## 2019-08-02 DIAGNOSIS — I483 Typical atrial flutter: Secondary | ICD-10-CM | POA: Diagnosis not present

## 2019-08-02 DIAGNOSIS — Z9049 Acquired absence of other specified parts of digestive tract: Secondary | ICD-10-CM | POA: Diagnosis not present

## 2019-08-02 DIAGNOSIS — J9602 Acute respiratory failure with hypercapnia: Secondary | ICD-10-CM | POA: Diagnosis not present

## 2019-08-02 DIAGNOSIS — M2550 Pain in unspecified joint: Secondary | ICD-10-CM | POA: Diagnosis not present

## 2019-08-02 DIAGNOSIS — I5020 Unspecified systolic (congestive) heart failure: Secondary | ICD-10-CM | POA: Diagnosis not present

## 2019-08-02 DIAGNOSIS — I4892 Unspecified atrial flutter: Secondary | ICD-10-CM | POA: Diagnosis not present

## 2019-08-02 DIAGNOSIS — G4700 Insomnia, unspecified: Secondary | ICD-10-CM | POA: Diagnosis present

## 2019-08-02 DIAGNOSIS — F0280 Dementia in other diseases classified elsewhere without behavioral disturbance: Secondary | ICD-10-CM | POA: Diagnosis not present

## 2019-08-02 DIAGNOSIS — Z7401 Bed confinement status: Secondary | ICD-10-CM | POA: Diagnosis not present

## 2019-08-02 LAB — CBC
HCT: 38.8 % (ref 36.0–46.0)
Hemoglobin: 11.4 g/dL — ABNORMAL LOW (ref 12.0–15.0)
MCH: 26.1 pg (ref 26.0–34.0)
MCHC: 29.4 g/dL — ABNORMAL LOW (ref 30.0–36.0)
MCV: 89 fL (ref 80.0–100.0)
Platelets: 255 10*3/uL (ref 150–400)
RBC: 4.36 MIL/uL (ref 3.87–5.11)
RDW: 17.7 % — ABNORMAL HIGH (ref 11.5–15.5)
WBC: 6.1 10*3/uL (ref 4.0–10.5)
nRBC: 0 % (ref 0.0–0.2)

## 2019-08-02 LAB — BASIC METABOLIC PANEL
Anion gap: 9 (ref 5–15)
BUN: 13 mg/dL (ref 8–23)
CO2: 23 mmol/L (ref 22–32)
Calcium: 8.4 mg/dL — ABNORMAL LOW (ref 8.9–10.3)
Chloride: 101 mmol/L (ref 98–111)
Creatinine, Ser: 0.76 mg/dL (ref 0.44–1.00)
GFR calc Af Amer: >60 mL/min (ref >60)
GFR calc non Af Amer: >60 mL/min (ref >60)
Glucose, Bld: 83 mg/dL (ref 70–99)
Potassium: 4.6 mmol/L (ref 3.5–5.1)
Sodium: 133 mmol/L — ABNORMAL LOW (ref 135–145)

## 2019-08-02 LAB — GLUCOSE, CAPILLARY
Glucose-Capillary: 100 mg/dL — ABNORMAL HIGH (ref 70–99)
Glucose-Capillary: 158 mg/dL — ABNORMAL HIGH (ref 70–99)
Glucose-Capillary: 43 mg/dL — CL (ref 70–99)
Glucose-Capillary: 78 mg/dL (ref 70–99)
Glucose-Capillary: 79 mg/dL (ref 70–99)
Glucose-Capillary: 79 mg/dL (ref 70–99)
Glucose-Capillary: 81 mg/dL (ref 70–99)
Glucose-Capillary: 88 mg/dL (ref 70–99)

## 2019-08-02 LAB — ECHOCARDIOGRAM COMPLETE: Weight: 1830.17 oz

## 2019-08-02 LAB — MAGNESIUM: Magnesium: 2 mg/dL (ref 1.7–2.4)

## 2019-08-02 MED ORDER — DEXTROSE 50 % IV SOLN
INTRAVENOUS | Status: AC
  Administered 2019-08-02: 50 mL
  Filled 2019-08-02: qty 50

## 2019-08-02 MED ORDER — GLUCAGON HCL RDNA (DIAGNOSTIC) 1 MG IJ SOLR
1.0000 mg | Freq: Once | INTRAMUSCULAR | Status: AC
  Administered 2019-08-04: 1 mg via INTRAVENOUS

## 2019-08-02 NOTE — Progress Notes (Signed)
PROGRESS NOTE    [name]  RKY:[id number] DOB: 20-Sep-1930 DOA: 08/01/2019 PCP: Lawerance Cruel, MD   Brief Narrative:  HPI: [name] is a 86 y.o. adult with medical history significant of Dementia, Osteoarthritis, IBS, HLD, P A fib not on AC brought to the hospital for nausea, insomnia and shortness of breath.  Admitted for hypercarbic/hypoxic respiratory failure secondary to CHF exacerbation complicated by a flutter with RVR.   Assessment & Plan:   Principal Problem:   Acute respiratory failure with hypoxemia (HCC) Active Problems:   Dyspnea   Dementia (HCC)   Atrial flutter (HCC)   Acute hypoxemic respiratory failure (HCC)  Acute hypoxic and hypercarbic respiratory failure on 3 L nasal cannula Acute congestive heart failure preserved ejection fraction, EF 65%.  Class III BNP elevated, pro-Cal negative Needs better rate control and stable blood pressure before starting diuretics. Chest x-ray shows cardiomegaly Echo-pending Supplemental oxygen and supportive care. Bronchodilators- Xopenex & Ipratropium.  Incentive spirometer and flutter valve  Atrial flutter with RVR Resume home metoprolol 12.5 mg twice daily.  Lopressor 5 mg IV as needed. -Not an anticoagulation candidate due to fall risk  Hx of Dysphagia with hypoglycemic episode Avoiding IV fluids in the setting of hypoxia.  Amp D50 given, will give IV glucagon.  Hyperkalemia Resolved.  Dementia with behavioral disturbances -Status post Haldol and Ativan in the ER.  Patient is currently very drowsy, protecting her airway. -Accu-Cheks every 4 hours    DVT prophylaxis: enoxaparin (LOVENOX) injection 40 mg Start: 08/01/19 2330 Code Status: Full code Family Communication: Called Joneen Boers and Legrand Como - no answer therefore left a voicemail  Status is: Inpatient    Dispo: The patient is from: Home              Anticipated d/c is to: Home              Anticipated d/c date is: 2 days               Patient currently is not medically stable to d/c.  Heart rate is still intermittently greater than 110, poor oral intake with hypoglycemia.  Unstable for discharge.  Eventually she will also need PT OT evaluation    Body mass index is 20.92 kg/m.    Subjective: Little more awake.  Had hypoglycemic episode this morning with blood glucose down to 43 resolved with amp of D50. Awake, pleasantly confused. No complaints.   Review of Systems Otherwise negative except as per HPI, including: General = no fevers, chills, dizziness,  fatigue HEENT/EYES = negative for loss of vision, double vision, blurred vision,  sore throa Cardiovascular= negative for chest pain, palpitation Respiratory/lungs= negative for shortness of breath, cough, wheezing; hemoptysis,  Gastrointestinal= negative for nausea, vomiting, abdominal pain Genitourinary= negative for Dysuria MSK = Negative for arthralgia, myalgias Neurology= Negative for headache, numbness, tingling  Psychiatry= Negative for suicidal and homocidal ideation Skin= Negative for Rash   Examination: Constitutional: Not in acute distress; chronically ill and frail Respiratory: bibasilar crackles.  Cardiovascular: Irregularly irregular.  Abdomen: Nontender nondistended good bowel sounds Musculoskeletal: No edema noted Skin: No rashes seen Neurologic: CN 2-12 grossly intact.  And nonfocal Psychiatric: Poor judgement and insight. Alert to name only.   Objective: Vitals:   08/02/19 0532 08/02/19 0840 08/02/19 0906 08/02/19 0938  BP: 108/61 111/84 (!) 120/91 104/74  Pulse: 79 (!) 118 (!) 107 75  Resp: 20 20    Temp: 97.7 F (36.5 C) 97.6 F (36.4 C)  TempSrc: Oral Oral    SpO2: 100% (!) 78% 97%   Weight:        Intake/Output Summary (Last 24 hours) at 08/02/2019 0953 Last data filed at 08/01/2019 1710 Gross per 24 hour  Intake 250 ml  Output --  Net 250 ml   Filed Weights   08/01/19 2144 08/02/19 0500  Weight: 51.7 kg 51.9 kg      Data Reviewed:   CBC: Recent Labs  Lab 08/01/19 1225 08/01/19 1823 08/02/19 0436  WBC 6.4 8.8 6.1  NEUTROABS 3.9  --   --   HGB 11.4* 10.9* 11.4*  HCT 37.9 35.9* 38.8  MCV 88.1 88.0 89.0  PLT 280 342 956   Basic Metabolic Panel: Recent Labs  Lab 08/01/19 1225 08/01/19 1823 08/02/19 0436  NA 133* 133* 133*  K 6.2* 5.4* 4.6  CL 98 102 101  CO2 26 21* 23  GLUCOSE 86 95 83  BUN 10 12 13   CREATININE 0.85 0.81  0.84 0.76  CALCIUM 8.7* 8.5* 8.4*  MG  --   --  2.0   GFR: CrCl cannot be calculated (Unknown ideal weight.). Liver Function Tests: Recent Labs  Lab 08/01/19 1225  AST 41  ALT 13  ALKPHOS 60  BILITOT 1.4*  PROT 7.2  ALBUMIN 3.5   No results for input(s): LIPASE, AMYLASE in the last 168 hours. No results for input(s): AMMONIA in the last 168 hours. Coagulation Profile: No results for input(s): INR, PROTIME in the last 168 hours. Cardiac Enzymes: No results for input(s): CKTOTAL, CKMB, CKMBINDEX, TROPONINI in the last 168 hours. BNP (last 3 results) No results for input(s): PROBNP in the last 8760 hours. HbA1C: No results for input(s): HGBA1C in the last 72 hours. CBG: Recent Labs  Lab 08/01/19 2151 08/02/19 0007 08/02/19 0355 08/02/19 0757 08/02/19 0830  GLUCAP 80 78 79 43* 158*   Lipid Profile: No results for input(s): CHOL, HDL, LDLCALC, TRIG, CHOLHDL, LDLDIRECT in the last 72 hours. Thyroid Function Tests: Recent Labs    08/01/19 1823  TSH 1.554   Anemia Panel: No results for input(s): VITAMINB12, FOLATE, FERRITIN, TIBC, IRON, RETICCTPCT in the last 72 hours. Sepsis Labs: Recent Labs  Lab 08/01/19 1230 08/01/19 1823  PROCALCITON  --  <0.10  LATICACIDVEN 1.8 1.5    Recent Results (from the past 240 hour(s))  Culture, blood (routine x 2)     Status: None (Preliminary result)   Collection Time: 08/01/19 10:57 AM   Specimen: BLOOD  Result Value Ref Range Status   Specimen Description   Final    BLOOD RIGHT  ANTECUBITAL Performed at [facility] [address] [place], [place] [address]    Special Requests   Final    BOTTLES DRAWN AEROBIC AND ANAEROBIC Blood Culture results may not be optimal due to an inadequate volume of blood received in culture bottles Performed at [facility] [address], [place], [place] [address]    Culture   Final    NO GROWTH < 24 HOURS Performed at [facility] [address], [place], [place] [address]    Report Status PENDING  Incomplete  Culture, blood (routine x 2)     Status: None (Preliminary result)   Collection Time: 08/01/19 12:30 PM   Specimen: BLOOD  Result Value Ref Range Status   Specimen Description   Final    BLOOD LEFT HAND Performed at [facility] [address], [place], [place] [address]  Special Requests   Final    BOTTLES DRAWN AEROBIC AND ANAEROBIC Blood Culture adequate volume Performed at [facility] [address], [place], [place] [address]    Culture   Final    NO GROWTH < 24 HOURS Performed at [facility] [address] [place], [place] [address]    Report Status PENDING  Incomplete  SARS Coronavirus 2 by RT PCR (hospital order, performed in [facility] hospital lab) Nasopharyngeal Nasopharyngeal Swab     Status: None   Collection Time: 08/01/19 12:30 PM   Specimen: Nasopharyngeal Swab  Result Value Ref Range Status   SARS Coronavirus 2 NEGATIVE NEGATIVE Final    Comment: (NOTE) SARS-CoV-2 target nucleic acids are NOT DETECTED.  The SARS-CoV-2 RNA is generally detectable in upper and lower respiratory specimens during the acute phase of infection. The lowest concentration of SARS-CoV-2 viral copies this assay can detect is 250 copies / mL. A negative result does not preclude SARS-CoV-2 infection and should not be used as the sole basis for treatment or other patient management decisions.  A negative result may occur  with improper specimen collection / handling, submission of specimen other than nasopharyngeal swab, presence of viral mutation(s) within the areas targeted by this assay, and inadequate number of viral copies (<250 copies / mL). A negative result must be combined with clinical observations, patient history, and epidemiological information.  Fact Sheet for Patients:   StrictlyIdeas.no  Fact Sheet for Healthcare Providers: BankingDealers.co.za  This test is not yet approved or  cleared by the [place] FDA and has been authorized for detection and/or diagnosis of SARS-CoV-2 by FDA under an Emergency Use Authorization (EUA).  This EUA will remain in effect (meaning this test can be used) for the duration of the COVID-19 declaration under Section 564(b)(1) of the Act, 21 U.S.C. section 360bbb-3(b)(1), unless the authorization is terminated or revoked sooner.  Performed at [facility], [place] [address] [place], [place] [address]          Radiology Studies: [facility] Chest Port 1 View  Result Date: 08/01/2019 CLINICAL DATA:  Hypoxia. EXAM: PORTABLE CHEST 1 VIEW COMPARISON:  08/07/2014 FINDINGS: Examination demonstrates mild volume loss of the left lung with stable mild elevation of the left hemidiaphragm. Minimal left base opacification which is chronic/stable. Lungs are otherwise clear. Mild stable cardiomegaly. Remainder of the exam is unchanged. IMPRESSION: 1. No acute findings. Stable left basilar changes and mild stable volume loss of the left lung. 2.  Mild cardiomegaly. Electronically Signed   By: Marin Olp M.D.   On: 08/01/2019 11:33        Scheduled Meds: . enoxaparin (LOVENOX) injection  40 mg Subcutaneous Q24H  . glucagon (human recombinant)  1 mg Intravenous Once  . ipratropium  0.5 mg Nebulization BID  . levalbuterol  1.25 mg Nebulization BID  . metoprolol tartrate  12.5 mg Oral BID   Continuous  Infusions:   LOS: 0 days   Time spent= 35 mins    Myriah Boggus Arsenio Loader, MD [facility]  If 7PM-7AM, please contact night-coverage  08/02/2019, 9:53 AM

## 2019-08-02 NOTE — Progress Notes (Signed)
Hypoglycemic Event  CBG: 43  Treatment: D50 50 mL (25 gm)   Symptoms: Pale, difficult to arouse, altered mental status  Follow-up CBG: Time: 0830 CBG Result: 158  Possible Reasons for Event: Inadequate meal intake  Comments/MD notified:MD notified via page    Eustace Pen

## 2019-08-02 NOTE — Progress Notes (Signed)
Attempted to contact spouse Joneen Boers. Attempt unsuccessful. Phone rang multiple times with no answer. Attempted to contact son Legrand Como. Attempt unsuccessful. Left generic message on voicemail requesting a return phone call. Will advise family about patient's status when able to speak with one of them.

## 2019-08-02 NOTE — Progress Notes (Signed)
Pt HR reading up to 130s on tele monitoring, RN assess VS at bedside and apical pulse, electrodes replaced. HR did not read above 102. Will continue to monitor HR.

## 2019-08-02 NOTE — Progress Notes (Signed)
Pt refusing all PO. On call paged, RN informed to give PRN metoprolol if HR falls within ordered parameters. Will continue to monitor.

## 2019-08-02 NOTE — Progress Notes (Signed)
   08/02/19 0906  Assess: MEWS Score  BP (!) 120/91  Pulse Rate (!) 107  Level of Consciousness Alert  SpO2 97 %  O2 Device Nasal Cannula  O2 Flow Rate (L/min) 3 L/min  Assess: MEWS Score  MEWS Temp 0  MEWS Systolic 0  MEWS Pulse 1  MEWS RR 0  MEWS LOC 0  MEWS Score 1  MEWS Score Color Green   Per MD instruction, if pulse > 110 and BP is normal, ok to administer Lopressor 5 mg IV. Pulse has decreased to 107. Will continue to monitor as YELLOW MEWS guidelines are in place.

## 2019-08-02 NOTE — Progress Notes (Signed)
   08/02/19 0840  Assess: MEWS Score  Temp 97.6 F (36.4 C)  BP 111/84  Pulse Rate (!) 118  Resp 20  SpO2 (!) 78 %  Assess: MEWS Score  MEWS Temp 0  MEWS Systolic 0  MEWS Pulse 2  MEWS RR 0  MEWS LOC 0  MEWS Score 2  MEWS Score Color Yellow  Assess: if the MEWS score is Yellow or Red  Were vital signs taken at a resting state? Yes  Focused Assessment Documented focused assessment  Early Detection of Sepsis Score *See Row Information* Low  MEWS guidelines implemented *See Row Information* Yes  Treat  MEWS Interventions Administered scheduled meds/treatments;Escalated (See documentation below)  Take Vital Signs  Increase Vital Sign Frequency  Yellow: Q 2hr X 2 then Q 4hr X 2, if remains yellow, continue Q 4hrs  Escalate  MEWS: Escalate Yellow: discuss with charge nurse/RN and consider discussing with provider and RRT  Notify: Charge Nurse/RN  Name of Charge Nurse/RN Notified Mitchell Heir  Date Charge Nurse/RN Notified 08/02/19  Time Charge Nurse/RN Notified 0840  Notify: Provider  Provider Name/Title Dr Reesa Chew  Date Provider Notified 08/02/19  Time Provider Notified [phone]  Notification Type Page  Response Other (Comment)    Patient is in YELLOW MEWS due to elevated pulse. Will continue to monitor.

## 2019-08-02 NOTE — Progress Notes (Signed)
  Echocardiogram 2D Echocardiogram has been performed.  [name] 08/02/2019, 12:25 PM

## 2019-08-02 NOTE — Progress Notes (Signed)
Spouse's cell phone number is [phone].

## 2019-08-03 LAB — CBC
HCT: 36.8 % (ref 36.0–46.0)
Hemoglobin: 11.2 g/dL — ABNORMAL LOW (ref 12.0–15.0)
MCH: 26.4 pg (ref 26.0–34.0)
MCHC: 30.4 g/dL (ref 30.0–36.0)
MCV: 86.6 fL (ref 80.0–100.0)
Platelets: 263 10*3/uL (ref 150–400)
RBC: 4.25 MIL/uL (ref 3.87–5.11)
RDW: 17.5 % — ABNORMAL HIGH (ref 11.5–15.5)
WBC: 7.2 10*3/uL (ref 4.0–10.5)
nRBC: 0 % (ref 0.0–0.2)

## 2019-08-03 LAB — GLUCOSE, CAPILLARY
Glucose-Capillary: 80 mg/dL (ref 70–99)
Glucose-Capillary: 101 mg/dL — ABNORMAL HIGH (ref 70–99)
Glucose-Capillary: 84 mg/dL (ref 70–99)
Glucose-Capillary: 117 mg/dL — ABNORMAL HIGH (ref 70–99)
Glucose-Capillary: 91 mg/dL (ref 70–99)

## 2019-08-03 LAB — BASIC METABOLIC PANEL
Anion gap: 11 (ref 5–15)
BUN: 14 mg/dL (ref 8–23)
CO2: 24 mmol/L (ref 22–32)
Calcium: 8.7 mg/dL — ABNORMAL LOW (ref 8.9–10.3)
Chloride: 99 mmol/L (ref 98–111)
Creatinine, Ser: 0.76 mg/dL (ref 0.44–1.00)
GFR calc Af Amer: >60 mL/min (ref >60)
GFR calc non Af Amer: >60 mL/min (ref >60)
Glucose, Bld: 87 mg/dL (ref 70–99)
Potassium: 4.7 mmol/L (ref 3.5–5.1)
Sodium: 134 mmol/L — ABNORMAL LOW (ref 135–145)

## 2019-08-03 LAB — MAGNESIUM: Magnesium: 2 mg/dL (ref 1.7–2.4)

## 2019-08-03 MED ORDER — LORAZEPAM 2 MG/ML IJ SOLN
2.0000 mg | Freq: Once | INTRAMUSCULAR | Status: AC
  Administered 2019-08-03: 2 mg via INTRAVENOUS
  Filled 2019-08-03: qty 1

## 2019-08-03 MED ORDER — ASPIRIN EC 81 MG PO TBEC
81.0000 mg | DELAYED_RELEASE_TABLET | Freq: Every day | ORAL | Status: DC
  Administered 2019-08-07: 81 mg via ORAL
  Filled 2019-08-03 (×5): qty 1

## 2019-08-03 MED ORDER — HALOPERIDOL LACTATE 5 MG/ML IJ SOLN
2.0000 mg | Freq: Four times a day (QID) | INTRAMUSCULAR | Status: DC | PRN
  Administered 2019-08-03: 2 mg via INTRAVENOUS
  Filled 2019-08-03: qty 1

## 2019-08-03 MED ORDER — METOPROLOL TARTRATE 5 MG/5ML IV SOLN
5.0000 mg | Freq: Once | INTRAVENOUS | Status: AC
  Administered 2019-08-03: 5 mg via INTRAVENOUS

## 2019-08-03 MED ORDER — FUROSEMIDE 10 MG/ML IJ SOLN
20.0000 mg | Freq: Two times a day (BID) | INTRAMUSCULAR | Status: DC
  Administered 2019-08-03 – 2019-08-04 (×3): 20 mg via INTRAVENOUS
  Filled 2019-08-03 (×4): qty 2

## 2019-08-03 MED ORDER — LOSARTAN POTASSIUM 25 MG PO TABS
12.5000 mg | ORAL_TABLET | Freq: Every day | ORAL | Status: DC
  Administered 2019-08-07: 12.5 mg via ORAL
  Filled 2019-08-03 (×7): qty 0.5

## 2019-08-03 MED ORDER — HALOPERIDOL LACTATE 5 MG/ML IJ SOLN
4.0000 mg | Freq: Four times a day (QID) | INTRAMUSCULAR | Status: AC | PRN
  Administered 2019-08-03: 2 mg via INTRAVENOUS
  Administered 2019-08-03 – 2019-08-05 (×3): 4 mg via INTRAVENOUS
  Filled 2019-08-03 (×4): qty 1

## 2019-08-03 MED ORDER — METOPROLOL TARTRATE 25 MG PO TABS
25.0000 mg | ORAL_TABLET | Freq: Two times a day (BID) | ORAL | Status: DC
  Administered 2019-08-03 – 2019-08-05 (×2): 25 mg via ORAL
  Filled 2019-08-03 (×4): qty 1

## 2019-08-03 MED ORDER — QUETIAPINE FUMARATE 50 MG PO TABS
25.0000 mg | ORAL_TABLET | Freq: Every day | ORAL | Status: DC
  Administered 2019-08-03 – 2019-08-06 (×3): 25 mg via ORAL
  Filled 2019-08-03 (×3): qty 1

## 2019-08-03 NOTE — Progress Notes (Signed)
Patient very agitated, trying to get out of bed and leave.  MD made aware.

## 2019-08-03 NOTE — Consult Note (Addendum)
CARDIOLOGY CONSULT NOTE  Patient ID: [name] MRN: [id number] DOB/AGE: 1930/02/18 86 y.o.  Admit date: 08/01/2019 Referring Physician: Damita Lack, MD Primary Physician:  Lawerance Cruel, MD  Primary cardiologist: Dr. Adrian Prows Inpatient consultant: Rex Kras, [facility]  Reason for Consultation:  Acute congestive heart failure  HPI:  [name] is a 86 y.o. 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  Patient is a poor historian secondary to dementia.  Secondary to sundowning and was recently treated with Ativan and Haldol majority of history of present illness is obtained by review of electronic medical records and discussing with her husband (who can be reached at [phone]).  Prior to coming to the hospitalization patient was having episodes of insomnia, nausea and shortness of breath.  When EMS was called she was noted to be hypoxic with O2 saturations of 70% per EMR.  According to patient's husband she has not been complaining of chest pain either at rest or with effort related activities.  Shortness of breath has been going on for couple months but usually symptoms resolve in seconds.  No symptoms of orthopnea, paroxysmal nocturnal dyspnea or lower extremity swelling.  She was patient was in atrial flutter on EKG and most recent echocardiogram noted reduced LVEF at 40-45% compared to prior study which was 65%.  Cardiology was consulted for further recommendations.  Patient states that the diagnosis of atrial flutter appears to be new for the patient.  No prior history of gastrointestinal intracranial bleed.  She does have history of falls and walks with a walker at times.  Probably noncardiac conditions to consider  prior to initiating oral anticoagulation is dementia and medication noncompliance.  Patient was last seen in the office back in 2016 and has been lost to follow-up.  ALLERGIES: Allergies  Allergen Reactions  . Compazine [Prochlorperazine Edisylate]     could not talk or hold head up  . Demerol [Meperidine]     itch, nasal cong  . Dilaudid [Hydromorphone Hcl]     itch  . Novocain [Procaine]     heart races  . Other     "some antibiotic": muscle flexion Tetanus-Diphth-Acell Pertussis: left side numb    PAST MEDICAL HISTORY: Past Medical History:  Diagnosis Date  . Allergy   . CHF (congestive heart failure) ([place])   . Dementia ([place])   . Fibromyalgia    jt pains, Tramadol Miller 0-4 per day   . Fractured hip ([place]) 10/2010   fractured left hip, L THR   . IBS (irritable bowel syndrome)    IBS-constipation  . On prednisone therapy 2008-2010   Continuous prednisone therapy   . Palpitations    paroxysmal atrial tachycardia, EF normal, diastolic dysfunction-echo/Holter 10/10 (she did not wish to take medications prescribed, metoprolol/HCTZ)  . PNA (pneumonia)    w/lung abscess at 15 months of age    PAST SURGICAL HISTORY: Past Surgical History:  Procedure Laterality Date  . CHOLECYSTECTOMY  1998  . COLONOSCOPY     2005 wnl per old records  . CYST EXCISION     breast cyst removed   . LOBECTOMY  1956   lung, partial   . NASAL SINUS SURGERY  1982    FAMILY HISTORY: The patient family history includes Heart attack in his mother; Lupus  in his paternal grandmother.   SOCIAL HISTORY:  The patient  reports that he has quit smoking. He has never used smokeless tobacco. He reports that he does not drink alcohol and does not use drugs.   Current Outpatient Medications  Medication Instructions  . aspirin 81 mg, Oral, Daily  . furosemide (LASIX) 20 mg, Oral, Daily  . hydrOXYzine (ATARAX/VISTARIL) 25 mg, Oral, 3 times daily PRN, HydrOXYzine HCl 25 Tablet 1/2 to 1 tablet Three  times a day Orally 30 days  . lisinopril (ZESTRIL) 2.5 mg, Oral, Daily  . metoprolol tartrate (LOPRESSOR) 12.5 mg, Oral, 2 times daily  . potassium chloride SA (K-DUR,KLOR-CON) 20 MEQ tablet 20 mEq, Oral, Daily    Review of Systems  Unable to perform ROS: dementia   PHYSICAL EXAM: Vitals with BMI 08/03/2019 08/03/2019 08/03/2019  Height - - -  Weight - - -  BMI - - -  Systolic 825 003 704  Diastolic 888 916 83  Pulse 108 106 109     Intake/Output Summary (Last 24 hours) at 08/03/2019 2306 Last data filed at 08/03/2019 1430 Gross per 24 hour  Intake 480 ml  Output 200 ml  Net 280 ml    Net IO Since Admission: 770 mL [08/03/19 2306] CONSTITUTIONAL: Appears older than stated age, frail female, resting in bed, no acute distress SKIN: Skin is warm and dry. No rash noted. No cyanosis. No pallor. No jaundice HEAD: Normocephalic and atraumatic. NECK: No JVD present.. No carotid bruits  LYMPHATIC: No visible cervical adenopathy.  CHEST Normal respiratory effort. No intercostal retractions  LUNGS: Clear to auscultation bilaterally, decreased breath sounds at the left lower base.  No stridor. No wheezes. No rales.  CARDIOVASCULAR: Irregularly irregular, variable X4-H0, soft holosystolic murmur heard at the apex, no gallops or rubs. ABDOMINAL: No apparent ascites.  EXTREMITIES: No peripheral edema.  RADIOLOGY: ECHOCARDIOGRAM COMPLETE  Result Date: 08/02/2019    ECHOCARDIOGRAM REPORT   Patient Name:   [name] Date of Exam: 08/02/2019 Medical Rec #:  [id number]       Height:       62.0 in Accession #:    [id number]      Weight:       114.4 lb Date of Birth:  04-18-1930        BSA:          1.508 m Patient Age:    13 years        BP:           108/61 mmHg Patient Gender: F               HR:           94 bpm. Exam Location:  Inpatient Procedure: 2D Echo, Cardiac Doppler and Color Doppler Indications:    I42.9 Cardiomyopathy (unspecified)  History:        Patient has prior history of  Echocardiogram examinations, most                 recent 07/30/2014. CHF.  Sonographer:    Jonelle Sidle Dance Referring Phys: [id number] ANKIT CHIRAG AMIN IMPRESSIONS  1. Left ventricular ejection fraction, by estimation, is 40 to 45%. The left ventricle has mildly decreased function. The left ventricle demonstrates global hypokinesis. There is moderate left ventricular hypertrophy. Left ventricular diastolic parameters are indeterminate.  2. Right ventricular systolic function is mildly reduced. The right ventricular size is normal. There is normal pulmonary artery systolic pressure. The estimated right ventricular systolic pressure is  34.4 mmHg.  3. Left atrial size was mildly dilated.  4. The mitral valve is abnormal, mildly thickened and calcified with somewhat restricted posterior leaflet. Mild mitral valve regurgitation.  5. Tricuspid valve regurgitation is moderate.  6. The aortic valve is tricuspid, severely calcified with decreased cusp excursion. Aortic valve regurgitation is trivial. Probable severe, low gradient aortic valve stenosis (although pseudostenosis with reduced LVEF also possible). Aortic valve area, by VTI measures 0.66 cm. Aortic valve mean gradient measures 6.5 mmHg. Aortic valve Vmax measures 1.66 m/s. Dimentionless index 0.29.  7. The inferior vena cava is normal in size with <50% respiratory variability, suggesting right atrial pressure of 8 mmHg. FINDINGS  Left Ventricle: Left ventricular ejection fraction, by estimation, is 40 to 45%. The left ventricle has mildly decreased function. The left ventricle demonstrates global hypokinesis. The left ventricular internal cavity size was normal in size. There is  moderate left ventricular hypertrophy. Left ventricular diastolic parameters are indeterminate. Right Ventricle: The right ventricular size is normal. No increase in right ventricular wall thickness. Right ventricular systolic function is mildly reduced. There is normal pulmonary artery  systolic pressure. The tricuspid regurgitant velocity is 2.57 m/s, and with an assumed right atrial pressure of 8 mmHg, the estimated right ventricular systolic pressure is 32.9 mmHg. Left Atrium: Left atrial size was mildly dilated. Right Atrium: Right atrial size was normal in size. Pericardium: There is no evidence of pericardial effusion. Mitral Valve: The mitral valve is abnormal. There is mild thickening of the mitral valve leaflet(s). There is mild calcification of the mitral valve leaflet(s). Mild mitral valve regurgitation. Tricuspid Valve: The tricuspid valve is grossly normal. Tricuspid valve regurgitation is moderate. Aortic Valve: The aortic valve is tricuspid. Aortic valve regurgitation is trivial. Severe aortic stenosis is present. Moderate aortic valve annular calcification. There is severe calcifcation of the aortic valve. Aortic valve mean gradient measures 6.5 mmHg. Aortic valve peak gradient measures 11.1 mmHg. Aortic valve area, by VTI measures 0.66 cm. Pulmonic Valve: The pulmonic valve was grossly normal. Pulmonic valve regurgitation is trivial. Aorta: The aortic root is normal in size and structure. Venous: The inferior vena cava is normal in size with less than 50% respiratory variability, suggesting right atrial pressure of 8 mmHg. IAS/Shunts: No atrial level shunt detected by color flow Doppler.  LEFT VENTRICLE PLAX 2D LVIDd:         3.50 cm LVIDs:         2.50 cm LV PW:         1.50 cm LV IVS:        1.30 cm LVOT diam:     1.70 cm LV SV:         20 LV SV Index:   13 LVOT Area:     2.27 cm  RIGHT VENTRICLE          IVC RV Basal diam:  3.10 cm  IVC diam: 2.10 cm RV Mid diam:    1.70 cm TAPSE (M-mode): 0.6 cm LEFT ATRIUM             Index       RIGHT ATRIUM           Index LA diam:        3.50 cm 2.32 cm/m  RA Area:     20.70 cm LA Vol (A2C):   68.6 ml 45.50 ml/m RA Volume:   59.00 ml  39.13 ml/m LA Vol (A4C):   22.0 ml 14.59 ml/m LA Biplane Vol: 40.1  ml 26.60 ml/m  AORTIC VALVE AV  Area (Vmax):    0.69 cm AV Area (Vmean):   0.62 cm AV Area (VTI):     0.66 cm AV Vmax:           166.50 cm/s AV Vmean:          117.500 cm/s AV VTI:            0.302 m AV Peak Grad:      11.1 mmHg AV Mean Grad:      6.5 mmHg LVOT Vmax:         50.50 cm/s LVOT Vmean:        32.300 cm/s LVOT VTI:          0.088 m LVOT/AV VTI ratio: 0.29  AORTA Ao Root diam: 3.00 cm Ao Asc diam:  2.90 cm MITRAL VALVE               TRICUSPID VALVE MV Area (PHT): 3.32 cm    TR Peak grad:   26.4 mmHg MV Decel Time: 229 msec    TR Vmax:        257.00 cm/s MV E velocity: 84.25 cm/s                            SHUNTS                            Systemic VTI:  0.09 m                            Systemic Diam: 1.70 cm Rozann Lesches MD Electronically signed by Rozann Lesches MD Signature Date/Time: 08/02/2019/12:45:50 PM    Final     LABORATORY DATA: Lab Results  Component Value Date   WBC 7.2 08/03/2019   HGB 11.2 (L) 08/03/2019   HCT 36.8 08/03/2019   MCV 86.6 08/03/2019   PLT 263 08/03/2019    Recent Labs  Lab 08/01/19 1225 08/01/19 1823 08/03/19 0431  NA 133*   < > 134*  K 6.2*   < > 4.7  CL 98   < > 99  CO2 26   < > 24  BUN 10   < > 14  CREATININE 0.85   < > 0.76  CALCIUM 8.7*   < > 8.7*  PROT 7.2  --   --   BILITOT 1.4*  --   --   ALKPHOS 60  --   --   ALT 13  --   --   AST 41  --   --   GLUCOSE 86   < > 87   < > = values in this interval not displayed.    Lipid Panel  No results found for: CHOL, TRIG, HDL, CHOLHDL, VLDL, LDLCALC  BNP (last 3 results) Recent Labs    08/01/19 1823  BNP 413.3*    HEMOGLOBIN A1C No results found for: HGBA1C, MPG  Cardiac Panel (last 3 results) No results for input(s): CKTOTAL, CKMB, TROPONINIHS, RELINDX in the last 72 hours.  Lab Results  Component Value Date   CKTOTAL 110 10/08/2010   CKMB 4.7 (H) 10/08/2010     TSH Recent Labs    08/01/19 1823  TSH 1.554     Scheduled Meds: . enoxaparin (LOVENOX) injection  40 mg Subcutaneous Q24H  .  glucagon (human recombinant)  1 mg  Intravenous Once  . ipratropium  0.5 mg Nebulization BID  . levalbuterol  1.25 mg Nebulization BID  . metoprolol tartrate  25 mg Oral BID  . QUEtiapine  25 mg Oral QHS   Continuous Infusions: PRN Meds:.acetaminophen **OR** acetaminophen, haloperidol lactate, metoprolol tartrate, ondansetron **OR** ondansetron (ZOFRAN) IV, polyethylene glycol, senna-docusate, senna-docusate  CARDIAC DATABASE: EKG: 08/31/2019: Atrial flutter, 84 bpm, left axis deviation, right bundle branch block, left anterior fascicular block.  Echocardiogram: 07/2014: LVEF 65-70%, no regional wall motion abnormalities, grade 2 diastolic impairment, mild MR, mild TR, RVSP 36 mmHg.  08/02/2019: LVEF 40-45%, global hypokinesis, moderate LVH, indeterminate diastolic dysfunction, RV systolic function mildly reduced, RVSP 34 mmHg, mildly dilated left atrium, mild MR, moderate TR, probable severe/low gradient AAS (AVA by VTI 0.66 cm, mean gradient 6.5 mmHg, peak velocity 1.66 m/s, dimensional index 0.29).  IMPRESSION & RECOMMENDATIONS: [name] is a 86 y.o. adult whose past medical history and cardiovascular risk factors include:  Paroxysmal atrial tachycardia, hypertension, history of remote pleural abscess status post left lower lobe lobectomy, pulmonary nodule, advanced age, dementia.  Primary diagnosis: Atrial flutter w/ RVR on admission: improving.  . Rate control: Lopressor, may increase the dose as hemodynamics allow. Marland Kitchen Rhythm control: NA . CHA2DS2-VASc SCORE is 5 which correlates to 6.7% risk of stroke per year (reduced LVEF, hypertension, age, female). . Given the patient's underlying dementia spoke to the patient's husband in regards to oral anticoagulation for thromboembolic prophylaxis given her atrial flutter. . In the setting of her high CHA2DS2-VASc score, I reviewed with patient the benefits, risks, and alternatives of initiating/continuing anticoagulation as per the  treatment guidelines. . However, given the noncardiac conditions such as history of falls, dementia, medication noncompliance patient's husband like to hold off on initiating oral anticoagulant as he believes that the risks outweigh the benefits. . For now the decision was to hold off anticoagulation he will discuss further with his PCP and also will readdress any questions he may have as out-patient office visit.  Newly dx Acute HFrEF:   The most recent echocardiogram patient LVEF is mildly reduced to 40-45%.  Continue uptitrating guideline directed medical therapy for now.  Patient's husband would like hold off on stress test or invasive testing (I.e. heart catheterization) at this time.  Not sure if I/O are accurate as the patient has a positive fluid balance.  Strict I's and O's, daily weights, low-salt diet.  Start Lasix 20 mg IV push twice daily.  Transition her from lisinopril to losartan 12.5 mg p.o. daily  Uptitrate GDMT as hemodynamics and laboratory values allow.  Paroxysmal atrial tachycardia: Continue Lopressor.   Aortic Stenosis, possible low-flow & low-gradient AS: Will follow as her AS as outpatient. May benefit from dobutamine stress echo at later date once she is back to baseline.   Secondary diagnosis:  Dementia: management per primary team.  Noncompliance with medication tx.   Total time spent: 85 minutes  Patient's questions and concerns were addressed to her husband's satisfaction. Her husband voices understanding of the instructions provided during this encounter.   This note was created using a voice recognition software as a result there may be grammatical errors inadvertently enclosed that do not reflect the nature of this encounter. Every attempt is made to correct such errors.  Rex Kras, DO, [facility]. [place] Office: [phone] 08/03/2019, 11:06 PM

## 2019-08-03 NOTE — Progress Notes (Signed)
Paged DR Huglemyer, [name] sustaining 120s-140s pulse. Not new, [name] has afib/flutter/RVR HX, but metoprolol & Haldol have already been admin.    MEWS Guidelines - (patients age 83 and over)

## 2019-08-03 NOTE — Progress Notes (Signed)
PROGRESS NOTE    [name]  TDD:[id number] DOB: 03-Jul-1930 DOA: 08/01/2019 PCP: Lawerance Cruel, MD   Brief Narrative:  HPI: [name] is a 86 y.o. adult with medical history significant of Dementia, Osteoarthritis, IBS, HLD, P A fib not on AC brought to the hospital for nausea, insomnia and shortness of breath.  Admitted for hypercarbic/hypoxic respiratory failure secondary to CHF exacerbation complicated by a flutter with RVR.   Assessment & Plan:   Principal Problem:   Acute respiratory failure with hypoxemia (HCC) Active Problems:   Dyspnea   Dementia (HCC)   Atrial flutter (HCC)   Acute hypoxemic respiratory failure (HCC)  Acute hypoxic and hypercarbic respiratory failure on 3 L nasal cannula Acute congestive heart failure preserved ejection fraction, EF 65%.  Class III BNP elevated, pro-Cal negative Needs better rate control. Chest x-ray shows cardiomegaly Echo-EF 40 to 45%.  Likely will opt for medical management. Supplemental oxygen and supportive care. Bronchodilators- Xopenex & Ipratropium.  Incentive spirometer and flutter valve Cardiology consulted for their input  Atrial flutter with RVR, persistent Increase metoprolol to 25 mg twice daily.  Lopressor 5 mg IV as needed. -Not an anticoagulation candidate due to fall risk  Hx of Dysphagia with hypoglycemic episode Improved, continue Accu-Cheks.  Hyperkalemia Resolved  Dementia with behavioral disturbances -Status post Haldol and Ativan in the ER.  Patient is currently very drowsy, protecting her airway. -Accu-Cheks every 4 hours    DVT prophylaxis: enoxaparin (LOVENOX) injection 40 mg Start: 08/01/19 2330 Code Status: Full code Family Communication: Spoke with husband at bedside yesterday.  Unable to reach t him today  Status is: Inpatient  Dispo: The patient is from: Home              Anticipated d/c is to: Home              Anticipated d/c date is: 2 days              Patient  currently is not medically stable to d/c.  Patient needs better heart rate control thereafter PT/OT evaluation as well.   Body mass index is 19.8 kg/m.    Subjective: This morning patient is pleasantly confused.  Unable to have any coherent and meaningful conversation.  Overall denies any complaints  Review of Systems Otherwise negative except as per HPI, including: General = no fevers, chills, dizziness,  fatigue HEENT/EYES = negative for loss of vision, double vision, blurred vision,  sore throa Cardiovascular= negative for chest pain, palpitation Respiratory/lungs= negative for shortness of breath, cough, wheezing; hemoptysis,  Gastrointestinal= negative for nausea, vomiting, abdominal pain Genitourinary= negative for Dysuria MSK = Negative for arthralgia, myalgias Neurology= Negative for headache, numbness, tingling  Psychiatry= Negative for suicidal and homocidal ideation Skin= Negative for Rash   Examination: Constitutional: Elderly cachectic frail on 2 L nasal cannula Respiratory: Bibasilar crackles Cardiovascular: Irregularly irregular heart rate currently in 110s Abdomen: Nontender nondistended good bowel sounds Musculoskeletal: No edema noted Skin: No rashes seen Neurologic: CN 2-12 grossly intact.  And nonfocal Psychiatric: Alert to name only, poor judgment and insight  Objective: Vitals:   08/02/19 2027 08/03/19 0500 08/03/19 0529 08/03/19 0824  BP: (!) 141/111  129/75   Pulse: 70  72   Resp: 20  20   Temp: 98.2 F (36.8 C)  (!) 97.5 F (36.4 C)   TempSrc: Oral  Oral   SpO2: 95%  100% (!) 81%  Weight:  49.1 kg      Intake/Output  Summary (Last 24 hours) at 08/03/2019 1036 Last data filed at 08/03/2019 0533 Gross per 24 hour  Intake 0 ml  Output 200 ml  Net -200 ml   Filed Weights   08/01/19 2144 08/02/19 0500 08/03/19 0500  Weight: 51.7 kg 51.9 kg 49.1 kg     Data Reviewed:   CBC: Recent Labs  Lab 08/01/19 1225 08/01/19 1823 08/02/19 0436  08/03/19 0431  WBC 6.4 8.8 6.1 7.2  NEUTROABS 3.9  --   --   --   HGB 11.4* 10.9* 11.4* 11.2*  HCT 37.9 35.9* 38.8 36.8  MCV 88.1 88.0 89.0 86.6  PLT 280 342 255 563   Basic Metabolic Panel: Recent Labs  Lab 08/01/19 1225 08/01/19 1823 08/02/19 0436 08/03/19 0431  NA 133* 133* 133* 134*  K 6.2* 5.4* 4.6 4.7  CL 98 102 101 99  CO2 26 21* 23 24  GLUCOSE 86 95 83 87  BUN 10 12 13 14   CREATININE 0.85 0.81  0.84 0.76 0.76  CALCIUM 8.7* 8.5* 8.4* 8.7*  MG  --   --  2.0 2.0   GFR: CrCl cannot be calculated (Unknown ideal weight.). Liver Function Tests: Recent Labs  Lab 08/01/19 1225  AST 41  ALT 13  ALKPHOS 60  BILITOT 1.4*  PROT 7.2  ALBUMIN 3.5   No results for input(s): LIPASE, AMYLASE in the last 168 hours. No results for input(s): AMMONIA in the last 168 hours. Coagulation Profile: No results for input(s): INR, PROTIME in the last 168 hours. Cardiac Enzymes: No results for input(s): CKTOTAL, CKMB, CKMBINDEX, TROPONINI in the last 168 hours. BNP (last 3 results) No results for input(s): PROBNP in the last 8760 hours. HbA1C: No results for input(s): HGBA1C in the last 72 hours. CBG: Recent Labs  Lab 08/02/19 1637 08/02/19 2128 08/02/19 2348 08/03/19 0331 08/03/19 0744  GLUCAP 100* 81 88 80 101*   Lipid Profile: No results for input(s): CHOL, HDL, LDLCALC, TRIG, CHOLHDL, LDLDIRECT in the last 72 hours. Thyroid Function Tests: Recent Labs    08/01/19 1823  TSH 1.554   Anemia Panel: No results for input(s): VITAMINB12, FOLATE, FERRITIN, TIBC, IRON, RETICCTPCT in the last 72 hours. Sepsis Labs: Recent Labs  Lab 08/01/19 1230 08/01/19 1823  PROCALCITON  --  <0.10  LATICACIDVEN 1.8 1.5    Recent Results (from the past 240 hour(s))  Culture, blood (routine x 2)     Status: None (Preliminary result)   Collection Time: 08/01/19 10:57 AM   Specimen: BLOOD  Result Value Ref Range Status   Specimen Description   Final    BLOOD RIGHT  ANTECUBITAL Performed at [facility] [address] [place], [place] [address]    Special Requests   Final    BOTTLES DRAWN AEROBIC AND ANAEROBIC Blood Culture results may not be optimal due to an inadequate volume of blood received in culture bottles Performed at [facility] [address] [place], [place] [address]    Culture   Final    NO GROWTH 1 DAY Performed at [facility], [place] [address] [place], [place] [address]    Report Status PENDING  Incomplete  Culture, blood (routine x 2)     Status: None (Preliminary result)   Collection Time: 08/01/19 12:30 PM   Specimen: BLOOD  Result Value Ref Range Status   Specimen Description   Final    BLOOD LEFT HAND Performed at [facility] [address]  [place], [place] [address]    Special Requests   Final    BOTTLES DRAWN AEROBIC AND ANAEROBIC Blood Culture adequate volume Performed at [facility] [address], [place], [place] [address]    Culture   Final    NO GROWTH 1 DAY Performed at [facility], [place] [address], [place], [place] [address]    Report Status PENDING  Incomplete  SARS Coronavirus 2 by RT PCR (hospital order, performed in [facility] hospital lab) Nasopharyngeal Nasopharyngeal Swab     Status: None   Collection Time: 08/01/19 12:30 PM   Specimen: Nasopharyngeal Swab  Result Value Ref Range Status   SARS Coronavirus 2 NEGATIVE NEGATIVE Final    Comment: (NOTE) SARS-CoV-2 target nucleic acids are NOT DETECTED.  The SARS-CoV-2 RNA is generally detectable in upper and lower respiratory specimens during the acute phase of infection. The lowest concentration of SARS-CoV-2 viral copies this assay can detect is 250 copies / mL. A negative result does not preclude SARS-CoV-2 infection and should not be used as the sole basis for treatment or other patient management decisions.  A negative result may occur with improper  specimen collection / handling, submission of specimen other than nasopharyngeal swab, presence of viral mutation(s) within the areas targeted by this assay, and inadequate number of viral copies (<250 copies / mL). A negative result must be combined with clinical observations, patient history, and epidemiological information.  Fact Sheet for Patients:   StrictlyIdeas.no  Fact Sheet for Healthcare Providers: BankingDealers.co.za  This test is not yet approved or  cleared by the [place] FDA and has been authorized for detection and/or diagnosis of SARS-CoV-2 by FDA under an Emergency Use Authorization (EUA).  This EUA will remain in effect (meaning this test can be used) for the duration of the COVID-19 declaration under Section 564(b)(1) of the Act, 21 U.S.C. section 360bbb-3(b)(1), unless the authorization is terminated or revoked sooner.  Performed at [facility], [place] [address], [place], [place] [address]          Radiology Studies: [facility] Chest Port 1 View  Result Date: 08/01/2019 CLINICAL DATA:  Hypoxia. EXAM: PORTABLE CHEST 1 VIEW COMPARISON:  08/07/2014 FINDINGS: Examination demonstrates mild volume loss of the left lung with stable mild elevation of the left hemidiaphragm. Minimal left base opacification which is chronic/stable. Lungs are otherwise clear. Mild stable cardiomegaly. Remainder of the exam is unchanged. IMPRESSION: 1. No acute findings. Stable left basilar changes and mild stable volume loss of the left lung. 2.  Mild cardiomegaly. Electronically Signed   By: Marin Olp M.D.   On: 08/01/2019 11:33   ECHOCARDIOGRAM COMPLETE  Result Date: 08/02/2019    ECHOCARDIOGRAM REPORT   Patient Name:   [name] Ghan Date of Exam: 08/02/2019 Medical Rec #:  [id number]       Height:       62.0 in Accession #:    [id number]      Weight:       114.4 lb Date of Birth:  [birth year]        BSA:          1.508 m  Patient Age:    89 years        BP:           108/61 mmHg Patient Gender: F               HR:           94  bpm. Exam Location:  Inpatient Procedure: 2D Echo, Cardiac Doppler and Color Doppler Indications:    I42.9 Cardiomyopathy (unspecified)  History:        Patient has prior history of Echocardiogram examinations, most                 recent 07/30/2014. CHF.  Sonographer:    Jonelle Sidle Dance Referring Phys: [id number] [name] CHIRAG Verlene Glantz IMPRESSIONS  1. Left ventricular ejection fraction, by estimation, is 40 to 45%. The left ventricle has mildly decreased function. The left ventricle demonstrates global hypokinesis. There is moderate left ventricular hypertrophy. Left ventricular diastolic parameters are indeterminate.  2. Right ventricular systolic function is mildly reduced. The right ventricular size is normal. There is normal pulmonary artery systolic pressure. The estimated right ventricular systolic pressure is 09.3 mmHg.  3. Left atrial size was mildly dilated.  4. The mitral valve is abnormal, mildly thickened and calcified with somewhat restricted posterior leaflet. Mild mitral valve regurgitation.  5. Tricuspid valve regurgitation is moderate.  6. The aortic valve is tricuspid, severely calcified with decreased cusp excursion. Aortic valve regurgitation is trivial. Probable severe, low gradient aortic valve stenosis (although pseudostenosis with reduced LVEF also possible). Aortic valve area, by VTI measures 0.66 cm. Aortic valve mean gradient measures 6.5 mmHg. Aortic valve Vmax measures 1.66 m/s. Dimentionless index 0.29.  7. The inferior vena cava is normal in size with <50% respiratory variability, suggesting right atrial pressure of 8 mmHg. FINDINGS  Left Ventricle: Left ventricular ejection fraction, by estimation, is 40 to 45%. The left ventricle has mildly decreased function. The left ventricle demonstrates global hypokinesis. The left ventricular internal cavity size was normal in size. There is   moderate left ventricular hypertrophy. Left ventricular diastolic parameters are indeterminate. Right Ventricle: The right ventricular size is normal. No increase in right ventricular wall thickness. Right ventricular systolic function is mildly reduced. There is normal pulmonary artery systolic pressure. The tricuspid regurgitant velocity is 2.57 m/s, and with an assumed right atrial pressure of 8 mmHg, the estimated right ventricular systolic pressure is 26.7 mmHg. Left Atrium: Left atrial size was mildly dilated. Right Atrium: Right atrial size was normal in size. Pericardium: There is no evidence of pericardial effusion. Mitral Valve: The mitral valve is abnormal. There is mild thickening of the mitral valve leaflet(s). There is mild calcification of the mitral valve leaflet(s). Mild mitral valve regurgitation. Tricuspid Valve: The tricuspid valve is grossly normal. Tricuspid valve regurgitation is moderate. Aortic Valve: The aortic valve is tricuspid. Aortic valve regurgitation is trivial. Severe aortic stenosis is present. Moderate aortic valve annular calcification. There is severe calcifcation of the aortic valve. Aortic valve mean gradient measures 6.5 mmHg. Aortic valve peak gradient measures 11.1 mmHg. Aortic valve area, by VTI measures 0.66 cm. Pulmonic Valve: The pulmonic valve was grossly normal. Pulmonic valve regurgitation is trivial. Aorta: The aortic root is normal in size and structure. Venous: The inferior vena cava is normal in size with less than 50% respiratory variability, suggesting right atrial pressure of 8 mmHg. IAS/Shunts: No atrial level shunt detected by color flow Doppler.  LEFT VENTRICLE PLAX 2D LVIDd:         3.50 cm LVIDs:         2.50 cm LV PW:         1.50 cm LV IVS:        1.30 cm LVOT diam:     1.70 cm LV SV:         20 LV  SV Index:   13 LVOT Area:     2.27 cm  RIGHT VENTRICLE          IVC RV Basal diam:  3.10 cm  IVC diam: 2.10 cm RV Mid diam:    1.70 cm TAPSE (M-mode): 0.6  cm LEFT ATRIUM             Index       RIGHT ATRIUM           Index LA diam:        3.50 cm 2.32 cm/m  RA Area:     20.70 cm LA Vol (A2C):   68.6 ml 45.50 ml/m RA Volume:   59.00 ml  39.13 ml/m LA Vol (A4C):   22.0 ml 14.59 ml/m LA Biplane Vol: 40.1 ml 26.60 ml/m  AORTIC VALVE AV Area (Vmax):    0.69 cm AV Area (Vmean):   0.62 cm AV Area (VTI):     0.66 cm AV Vmax:           166.50 cm/s AV Vmean:          117.500 cm/s AV VTI:            0.302 m AV Peak Grad:      11.1 mmHg AV Mean Grad:      6.5 mmHg LVOT Vmax:         50.50 cm/s LVOT Vmean:        32.300 cm/s LVOT VTI:          0.088 m LVOT/AV VTI ratio: 0.29  AORTA Ao Root diam: 3.00 cm Ao Asc diam:  2.90 cm MITRAL VALVE               TRICUSPID VALVE MV Area (PHT): 3.32 cm    TR Peak grad:   26.4 mmHg MV Decel Time: 229 msec    TR Vmax:        257.00 cm/s MV E velocity: 84.25 cm/s                            SHUNTS                            Systemic VTI:  0.09 m                            Systemic Diam: 1.70 cm Rozann Lesches MD Electronically signed by Rozann Lesches MD Signature Date/Time: 08/02/2019/12:45:50 PM    Final         Scheduled Meds: . enoxaparin (LOVENOX) injection  40 mg Subcutaneous Q24H  . glucagon (human recombinant)  1 mg Intravenous Once  . ipratropium  0.5 mg Nebulization BID  . levalbuterol  1.25 mg Nebulization BID  . metoprolol tartrate  25 mg Oral BID   Continuous Infusions:   LOS: 1 day   Time spent= 35 mins    Mallissa Lorenzen Arsenio Loader, MD [facility]  If 7PM-7AM, please contact night-coverage  08/03/2019, 10:36 AM

## 2019-08-04 LAB — GLUCOSE, CAPILLARY
Glucose-Capillary: 102 mg/dL — ABNORMAL HIGH (ref 70–99)
Glucose-Capillary: 112 mg/dL — ABNORMAL HIGH (ref 70–99)
Glucose-Capillary: 148 mg/dL — ABNORMAL HIGH (ref 70–99)
Glucose-Capillary: 31 mg/dL — CL (ref 70–99)
Glucose-Capillary: 68 mg/dL — ABNORMAL LOW (ref 70–99)
Glucose-Capillary: 93 mg/dL (ref 70–99)
Glucose-Capillary: 94 mg/dL (ref 70–99)
Glucose-Capillary: 98 mg/dL (ref 70–99)

## 2019-08-04 LAB — CBC
HCT: 36.2 % (ref 36.0–46.0)
Hemoglobin: 11 g/dL — ABNORMAL LOW (ref 12.0–15.0)
MCH: 26.3 pg (ref 26.0–34.0)
MCHC: 30.4 g/dL (ref 30.0–36.0)
MCV: 86.4 fL (ref 80.0–100.0)
Platelets: 259 10*3/uL (ref 150–400)
RBC: 4.19 MIL/uL (ref 3.87–5.11)
RDW: 17.6 % — ABNORMAL HIGH (ref 11.5–15.5)
WBC: 8.1 10*3/uL (ref 4.0–10.5)
nRBC: 0 % (ref 0.0–0.2)

## 2019-08-04 LAB — BASIC METABOLIC PANEL
Anion gap: 10 (ref 5–15)
BUN: 13 mg/dL (ref 8–23)
CO2: 29 mmol/L (ref 22–32)
Calcium: 8.4 mg/dL — ABNORMAL LOW (ref 8.9–10.3)
Chloride: 96 mmol/L — ABNORMAL LOW (ref 98–111)
Creatinine, Ser: 0.91 mg/dL (ref 0.44–1.00)
GFR calc Af Amer: >60 mL/min (ref >60)
GFR calc non Af Amer: 57 mL/min — ABNORMAL LOW (ref >60)
Glucose, Bld: 116 mg/dL — ABNORMAL HIGH (ref 70–99)
Potassium: 4.1 mmol/L (ref 3.5–5.1)
Sodium: 135 mmol/L (ref 135–145)

## 2019-08-04 LAB — MRSA PCR SCREENING: MRSA by PCR: NEGATIVE

## 2019-08-04 LAB — MAGNESIUM: Magnesium: 1.9 mg/dL (ref 1.7–2.4)

## 2019-08-04 MED ORDER — DILTIAZEM LOAD VIA INFUSION
10.0000 mg | Freq: Once | INTRAVENOUS | Status: AC
  Administered 2019-08-04: 10 mg via INTRAVENOUS
  Filled 2019-08-04: qty 10

## 2019-08-04 MED ORDER — ORAL CARE MOUTH RINSE
15.0000 mL | Freq: Two times a day (BID) | OROMUCOSAL | Status: DC
  Administered 2019-08-04 – 2019-08-08 (×10): 15 mL via OROMUCOSAL

## 2019-08-04 MED ORDER — DILTIAZEM HCL-DEXTROSE 125-5 MG/125ML-% IV SOLN (PREMIX)
5.0000 mg/h | INTRAVENOUS | Status: DC
  Administered 2019-08-04 (×2): 5 mg/h via INTRAVENOUS
  Filled 2019-08-04 (×2): qty 125

## 2019-08-04 MED ORDER — DEXTROSE 50 % IV SOLN
INTRAVENOUS | Status: AC
  Administered 2019-08-05: 25 mL
  Filled 2019-08-04: qty 50

## 2019-08-04 MED ORDER — DEXTROSE 50 % IV SOLN
INTRAVENOUS | Status: AC
  Administered 2019-08-04: 50 mL
  Filled 2019-08-04: qty 50

## 2019-08-04 MED ORDER — LEVALBUTEROL HCL 0.63 MG/3ML IN NEBU: 0.6300 mg | INHALATION_SOLUTION | Freq: Four times a day (QID) | RESPIRATORY_TRACT | Status: DC | PRN

## 2019-08-04 MED ORDER — METOPROLOL TARTRATE 5 MG/5ML IV SOLN
5.0000 mg | Freq: Once | INTRAVENOUS | Status: AC
  Administered 2019-08-04: 5 mg via INTRAVENOUS

## 2019-08-04 MED ORDER — CHLORHEXIDINE GLUCONATE CLOTH 2 % EX PADS
6.0000 | MEDICATED_PAD | Freq: Every day | CUTANEOUS | Status: DC
  Administered 2019-08-04 – 2019-08-10 (×8): 6 via TOPICAL

## 2019-08-04 MED ORDER — IPRATROPIUM BROMIDE 0.02 % IN SOLN: 0.5000 mg | Freq: Four times a day (QID) | RESPIRATORY_TRACT | Status: DC | PRN

## 2019-08-04 MED ORDER — GLUCAGON HCL RDNA (DIAGNOSTIC) 1 MG IJ SOLR
1.0000 mg | Freq: Once | INTRAMUSCULAR | Status: AC | PRN
  Administered 2019-08-05: 1 mg via INTRAVENOUS
  Filled 2019-08-04 (×2): qty 1

## 2019-08-04 MED ORDER — ENSURE ENLIVE PO LIQD
237.0000 mL | Freq: Two times a day (BID) | ORAL | Status: DC
  Administered 2019-08-05 – 2019-08-09 (×2): 237 mL via ORAL

## 2019-08-04 NOTE — Progress Notes (Signed)
AC & RR called to assist with patient situation. Reached out to Dr Hal Hope again after no response from [facility], who deferred to [facility] at [phone]. Paged cards with no response. New orders for Metoprolol 79m IVP, repeat in 5 minutes if no response, then patient would go on card gtt.   2nd dose of met was given IVP, still in RVR. RR to move patient to ICU [place].   Husband called to notify of transfer & new room number.

## 2019-08-04 NOTE — Progress Notes (Signed)
Hypoglycemic Event  CBG: 31   Treatment: 25g D50 given   Symptoms: none   Follow-up CBG: Time: 0842 CBG Result: 148   Possible Reasons for Event: Poor PO intake due to confusion  Comments/MD notified: Reesa Chew MD     [place]

## 2019-08-04 NOTE — Significant Event (Signed)
Rapid Response Event Note  Overview: Time Called: 0043 Arrival Time: 0046 Event Type: Cardiac  Initial Focused Assessment: Primary nurse called and voiced concerns about patient having a rapid heart rate. Patient is on telemetry but will not leave leads on. Primary nurse reached out to the Dr. Hal Hope new order for metoprolol 5 mg x 2, EKG per RR standing orders. EKG shows, A fib RVR.   Interventions: EKG Metoprolol 5 mg x 2 Transfer to [place] for Cardizem gtt.  Closer monitoring   Event Summary: EKG Metoprolol 5 mg x 2 Cardiac monitoring  Cardizem gtt once in ICU Transferred patient to [place], Name of Physician Notified: Dr. Paula Compton at [place]  Name of Consulting Physician Notified: Dr. Terri Skains, at [place]   Event End Time: [place] Ralphael [place] MSN, RN-BC  [place]

## 2019-08-04 NOTE — TOC Initial Note (Signed)
Transition of Care [facility]) - Initial/Assessment Note    Patient Details  Name: [name] MRN: [id number] Date of Birth: 01/18/1931  [facility] CM/SW Contact:    Leeroy Cha, RN Phone Number: 08/04/2019, 8:13 AM  Clinical Narrative:                 Transferred to icu on [phone] due to a.fib and iv Cardizem drip.  Plan: will follow for home needs.  Expected Discharge Plan: Home/Self Care Barriers to Discharge: Continued Medical Work up   Patient Goals and CMS Choice Patient states their goals for this hospitalization and ongoing recovery are:: transferred to icu [id number]      Expected Discharge Plan and Services Expected Discharge Plan: Home/Self Care   Discharge Planning Services: CM Consult   Living arrangements for the past 2 months: Single Family Home                                      Prior Living Arrangements/Services Living arrangements for the past 2 months: Single Family Home Lives with:: Spouse Patient language and need for interpreter reviewed:: No        Need for Family Participation in Patient Care: Yes (Comment) Care giver support system in place?: Yes (comment)   Criminal Activity/Legal Involvement Pertinent to Current Situation/Hospitalization: No - Comment as needed  Activities of Daily Living      Permission Sought/Granted                  Emotional Assessment Appearance:: Appears stated age     Orientation: : Oriented to Self, Oriented to Place, Oriented to  Time, Oriented to Situation Alcohol / Substance Use: Not Applicable Psych Involvement: No (comment)  Admission diagnosis:  Noncompliance with medication regimen [Z91.14] Longstanding persistent atrial fibrillation (HCC) [I48.11] Acute hypoxemic respiratory failure ([place]) [J96.01] Dementia with behavioral disturbance, unspecified dementia type ([place]) [F03.91] Patient Active Problem List   Diagnosis Date Noted  . Atrial flutter ([place]) 08/01/2019  .  Acute hypoxemic respiratory failure ([place]) 08/01/2019  . Acute respiratory failure ([place])   . Acute respiratory failure with hypoxia ([place])   . Shock ([place]) 08/06/2014  . Acute diastolic CHF (congestive heart failure) ([place]) 08/06/2014  . Acute respiratory failure with hypoxemia ([place]) 08/05/2014  . Respiratory failure ([place])   . Dyspnea 07/30/2014  . Acute CHF ([place]) 07/30/2014  . Accelerated hypertension 07/30/2014  . Anemia 07/30/2014  . Dementia ([place]) 07/30/2014  . Acute confusional state 07/30/2014   PCP:  Lawerance Cruel, MD Pharmacy:   [place], [place] - [address] [place] [place] [address] Phone: [phone] Fax: [phone]     Social Determinants of Health (SDOH) Interventions    Readmission Risk Interventions No flowsheet data found.

## 2019-08-04 NOTE — Progress Notes (Signed)
PROGRESS NOTE    [name]  YTK:[id number] DOB: [birth year] DOA: 08/01/2019 PCP: Lawerance Cruel, MD   Brief Narrative:  HPI: [name] is a 86 y.o. adult with medical history significant of Dementia, Osteoarthritis, IBS, HLD, P A fib not on AC brought to the hospital for nausea, insomnia and shortness of breath.  Admitted for hypercarbic/hypoxic respiratory failure secondary to CHF exacerbation complicated by a flutter with RVR.  Hospital stay complicated by poor oral intake, refusal of medications and delirium.   Assessment & Plan:   Principal Problem:   Acute respiratory failure with hypoxemia (HCC) Active Problems:   Dyspnea   Dementia (HCC)   Atrial flutter (HCC)   Acute hypoxemic respiratory failure (HCC)  Acute hypoxic and hypercarbic respiratory failure on 3 L nasal cannula Acute congestive heart failure preserved ejection fraction, EF 45%.  Class III Pro-Cal negative, BNP elevated Needs better rate control.  Lopressor increased Chest x-ray shows cardiomegaly Echo-EF 40 to 45%.  Opted for medical management Bronchodilators- Xopenex & Ipratropium.  Incentive spirometer and flutter valve Appreciate cardiology input  Atrial flutter with RVR, persistent Metoprolol increased to 25 mg twice daily.  Intermittently refusing medication -Currently on Cardizem drip. -Not anticoagulation candidate due to falls  Hx of Dysphagia with hypoglycemic episode Amp of D50 given this morning.  Continue Accu-Cheks.  Glucagon ordered.  Hyperkalemia Resolved  Dementia with behavioral disturbances Frequent episodes of delirium, Haldol as needed ordered.  Only Ativan really has been helping but really can avoid giving her Ativan. Bedtime Seroquel started   DVT prophylaxis: enoxaparin (LOVENOX) injection 40 mg Start: 08/01/19 2330 Code Status: Full code Family Communication: None at bedside  Status is: Inpatient  Dispo: The patient is from: Home               Anticipated d/c is to: Home              Anticipated d/c date is: 2 days              Patient currently is not medically stable to d/c.  Still having very frequent delirium complicating things as she does not take her p.o. medication leading to worsening of A. fib with RVR. Body mass index is 19.56 kg/m.    Subjective: Yesterday afternoon patient was quite delirious requiring Haldol and Ativan.  Had intermittent episodes of A. fib with RVR during the day but in the evening she remain persistent RVR therefore transferred to stepdown on Cardizem drip. Patient is currently sleeping therefore did not wake her up, she appears to be finally resting  Review of Systems Otherwise negative except as per HPI, including: Difficult to obtain review of systems due to her mentation   Examination: Constitutional: Cachectic elderly frail on 2 L nasal cannula Respiratory: Bibasilar crackles Cardiovascular: Irregularly irregular Abdomen: Nontender nondistended good bowel sounds Musculoskeletal: Contracted extremities Skin: No rashes seen Neurologic: Difficult to assess but is grossly moving all the extremities Psychiatric: Difficult to assess  Objective: Vitals:   08/04/19 0400 08/04/19 0500 08/04/19 0700 08/04/19 0800  BP: 118/81 (!) 163/142  (!) 87/48  Pulse: (!) 48 75  72  Resp: (!) 24  20 18   Temp: 97.6 F (36.4 C)   (!) 96.5 F (35.8 C)  TempSrc: Axillary   Axillary  SpO2: (!) 73% 95%  97%  Weight:  48.5 kg      Intake/Output Summary (Last 24 hours) at 08/04/2019 1011 Last data filed at 08/04/2019 0826 Gross per 24 hour  Intake 152.61 ml  Output 400 ml  Net -247.39 ml   Filed Weights   08/02/19 0500 08/03/19 0500 08/04/19 0500  Weight: 51.9 kg 49.1 kg 48.5 kg     Data Reviewed:   CBC: Recent Labs  Lab 08/01/19 1225 08/01/19 1823 08/02/19 0436 08/03/19 0431 08/04/19 0238  WBC 6.4 8.8 6.1 7.2 8.1  NEUTROABS 3.9  --   --   --   --   HGB 11.4* 10.9* 11.4* 11.2* 11.0*    HCT 37.9 35.9* 38.8 36.8 36.2  MCV 88.1 88.0 89.0 86.6 86.4  PLT 280 342 255 263 790   Basic Metabolic Panel: Recent Labs  Lab 08/01/19 1225 08/01/19 1823 08/02/19 0436 08/03/19 0431 08/04/19 0238  NA 133* 133* 133* 134* 135  K 6.2* 5.4* 4.6 4.7 4.1  CL 98 102 101 99 96*  CO2 26 21* 23 24 29   GLUCOSE 86 95 83 87 116*  BUN 10 12 13 14 13   CREATININE 0.85 0.81  0.84 0.76 0.76 0.91  CALCIUM 8.7* 8.5* 8.4* 8.7* 8.4*  MG  --   --  2.0 2.0 1.9   GFR: CrCl cannot be calculated (Unknown ideal weight.). Liver Function Tests: Recent Labs  Lab 08/01/19 1225  AST 41  ALT 13  ALKPHOS 60  BILITOT 1.4*  PROT 7.2  ALBUMIN 3.5   No results for input(s): LIPASE, AMYLASE in the last 168 hours. No results for input(s): AMMONIA in the last 168 hours. Coagulation Profile: No results for input(s): INR, PROTIME in the last 168 hours. Cardiac Enzymes: No results for input(s): CKTOTAL, CKMB, CKMBINDEX, TROPONINI in the last 168 hours. BNP (last 3 results) No results for input(s): PROBNP in the last 8760 hours. HbA1C: No results for input(s): HGBA1C in the last 72 hours. CBG: Recent Labs  Lab 08/03/19 2028 08/03/19 2356 08/04/19 0419 08/04/19 0803 08/04/19 0842  GLUCAP 91 93 102* 31* 148*   Lipid Profile: No results for input(s): CHOL, HDL, LDLCALC, TRIG, CHOLHDL, LDLDIRECT in the last 72 hours. Thyroid Function Tests: Recent Labs    08/01/19 1823  TSH 1.554   Anemia Panel: No results for input(s): VITAMINB12, FOLATE, FERRITIN, TIBC, IRON, RETICCTPCT in the last 72 hours. Sepsis Labs: Recent Labs  Lab 08/01/19 1230 08/01/19 1823  PROCALCITON  --  <0.10  LATICACIDVEN 1.8 1.5    Recent Results (from the past 240 hour(s))  Culture, blood (routine x 2)     Status: None (Preliminary result)   Collection Time: 08/01/19 10:57 AM   Specimen: BLOOD  Result Value Ref Range Status   Specimen Description   Final    BLOOD RIGHT ANTECUBITAL Performed at [facility] [address] [place], [place] [address]    Special Requests   Final    BOTTLES DRAWN AEROBIC AND ANAEROBIC Blood Culture results may not be optimal due to an inadequate volume of blood received in culture bottles Performed at [facility] [address] [place], [place] [address]    Culture   Final    NO GROWTH 3 DAYS Performed at [facility], [place] [address], [place], [place] [address]    Report Status PENDING  Incomplete  Culture, blood (routine x 2)     Status: None (Preliminary result)   Collection Time: 08/01/19 12:30 PM   Specimen: BLOOD  Result Value Ref Range Status   Specimen Description   Final    BLOOD LEFT HAND Performed at [facility],  [place] [address] [place], [place] [address]    Special Requests   Final    BOTTLES DRAWN AEROBIC AND ANAEROBIC Blood Culture adequate volume Performed at [place] [address], [place], [place] [address]    Culture   Final    NO GROWTH 3 DAYS Performed at [facility], [place] [address], [place], [place] [address]    Report Status PENDING  Incomplete  SARS Coronavirus 2 by RT PCR (hospital order, performed in [facility] hospital lab) Nasopharyngeal Nasopharyngeal Swab     Status: None   Collection Time: 08/01/19 12:30 PM   Specimen: Nasopharyngeal Swab  Result Value Ref Range Status   SARS Coronavirus 2 NEGATIVE NEGATIVE Final    Comment: (NOTE) SARS-CoV-2 target nucleic acids are NOT DETECTED.  The SARS-CoV-2 RNA is generally detectable in upper and lower respiratory specimens during the acute phase of infection. The lowest concentration of SARS-CoV-2 viral copies this assay can detect is 250 copies / mL. A negative result does not preclude SARS-CoV-2 infection and should not be used as the sole basis for treatment or other patient management decisions.  A negative result may occur with improper specimen collection / handling,  submission of specimen other than nasopharyngeal swab, presence of viral mutation(s) within the areas targeted by this assay, and inadequate number of viral copies (<250 copies / mL). A negative result must be combined with clinical observations, patient history, and epidemiological information.  Fact Sheet for Patients:   StrictlyIdeas.no  Fact Sheet for Healthcare Providers: BankingDealers.co.za  This test is not yet approved or  cleared by the [place] FDA and has been authorized for detection and/or diagnosis of SARS-CoV-2 by FDA under an Emergency Use Authorization (EUA).  This EUA will remain in effect (meaning this test can be used) for the duration of the COVID-19 declaration under Section 564(b)(1) of the Act, 21 U.S.C. section 360bbb-3(b)(1), unless the authorization is terminated or revoked sooner.  Performed at [facility], [place] [address] [place], [place] [address]   MRSA PCR Screening     Status: None   Collection Time: 08/04/19  1:35 AM   Specimen: Nasopharyngeal Wash  Result Value Ref Range Status   MRSA by PCR NEGATIVE NEGATIVE Final    Comment:        The GeneXpert MRSA Assay (FDA approved for NASAL specimens only), is one component of a comprehensive MRSA colonization surveillance program. It is not intended to diagnose MRSA infection nor to guide or monitor treatment for MRSA infections. Performed at [facility], [place] [address], [place],  [address]          Radiology Studies: ECHOCARDIOGRAM COMPLETE  Result Date: 08/02/2019    ECHOCARDIOGRAM REPORT   Patient Name:   [name] Hinely Date of Exam: 08/02/2019 Medical Rec #:  [id number]       Height:       62.0 in Accession #:    [id number]      Weight:       114.4 lb Date of Birth:  14-Dec-1930        BSA:          1.508 m Patient Age:    23 years        BP:           108/61 mmHg Patient Gender: F                HR:  94 bpm. Exam Location:  Inpatient Procedure: 2D Echo, Cardiac Doppler and Color Doppler Indications:    I42.9 Cardiomyopathy (unspecified)  History:        Patient has prior history of Echocardiogram examinations, most                 recent 07/30/2014. CHF.  Sonographer:    Jonelle Sidle Dance Referring Phys: [id number] Marykathryn Carboni CHIRAG Laydon Martis IMPRESSIONS  1. Left ventricular ejection fraction, by estimation, is 40 to 45%. The left ventricle has mildly decreased function. The left ventricle demonstrates global hypokinesis. There is moderate left ventricular hypertrophy. Left ventricular diastolic parameters are indeterminate.  2. Right ventricular systolic function is mildly reduced. The right ventricular size is normal. There is normal pulmonary artery systolic pressure. The estimated right ventricular systolic pressure is 26.9 mmHg.  3. Left atrial size was mildly dilated.  4. The mitral valve is abnormal, mildly thickened and calcified with somewhat restricted posterior leaflet. Mild mitral valve regurgitation.  5. Tricuspid valve regurgitation is moderate.  6. The aortic valve is tricuspid, severely calcified with decreased cusp excursion. Aortic valve regurgitation is trivial. Probable severe, low gradient aortic valve stenosis (although pseudostenosis with reduced LVEF also possible). Aortic valve area, by VTI measures 0.66 cm. Aortic valve mean gradient measures 6.5 mmHg. Aortic valve Vmax measures 1.66 m/s. Dimentionless index 0.29.  7. The inferior vena cava is normal in size with <50% respiratory variability, suggesting right atrial pressure of 8 mmHg. FINDINGS  Left Ventricle: Left ventricular ejection fraction, by estimation, is 40 to 45%. The left ventricle has mildly decreased function. The left ventricle demonstrates global hypokinesis. The left ventricular internal cavity size was normal in size. There is  moderate left ventricular hypertrophy. Left ventricular diastolic parameters are  indeterminate. Right Ventricle: The right ventricular size is normal. No increase in right ventricular wall thickness. Right ventricular systolic function is mildly reduced. There is normal pulmonary artery systolic pressure. The tricuspid regurgitant velocity is 2.57 m/s, and with an assumed right atrial pressure of 8 mmHg, the estimated right ventricular systolic pressure is 48.5 mmHg. Left Atrium: Left atrial size was mildly dilated. Right Atrium: Right atrial size was normal in size. Pericardium: There is no evidence of pericardial effusion. Mitral Valve: The mitral valve is abnormal. There is mild thickening of the mitral valve leaflet(s). There is mild calcification of the mitral valve leaflet(s). Mild mitral valve regurgitation. Tricuspid Valve: The tricuspid valve is grossly normal. Tricuspid valve regurgitation is moderate. Aortic Valve: The aortic valve is tricuspid. Aortic valve regurgitation is trivial. Severe aortic stenosis is present. Moderate aortic valve annular calcification. There is severe calcifcation of the aortic valve. Aortic valve mean gradient measures 6.5 mmHg. Aortic valve peak gradient measures 11.1 mmHg. Aortic valve area, by VTI measures 0.66 cm. Pulmonic Valve: The pulmonic valve was grossly normal. Pulmonic valve regurgitation is trivial. Aorta: The aortic root is normal in size and structure. Venous: The inferior vena cava is normal in size with less than 50% respiratory variability, suggesting right atrial pressure of 8 mmHg. IAS/Shunts: No atrial level shunt detected by color flow Doppler.  LEFT VENTRICLE PLAX 2D LVIDd:         3.50 cm LVIDs:         2.50 cm LV PW:         1.50 cm LV IVS:        1.30 cm LVOT diam:     1.70 cm LV SV:         20  LV SV Index:   13 LVOT Area:     2.27 cm  RIGHT VENTRICLE          IVC RV Basal diam:  3.10 cm  IVC diam: 2.10 cm RV Mid diam:    1.70 cm TAPSE (M-mode): 0.6 cm LEFT ATRIUM             Index       RIGHT ATRIUM           Index LA diam:         3.50 cm 2.32 cm/m  RA Area:     20.70 cm LA Vol (A2C):   68.6 ml 45.50 ml/m RA Volume:   59.00 ml  39.13 ml/m LA Vol (A4C):   22.0 ml 14.59 ml/m LA Biplane Vol: 40.1 ml 26.60 ml/m  AORTIC VALVE AV Area (Vmax):    0.69 cm AV Area (Vmean):   0.62 cm AV Area (VTI):     0.66 cm AV Vmax:           166.50 cm/s AV Vmean:          117.500 cm/s AV VTI:            0.302 m AV Peak Grad:      11.1 mmHg AV Mean Grad:      6.5 mmHg LVOT Vmax:         50.50 cm/s LVOT Vmean:        32.300 cm/s LVOT VTI:          0.088 m LVOT/AV VTI ratio: 0.29  AORTA Ao Root diam: 3.00 cm Ao Asc diam:  2.90 cm MITRAL VALVE               TRICUSPID VALVE MV Area (PHT): 3.32 cm    TR Peak grad:   26.4 mmHg MV Decel Time: 229 msec    TR Vmax:        257.00 cm/s MV E velocity: 84.25 cm/s                            SHUNTS                            Systemic VTI:  0.09 m                            Systemic Diam: 1.70 cm Rozann Lesches MD Electronically signed by Rozann Lesches MD Signature Date/Time: 08/02/2019/12:45:50 PM    Final         Scheduled Meds: . aspirin EC  81 mg Oral Daily  . Chlorhexidine Gluconate Cloth  6 each Topical Daily  . enoxaparin (LOVENOX) injection  40 mg Subcutaneous Q24H  . feeding supplement (ENSURE ENLIVE)  237 mL Oral BID BM  . furosemide  20 mg Intravenous BID  . glucagon (human recombinant)  1 mg Intravenous Once  . losartan  12.5 mg Oral Daily  . mouth rinse  15 mL Mouth Rinse BID  . metoprolol tartrate  25 mg Oral BID  . QUEtiapine  25 mg Oral QHS   Continuous Infusions: . diltiazem (CARDIZEM) infusion 5 mg/hr (08/04/19 0826)     LOS: 2 days   Time spent= 35 mins    Nevaan Bunton Arsenio Loader, MD [facility]  If 7PM-7AM, please contact night-coverage  08/04/2019, 10:11 AM

## 2019-08-05 ENCOUNTER — Other Ambulatory Visit: Payer: Self-pay

## 2019-08-05 ENCOUNTER — Encounter (HOSPITAL_COMMUNITY): Payer: Self-pay | Admitting: Internal Medicine

## 2019-08-05 DIAGNOSIS — R0602 Shortness of breath: Secondary | ICD-10-CM

## 2019-08-05 DIAGNOSIS — I483 Typical atrial flutter: Secondary | ICD-10-CM

## 2019-08-05 DIAGNOSIS — I471 Supraventricular tachycardia: Secondary | ICD-10-CM

## 2019-08-05 DIAGNOSIS — I5021 Acute systolic (congestive) heart failure: Secondary | ICD-10-CM

## 2019-08-05 DIAGNOSIS — Z9114 Patient's other noncompliance with medication regimen: Secondary | ICD-10-CM

## 2019-08-05 LAB — CBC
HCT: 39.9 % (ref 36.0–46.0)
Hemoglobin: 12.2 g/dL (ref 12.0–15.0)
MCH: 26 pg (ref 26.0–34.0)
MCHC: 30.6 g/dL (ref 30.0–36.0)
MCV: 84.9 fL (ref 80.0–100.0)
Platelets: 240 10*3/uL (ref 150–400)
RBC: 4.7 MIL/uL (ref 3.87–5.11)
RDW: 17.6 % — ABNORMAL HIGH (ref 11.5–15.5)
WBC: 7.9 10*3/uL (ref 4.0–10.5)
nRBC: 0 % (ref 0.0–0.2)

## 2019-08-05 LAB — GLUCOSE, CAPILLARY
Glucose-Capillary: 104 mg/dL — ABNORMAL HIGH (ref 70–99)
Glucose-Capillary: 114 mg/dL — ABNORMAL HIGH (ref 70–99)
Glucose-Capillary: 117 mg/dL — ABNORMAL HIGH (ref 70–99)
Glucose-Capillary: 119 mg/dL — ABNORMAL HIGH (ref 70–99)
Glucose-Capillary: 186 mg/dL — ABNORMAL HIGH (ref 70–99)
Glucose-Capillary: 49 mg/dL — ABNORMAL LOW (ref 70–99)
Glucose-Capillary: 77 mg/dL (ref 70–99)
Glucose-Capillary: 79 mg/dL (ref 70–99)
Glucose-Capillary: 86 mg/dL (ref 70–99)

## 2019-08-05 LAB — BASIC METABOLIC PANEL
Anion gap: 17 — ABNORMAL HIGH (ref 5–15)
BUN: 11 mg/dL (ref 8–23)
CO2: 29 mmol/L (ref 22–32)
Calcium: 8.6 mg/dL — ABNORMAL LOW (ref 8.9–10.3)
Chloride: 93 mmol/L — ABNORMAL LOW (ref 98–111)
Creatinine, Ser: 0.78 mg/dL (ref 0.44–1.00)
GFR calc Af Amer: >60 mL/min (ref >60)
GFR calc non Af Amer: >60 mL/min (ref >60)
Glucose, Bld: 120 mg/dL — ABNORMAL HIGH (ref 70–99)
Potassium: 3.4 mmol/L — ABNORMAL LOW (ref 3.5–5.1)
Sodium: 139 mmol/L (ref 135–145)

## 2019-08-05 LAB — MAGNESIUM: Magnesium: 1.9 mg/dL (ref 1.7–2.4)

## 2019-08-05 MED ORDER — DIGOXIN 125 MCG PO TABS
0.1250 mg | ORAL_TABLET | Freq: Every day | ORAL | Status: DC
  Administered 2019-08-07: 0.125 mg via ORAL
  Filled 2019-08-05 (×3): qty 1

## 2019-08-05 MED ORDER — FUROSEMIDE 10 MG/ML IJ SOLN
20.0000 mg | Freq: Every day | INTRAMUSCULAR | Status: DC
  Administered 2019-08-06 – 2019-08-10 (×5): 20 mg via INTRAVENOUS
  Filled 2019-08-05 (×5): qty 2

## 2019-08-05 MED ORDER — POTASSIUM CHLORIDE 10 MEQ/100ML IV SOLN
10.0000 meq | INTRAVENOUS | Status: AC
  Administered 2019-08-05 (×4): 10 meq via INTRAVENOUS
  Filled 2019-08-05 (×4): qty 100

## 2019-08-05 MED ORDER — DIGOXIN 0.25 MG/ML IJ SOLN
0.2500 mg | Freq: Once | INTRAMUSCULAR | Status: AC
  Administered 2019-08-05: 0.25 mg via INTRAVENOUS
  Filled 2019-08-05: qty 2

## 2019-08-05 MED ORDER — DIGOXIN 0.25 MG/ML IJ SOLN
0.1250 mg | Freq: Once | INTRAMUSCULAR | Status: AC
  Administered 2019-08-05: 0.125 mg via INTRAVENOUS
  Filled 2019-08-05: qty 2

## 2019-08-05 NOTE — Progress Notes (Signed)
Progress Note  Patient Name: [name] Date of Encounter: 08/05/2019  Attending physician: Shelly Coss, MD Primary care provider: Lawerance Cruel, MD Primary Cardiologist:  Consultant:Rayland Hamed Terri Skains, DO  Subjective: [name] is a 86 y.o. adult who was seen and examined at bedside at approximately 820am. She is sleeping.  No events overnight. Patient was started on Cardizem for ventricular rate control.  Currently patient remains in RVR with hypotension. Case discussed and reviewed with his nurse.  Objective: Vital Signs in the last 24 hours: Temp:  [96.7 F (35.9 C)-97.8 F (36.6 C)] 97.8 F (36.6 C) (06/30 0800) Pulse Rate:  [57-125] 114 (06/30 0800) Resp:  [10-36] 36 (06/30 0800) BP: (80-151)/(34-108) 91/53 (06/30 0800) SpO2:  [93 %-100 %] 93 % (06/30 0800) Weight:  [45.6 kg] 45.6 kg (06/30 0455)  Intake/Output:  Intake/Output Summary (Last 24 hours) at 08/05/2019 0851 Last data filed at 08/05/2019 0640 Gross per 24 hour  Intake 137.39 ml  Output 700 ml  Net -562.61 ml    Net IO Since Admission: -160 mL [08/05/19 0851]  Weights:  Filed Weights   08/03/19 0500 08/04/19 0500 08/05/19 0455  Weight: 49.1 kg 48.5 kg 45.6 kg    Telemetry: Personally reviewed, atrial flutter with RVR.   Physical examination: PHYSICAL EXAM: Vitals with BMI 08/05/2019 08/05/2019 08/05/2019  Height - - -  Weight - - -  BMI - - -  Systolic 91 - 86  Diastolic 53 - 60  Pulse 341 107 112    CONSTITUTIONAL: Appears older than stated age, frail female, resting in bed, no acute distress SKIN: Skin is warm and dry. No rash noted. No cyanosis. No pallor. No jaundice HEAD: Normocephalic and atraumatic. NECK: No JVD present.. No carotid bruits  LYMPHATIC: No visible cervical adenopathy.  CHEST Normal respiratory effort. No intercostal retractions  LUNGS: Clear to auscultation bilaterally, decreased breath sounds at the left lower base.  No stridor. No wheezes. No rales.   CARDIOVASCULAR: Irregularly irregular, variable P3-X9, soft holosystolic murmur heard at the apex, no gallops or rubs. ABDOMINAL: No apparent ascites.  EXTREMITIES: No peripheral edema.  Lab Results: Hematology Recent Labs  Lab 08/03/19 0431 08/04/19 0238 08/05/19 0223  WBC 7.2 8.1 7.9  RBC 4.25 4.19 4.70  HGB 11.2* 11.0* 12.2  HCT 36.8 36.2 39.9  MCV 86.6 86.4 84.9  MCH 26.4 26.3 26.0  MCHC 30.4 30.4 30.6  RDW 17.5* 17.6* 17.6*  PLT 263 259 240    Chemistry Recent Labs  Lab 08/01/19 1225 08/01/19 1823 08/03/19 0431 08/04/19 0238 08/05/19 0223  NA 133*   < > 134* 135 139  K 6.2*   < > 4.7 4.1 3.4*  CL 98   < > 99 96* 93*  CO2 26   < > 24 29 29   GLUCOSE 86   < > 87 116* 120*  BUN 10   < > 14 13 11   CREATININE 0.85   < > 0.76 0.91 0.78  CALCIUM 8.7*   < > 8.7* 8.4* 8.6*  PROT 7.2  --   --   --   --   ALBUMIN 3.5  --   --   --   --   AST 41  --   --   --   --   ALT 13  --   --   --   --   ALKPHOS 60  --   --   --   --   BILITOT 1.4*  --   --   --   --  GFRNONAA >60   < > >60 57* >60  GFRAA >60   < > >60 >60 >60  ANIONGAP 9   < > 11 10 17*   < > = values in this interval not displayed.     Cardiac Enzymes: Cardiac Panel (last 3 results) No results for input(s): CKTOTAL, CKMB, TROPONINIHS, RELINDX in the last 72 hours.  BNP (last 3 results) Recent Labs    08/01/19 1823  BNP 413.3*    ProBNP (last 3 results) No results for input(s): PROBNP in the last 8760 hours.   DDimer No results for input(s): DDIMER in the last 168 hours.   Hemoglobin A1c: No results found for: HGBA1C, MPG  TSH  Recent Labs    08/01/19 1823  TSH 1.554    Lipid Panel No results found for: CHOL, TRIG, HDL, CHOLHDL, VLDL, LDLCALC, LDLDIRECT  Imaging: No results found.  Cardiac database: EKG: 08/31/2019: Atrial flutter, 84 bpm, left axis deviation, right bundle branch block, left anterior fascicular block.  Echocardiogram: 07/2014: LVEF 65-70%, no regional wall motion  abnormalities, grade 2 diastolic impairment, mild MR, mild TR, RVSP 36 mmHg.  08/02/2019: LVEF 40-45%, global hypokinesis, moderate LVH, indeterminate diastolic dysfunction, RV systolic function mildly reduced, RVSP 34 mmHg, mildly dilated left atrium, mild MR, moderate TR, probable severe/low gradient AAS (AVA by VTI 0.66 cm, mean gradient 6.5 mmHg, peak velocity 1.66 m/s, dimensional index 0.29).  Scheduled Meds: . aspirin EC  81 mg Oral Daily  . Chlorhexidine Gluconate Cloth  6 each Topical Daily  . digoxin  0.125 mg Intravenous Once  . digoxin  0.25 mg Intravenous Once  . [START ON 08/06/2019] digoxin  0.125 mg Oral Daily  . enoxaparin (LOVENOX) injection  40 mg Subcutaneous Q24H  . feeding supplement (ENSURE ENLIVE)  237 mL Oral BID BM  . furosemide  20 mg Intravenous BID  . losartan  12.5 mg Oral Daily  . mouth rinse  15 mL Mouth Rinse BID  . metoprolol tartrate  25 mg Oral BID  . QUEtiapine  25 mg Oral QHS    Continuous Infusions: . diltiazem (CARDIZEM) infusion Stopped (08/05/19 0009)  . potassium chloride      PRN Meds: acetaminophen **OR** acetaminophen, haloperidol lactate, ipratropium, levalbuterol, metoprolol tartrate, ondansetron **OR** ondansetron (ZOFRAN) IV, polyethylene glycol, senna-docusate, senna-docusate   IMPRESSION & RECOMMENDATIONS: [name] is a 86 y.o. adult whose past medical history and cardiac risk factors include: Paroxysmal atrial tachycardia, hypertension, history of remote pleural abscess status post left lower lobe lobectomy, pulmonary nodule, advanced age, dementia.  Atrial flutter w/ RVR on admission: improving.   Currently on Lopressor 25 mg p.o. twice daily.  Would consider up titration of beta-blocker therapy as blood pressure improves.  Patient was started on Cardizem drip for rate control management.  Would recommend discontinuation of Cardizem given the reduced LVEF.  We will start digoxin.  250 mcg IV push x1 now and 125 mcg IV  push x1 6 hours later   Start digoxin 125 mcg p.o. daily starting 08/06/2019.  Recommend electrolyte replacements aggressively with potassium at 4 and magnesium at 2.    May also use IV Lopressor on as needed basis for better rate control.  Given her high chads vas score she would benefit from thromboembolic prophylaxis.  However, due to noncardiac conditions such as severe dementia, medication noncompliance, history of falls patient's husband would like to hold off on anticoagulation.  He understands that she would be at high risk of stroke given  her CHA2DS2-VASc score.  Newly dx Acute HFrEF:   The most recent echocardiogram patient LVEF is mildly reduced to 40-45%.  Continue uptitrating guideline directed medical therapy for now.  Patient's husband would like hold off on stress test or invasive testing (I.e. heart catheterization) at this time.  Not sure if I/O are accurate as the patient has a positive fluid balance.  Strict I's and O's, daily weights, low-salt diet.  Change Lasix to 20 mg IV push once a day.    Transition her from lisinopril to losartan 12.5 mg p.o. daily  Uptitrate GDMT as hemodynamics and laboratory values allow.  Paroxysmal atrial tachycardia: Continue Lopressor.   Aortic Stenosis, possible low-flow & low-gradient AS: Will follow as her AS as outpatient. May benefit from dobutamine stress echo at later date once she is back to baseline.   Secondary diagnosis:  Dementia: management per primary team.  Noncompliance with medication tx.   Patient's questions and concerns were addressed to his satisfaction. He voices understanding of the instructions provided during this encounter.   This note was created using a voice recognition software as a result there may be grammatical errors inadvertently enclosed that do not reflect the nature of this encounter. Every attempt is made to correct such errors.  Rex Kras, DO, [facility]. [place] Office:  [phone] 08/05/2019, 8:51 AM

## 2019-08-05 NOTE — Progress Notes (Signed)
Hypoglycemic Event  CBG: 68  Treatment: D50  Symptoms: None  Follow-up CBG: Time:1230 CBG Result:[id number]  Possible Reasons for Event:Pt has dementia and not eating or drinking  Comments/MD notified: MD on call notified by Jed Limerick, Theodosia Paling

## 2019-08-05 NOTE — TOC Progression Note (Signed)
Transition of Care [facility]) - Progression Note    Patient Details  Name: SOPHIRA RUMLER MRN: [id number] Date of Birth: Jan 26, 1931  Transition of Care [facility]) CM/SW Contact  Leeroy Cha, RN Phone Number: 08/05/2019, 8:10 AM  Clinical Narrative:    Discharge Readiness Return to top of Atrial Fibrillation RRG - [place]  Discharge readiness is indicated by patient meeting Recovery Milestones, including ALL of the following: ? Hemodynamic stability-no heart rate 151 bp 151/108 ? Sinus rhythm or acceptable ventricular rate no ? No evidence of myocardial ischemia n/a ? Mental status at baseline yes ? Tachypnea absent reps rate 29 ? Hypoxemia absent  On 1 o2 via [place] ? Anticoagulants regimen for next level of care established bno ? Antiarrhythmic medication absent or no requirement for further inpatient ECG monitoring ? No remains on iv Cardizem ? Ambulatory or acceptable for next level of care bed rest ? Oral hydration[R]v yes ? Oral medications or regimen acceptable for next level of care ? No iv Cardizem ? Oral diet or acceptable for next level of care yes ? Discharge plans and education understood  Not at this time. Remains in icu    Expected Discharge Plan: Home/Self Care Barriers to Discharge: Continued Medical Work up  Expected Discharge Plan and Services Expected Discharge Plan: Home/Self Care   Discharge Planning Services: CM Consult   Living arrangements for the past 2 months: Single Family Home                                       Social Determinants of Health (SDOH) Interventions    Readmission Risk Interventions No flowsheet data found.

## 2019-08-05 NOTE — Plan of Care (Signed)
  Problem: Clinical Measurements: Goal: Respiratory complications will improve Outcome: Progressing Goal: Cardiovascular complication will be avoided Outcome: Not Progressing

## 2019-08-05 NOTE — Progress Notes (Signed)
PROGRESS NOTE    [name]  QBH:[id number] DOB: [birth year] DOA: 08/01/2019 PCP: Lawerance Cruel, MD   Brief Narrative:  Patient is 86 year old female with history of dementia, osteoarthritis, IBS, hyperlipidemia, paroxysmal A. fib not on anticoagulation who was brought to emergency department with complaints of nausea, insomnia, shortness of breath.  She was admitted for hypoxic, hypercarbic respiratory failure secondary to CHF exacerbation.  Hospital course remarkable for atrial flutter with RVR, poor oral intake, delirium.  Cardiology closely following.  Assessment & Plan:   Principal Problem:   Acute respiratory failure with hypoxemia (HCC) Active Problems:   Dyspnea   Dementia (HCC)   Atrial flutter (HCC)   Acute hypoxemic respiratory failure (HCC)   Acute hypoxic/hypercarbic respiratory failure: Most likely secondary to acute congestive heart failure exacerbation.  Currently on 1 L/min. we will continue to wean the oxygen.  Chest x-ray showed cardiomegaly.  Continue bronchodilators, incentive spirometer, flutter valve.  Acute systolic heart failure: Echocardiogram showed ejection fraction of 40 to 45%.  On IV Lasix 20 mg daily.  Also started on losartan and metoprolol.  Patient's family dont  want diagnostic cath.  A flutter with RVR: Continue metoprolol at current dose.  Cardizem to be discontinued as per cardiology due to low ejection fraction.  Not a candidate for anticoagulation due to falls.  On digoxin and metoprolol.  CEA still remains in RVR.  Continue to monitor in telemetry  Dysphagia: Has poor oral intake.  Continue to monitor blood sugars.  Had an hypoglycemic episode.  Currently on dysphagia 3 diet.  Hyperkalemia: Resolved.Now hypokalemic ,K supplemented.  Dementia with behavioral disturbances: Frequent episodes of delirium.  Continue Haldol as needed.  Start on low-dose Seroquel.  Aortic stenosis: Cardiology recommended outpatient follow-up.  Generalized  weakness/debility/deconditioning: We will request for physical therapy evaluation when A. fib with RVR resolves         DVT prophylaxis: Lovenox Code Status: Full Family Communication: Husband present at the bedside Status is: Inpatient  Remains inpatient appropriate because:Hemodynamically unstable   Dispo: The patient is from: Home              Anticipated d/c is to: Home vs SNF              Anticipated d/c date is: 2 days              Patient currently is not medically stable to d/c.     Consultants: Cardiology  Procedures: None  Antimicrobials:  Anti-infectives (From admission, onward)   None      Subjective: Patient seen and examined at the bedside this afternoon.  She was sitting on the bed .  Husband was at the bedside.  She was in A. fib with RVR.  She denied any chest pain or shortness of breath.  She was not in any kind of distress and looked comfortable  Objective: Vitals:   08/05/19 1000 08/05/19 1100 08/05/19 1200 08/05/19 1300  BP: 109/69  129/71   Pulse:      Resp: (!) 22 (!) 22 18 (!) 25  Temp:   97.6 F (36.4 C)   TempSrc:   Axillary   SpO2:      Weight:        Intake/Output Summary (Last 24 hours) at 08/05/2019 1349 Last data filed at 08/05/2019 0640 Gross per 24 hour  Intake 137.39 ml  Output 700 ml  Net -562.61 ml   Filed Weights   08/03/19 0500 08/04/19 0500 08/05/19 0455  Weight: 49.1 kg 48.5 kg 45.6 kg    Examination:  General exam: Extremely deconditioned, debilitated, cachectic, weak, chronically ill looking Respiratory system: Bilateral equal air entry, normal vesicular breath sounds, no wheezes or crackles  Cardiovascular system: A. fib with RVR, no pedal edema. Gastrointestinal system: Abdomen is nondistended, soft and nontender. No organomegaly or masses felt. Normal bowel sounds heard. Central nervous system: Alert and awake but not oriented Extremities: No edema, no clubbing ,no cyanosis Skin: No rashes, lesions or  ulcers,no icterus ,no pallor   Data Reviewed: I have personally reviewed following labs and imaging studies  CBC: Recent Labs  Lab 08/01/19 1225 08/01/19 1225 08/01/19 1823 08/02/19 0436 08/03/19 0431 08/04/19 0238 08/05/19 0223  WBC 6.4   < > 8.8 6.1 7.2 8.1 7.9  NEUTROABS 3.9  --   --   --   --   --   --   HGB 11.4*   < > 10.9* 11.4* 11.2* 11.0* 12.2  HCT 37.9   < > 35.9* 38.8 36.8 36.2 39.9  MCV 88.1   < > 88.0 89.0 86.6 86.4 84.9  PLT 280   < > 342 255 263 259 240   < > = values in this interval not displayed.   Basic Metabolic Panel: Recent Labs  Lab 08/01/19 1823 08/02/19 0436 08/03/19 0431 08/04/19 0238 08/05/19 0223  NA 133* 133* 134* 135 139  K 5.4* 4.6 4.7 4.1 3.4*  CL 102 101 99 96* 93*  CO2 21* 23 24 29 29   GLUCOSE 95 83 87 116* 120*  BUN 12 13 14 13 11   CREATININE 0.81  0.84 0.76 0.76 0.91 0.78  CALCIUM 8.5* 8.4* 8.7* 8.4* 8.6*  MG  --  2.0 2.0 1.9 1.9   GFR: CrCl cannot be calculated (Unknown ideal weight.). Liver Function Tests: Recent Labs  Lab 08/01/19 1225  AST 41  ALT 13  ALKPHOS 60  BILITOT 1.4*  PROT 7.2  ALBUMIN 3.5   No results for input(s): LIPASE, AMYLASE in the last 168 hours. No results for input(s): AMMONIA in the last 168 hours. Coagulation Profile: No results for input(s): INR, PROTIME in the last 168 hours. Cardiac Enzymes: No results for input(s): CKTOTAL, CKMB, CKMBINDEX, TROPONINI in the last 168 hours. BNP (last 3 results) No results for input(s): PROBNP in the last 8760 hours. HbA1C: No results for input(s): HGBA1C in the last 72 hours. CBG: Recent Labs  Lab 08/05/19 0410 08/05/19 0637 08/05/19 0733 08/05/19 0806 08/05/19 1110  GLUCAP 77 86 49* 114* 104*   Lipid Profile: No results for input(s): CHOL, HDL, LDLCALC, TRIG, CHOLHDL, LDLDIRECT in the last 72 hours. Thyroid Function Tests: No results for input(s): TSH, T4TOTAL, FREET4, T3FREE, THYROIDAB in the last 72 hours. Anemia Panel: No results for  input(s): VITAMINB12, FOLATE, FERRITIN, TIBC, IRON, RETICCTPCT in the last 72 hours. Sepsis Labs: Recent Labs  Lab 08/01/19 1230 08/01/19 1823  PROCALCITON  --  <0.10  LATICACIDVEN 1.8 1.5    Recent Results (from the past 240 hour(s))  Culture, blood (routine x 2)     Status: None (Preliminary result)   Collection Time: 08/01/19 10:57 AM   Specimen: BLOOD  Result Value Ref Range Status   Specimen Description   Final    BLOOD RIGHT ANTECUBITAL Performed at [facility] [address], [place], [place] [address]    Special Requests   Final    BOTTLES DRAWN AEROBIC AND ANAEROBIC Blood Culture results may not be optimal due  to an inadequate volume of blood received in culture bottles Performed at [facility] [address], [place], [place] [address]    Culture   Final    NO GROWTH 4 DAYS Performed at [facility], [place] [address] [place], [place] [address]    Report Status PENDING  Incomplete  Culture, blood (routine x 2)     Status: None (Preliminary result)   Collection Time: 08/01/19 12:30 PM   Specimen: BLOOD  Result Value Ref Range Status   Specimen Description   Final    BLOOD LEFT HAND Performed at [facility] [address], [place], [place] [address]    Special Requests   Final    BOTTLES DRAWN AEROBIC AND ANAEROBIC Blood Culture adequate volume Performed at [facility] [address], [place], [place] [address]    Culture   Final    NO GROWTH 4 DAYS Performed at [facility], [place] [address] [place], [place] [address]    Report Status PENDING  Incomplete  SARS Coronavirus 2 by RT PCR (hospital order, performed in [facility] hospital lab) Nasopharyngeal Nasopharyngeal Swab     Status: None   Collection Time: 08/01/19 12:30 PM   Specimen: Nasopharyngeal Swab  Result Value Ref Range Status   SARS Coronavirus 2 NEGATIVE NEGATIVE Final    Comment: (NOTE) SARS-CoV-2  target nucleic acids are NOT DETECTED.  The SARS-CoV-2 RNA is generally detectable in upper and lower respiratory specimens during the acute phase of infection. The lowest concentration of SARS-CoV-2 viral copies this assay can detect is 250 copies / mL. A negative result does not preclude SARS-CoV-2 infection and should not be used as the sole basis for treatment or other patient management decisions.  A negative result may occur with improper specimen collection / handling, submission of specimen other than nasopharyngeal swab, presence of viral mutation(s) within the areas targeted by this assay, and inadequate number of viral copies (<250 copies / mL). A negative result must be combined with clinical observations, patient history, and epidemiological information.  Fact Sheet for Patients:   StrictlyIdeas.no  Fact Sheet for Healthcare Providers: BankingDealers.co.za  This test is not yet approved or  cleared by the [place] FDA and has been authorized for detection and/or diagnosis of SARS-CoV-2 by FDA under an Emergency Use Authorization (EUA).  This EUA will remain in effect (meaning this test can be used) for the duration of the COVID-19 declaration under Section 564(b)(1) of the Act, 21 U.S.C. section 360bbb-3(b)(1), unless the authorization is terminated or revoked sooner.  Performed at [facility], [place] [address], [place], [place] [address]   MRSA PCR Screening     Status: None   Collection Time: 08/04/19  1:35 AM   Specimen: Nasopharyngeal Wash  Result Value Ref Range Status   MRSA by PCR NEGATIVE NEGATIVE Final    Comment:        The GeneXpert MRSA Assay (FDA approved for NASAL specimens only), is one component of a comprehensive MRSA colonization surveillance program. It is not intended to diagnose MRSA infection nor to guide or monitor treatment for MRSA infections. Performed at [facility], [place] [address] [place],  [address]          Radiology Studies: No results found.      Scheduled Meds: . aspirin EC  81 mg Oral Daily  . Chlorhexidine Gluconate Cloth  6 each Topical Daily  .  digoxin  0.125 mg Intravenous Once  . [START ON 08/06/2019] digoxin  0.125 mg Oral Daily  . enoxaparin (LOVENOX) injection  40 mg Subcutaneous Q24H  . feeding supplement (ENSURE ENLIVE)  237 mL Oral BID BM  . [START ON 08/06/2019] furosemide  20 mg Intravenous Daily  . losartan  12.5 mg Oral Daily  . mouth rinse  15 mL Mouth Rinse BID  . metoprolol tartrate  25 mg Oral BID  . QUEtiapine  25 mg Oral QHS   Continuous Infusions: . diltiazem (CARDIZEM) infusion Stopped (08/05/19 0009)     LOS: 3 days    Time spent: 35 mins.More than 50% of that time was spent in counseling and/or coordination of care.      Shelly Coss, MD [facility] P6/30/2021, 1:49 PM

## 2019-08-05 NOTE — Plan of Care (Signed)
Discussed with patient in front of husband plan of care for the evening, pain management and bed alarm with some teach back displayed

## 2019-08-06 LAB — GLUCOSE, CAPILLARY
Glucose-Capillary: 100 mg/dL — ABNORMAL HIGH (ref 70–99)
Glucose-Capillary: 94 mg/dL (ref 70–99)
Glucose-Capillary: 91 mg/dL (ref 70–99)
Glucose-Capillary: 87 mg/dL (ref 70–99)
Glucose-Capillary: 98 mg/dL (ref 70–99)
Glucose-Capillary: 98 mg/dL (ref 70–99)

## 2019-08-06 LAB — BASIC METABOLIC PANEL
Anion gap: 9 (ref 5–15)
BUN: 14 mg/dL (ref 8–23)
CO2: 29 mmol/L (ref 22–32)
Calcium: 8.2 mg/dL — ABNORMAL LOW (ref 8.9–10.3)
Chloride: 94 mmol/L — ABNORMAL LOW (ref 98–111)
Creatinine, Ser: 0.86 mg/dL (ref 0.44–1.00)
GFR calc Af Amer: >60 mL/min (ref >60)
GFR calc non Af Amer: >60 mL/min (ref >60)
Glucose, Bld: 103 mg/dL — ABNORMAL HIGH (ref 70–99)
Potassium: 4.6 mmol/L (ref 3.5–5.1)
Sodium: 132 mmol/L — ABNORMAL LOW (ref 135–145)

## 2019-08-06 LAB — CULTURE, BLOOD (ROUTINE X 2)
Culture: NO GROWTH
Culture: NO GROWTH
Special Requests: ADEQUATE

## 2019-08-06 MED ORDER — SODIUM CHLORIDE 0.9 % IV BOLUS
500.0000 mL | Freq: Once | INTRAVENOUS | Status: AC
  Administered 2019-08-06: 500 mL via INTRAVENOUS

## 2019-08-06 MED ORDER — METOPROLOL TARTRATE 25 MG PO TABS
50.0000 mg | ORAL_TABLET | Freq: Two times a day (BID) | ORAL | Status: DC
  Administered 2019-08-06 – 2019-08-09 (×3): 50 mg via ORAL
  Filled 2019-08-06 (×5): qty 2

## 2019-08-06 NOTE — Progress Notes (Signed)
PROGRESS NOTE    [name]  SWH:[id number] DOB: 11-28-1930 DOA: 08/01/2019 PCP: Lawerance Cruel, MD   Brief Narrative:  Patient is 86 year old female with history of dementia, osteoarthritis, IBS, hyperlipidemia, paroxysmal A. fib not on anticoagulation who was brought to emergency department with complaints of nausea, insomnia, shortness of breath.  She was admitted for hypoxic, hypercarbic respiratory failure secondary to CHF exacerbation.  Hospital course remarkable for atrial flutter with RVR, poor oral intake, delirium.  Cardiology closely following.   Assessment & Plan:   Principal Problem:   Acute respiratory failure with hypoxemia (HCC) Active Problems:   Dyspnea   Dementia (HCC)   Atrial flutter (HCC)   Acute hypoxemic respiratory failure (HCC)   Acute hypoxic/hypercarbic respiratory failure: Most likely secondary to acute congestive heart failure exacerbation.  Currently on 1 L/min. we will continue to wean the oxygen.  Chest x-ray showed cardiomegaly.  Continue bronchodilators, incentive spirometer, flutter valve.  Acute systolic heart failure: Echocardiogram showed ejection fraction of 40 to 45%.  On IV Lasix 20 mg daily.  Also started on losartan and metoprolol.  Patient's family dont  want diagnostic cath.  Monitor daily weight, ins and out  A flutter with RVR: Continue metoprolol at current dose.  Cardizem  discontinued as per cardiology due to low ejection fraction.  Not a candidate for anticoagulation due to falls.  On digoxin and metoprolol.  She was still on RVR this morning.  Continue to monitor in telemetry  Dysphagia: Has poor oral intake.  Continue to monitor blood sugars.  Had an hypoglycemic episode.  Currently on dysphagia 3 diet.  Hyperkalemia: Resolved.  Became hypokalemic, supplemented and corrected  Dementia with behavioral disturbances: Frequent episodes of delirium.  Continue Haldol as needed.  Started on low-dose Seroquel.  Aortic stenosis:  Cardiology recommended outpatient follow-up.  Generalized weakness/debility/deconditioning: We will request for physical therapy evaluation when A. fib with RVR resolves         DVT prophylaxis: Lovenox Code Status: Full Family Communication: Discussed with husband 07/27/2019 at bedside Status is: Inpatient  Remains inpatient appropriate because:Hemodynamically unstable   Dispo: The patient is from: Home              Anticipated d/c is to: Home vs SNF              Anticipated d/c date is: 2 days              Patient currently is not medically stable to d/c.     Consultants: Cardiology  Procedures: None  Antimicrobials:  Anti-infectives (From admission, onward)   None      Subjective: Patient seen and examined at the bedside this morning.  Hemodynamically stable but she was in A. fib with RVR.  She was sleeping during my evaluation.  Looked comfortable  Objective: Vitals:   08/06/19 0657 08/06/19 0658 08/06/19 0659 08/06/19 0700  BP:    (!) 128/49  Pulse: 95     Resp: (!) 24  18   Temp:      TempSrc:      SpO2: 93% 90%    Weight:      Height:        Intake/Output Summary (Last 24 hours) at 08/06/2019 0724 Last data filed at 08/06/2019 0414 Gross per 24 hour  Intake 826.74 ml  Output 525 ml  Net 301.74 ml   Filed Weights   08/04/19 0500 08/05/19 0455 08/06/19 0303  Weight: 48.5 kg 45.6 kg 46.1 kg  Examination:  General exam: Extremely deconditioned, debilitated, weak, frail, chronically ill looking Respiratory system: Bilateral equal air entry, normal vesicular breath sounds, no wheezes or crackles  Cardiovascular system: A. fib with RVR, no pedal edema Gastrointestinal system: Abdomen is nondistended, soft and nontender. No organomegaly or masses felt. Normal bowel sounds heard. Central nervous system: Sleepy Extremities: No edema, no clubbing ,no cyanosis, distal peripheral pulses palpable. Skin: No rashes, lesions or ulcers,no icterus ,no pallor      Data Reviewed: I have personally reviewed following labs and imaging studies  CBC: Recent Labs  Lab 08/01/19 1225 08/01/19 1225 08/01/19 1823 08/02/19 0436 08/03/19 0431 08/04/19 0238 08/05/19 0223  WBC 6.4   < > 8.8 6.1 7.2 8.1 7.9  NEUTROABS 3.9  --   --   --   --   --   --   HGB 11.4*   < > 10.9* 11.4* 11.2* 11.0* 12.2  HCT 37.9   < > 35.9* 38.8 36.8 36.2 39.9  MCV 88.1   < > 88.0 89.0 86.6 86.4 84.9  PLT 280   < > 342 255 263 259 240   < > = values in this interval not displayed.   Basic Metabolic Panel: Recent Labs  Lab 08/02/19 0436 08/03/19 0431 08/04/19 0238 08/05/19 0223 08/06/19 0206  NA 133* 134* 135 139 132*  K 4.6 4.7 4.1 3.4* 4.6  CL 101 99 96* 93* 94*  CO2 23 24 29 29 29   GLUCOSE 83 87 116* 120* 103*  BUN 13 14 13 11 14   CREATININE 0.76 0.76 0.91 0.78 0.86  CALCIUM 8.4* 8.7* 8.4* 8.6* 8.2*  MG 2.0 2.0 1.9 1.9  --    GFR: Estimated Creatinine Clearance: 34.2 mL/min (by C-G formula based on SCr of 0.86 mg/dL). Liver Function Tests: Recent Labs  Lab 08/01/19 1225  AST 41  ALT 13  ALKPHOS 60  BILITOT 1.4*  PROT 7.2  ALBUMIN 3.5   No results for input(s): LIPASE, AMYLASE in the last 168 hours. No results for input(s): AMMONIA in the last 168 hours. Coagulation Profile: No results for input(s): INR, PROTIME in the last 168 hours. Cardiac Enzymes: No results for input(s): CKTOTAL, CKMB, CKMBINDEX, TROPONINI in the last 168 hours. BNP (last 3 results) No results for input(s): PROBNP in the last 8760 hours. HbA1C: No results for input(s): HGBA1C in the last 72 hours. CBG: Recent Labs  Lab 08/05/19 1110 08/05/19 1608 08/05/19 2026 08/05/19 2311 08/06/19 0255  GLUCAP 104* 117* 79 119* 100*   Lipid Profile: No results for input(s): CHOL, HDL, LDLCALC, TRIG, CHOLHDL, LDLDIRECT in the last 72 hours. Thyroid Function Tests: No results for input(s): TSH, T4TOTAL, FREET4, T3FREE, THYROIDAB in the last 72 hours. Anemia Panel: No results for  input(s): VITAMINB12, FOLATE, FERRITIN, TIBC, IRON, RETICCTPCT in the last 72 hours. Sepsis Labs: Recent Labs  Lab 08/01/19 1230 08/01/19 1823  PROCALCITON  --  <0.10  LATICACIDVEN 1.8 1.5    Recent Results (from the past 240 hour(s))  Culture, blood (routine x 2)     Status: None (Preliminary result)   Collection Time: 08/01/19 10:57 AM   Specimen: BLOOD  Result Value Ref Range Status   Specimen Description   Final    BLOOD RIGHT ANTECUBITAL Performed at [facility] [address], [place], [place] [address]    Special Requests   Final    BOTTLES DRAWN AEROBIC AND ANAEROBIC Blood Culture results may not be optimal due to an inadequate volume  of blood received in culture bottles Performed at [facility], [place] [address], [place], [place] [address]    Culture   Final    NO GROWTH 4 DAYS Performed at [facility], [place] [address] [place], [place] [address]    Report Status PENDING  Incomplete  Culture, blood (routine x 2)     Status: None (Preliminary result)   Collection Time: 08/01/19 12:30 PM   Specimen: BLOOD  Result Value Ref Range Status   Specimen Description   Final    BLOOD LEFT HAND Performed at [facility] [address] [place], [place] [address]    Special Requests   Final    BOTTLES DRAWN AEROBIC AND ANAEROBIC Blood Culture adequate volume Performed at [facility] [address] [place], [place] [address]    Culture   Final    NO GROWTH 4 DAYS Performed at [facility], [place] [address], [place], [place] [address]    Report Status PENDING  Incomplete  SARS Coronavirus 2 by RT PCR (hospital order, performed in [facility] hospital lab) Nasopharyngeal Nasopharyngeal Swab     Status: None   Collection Time: 08/01/19 12:30 PM   Specimen: Nasopharyngeal Swab  Result Value Ref Range Status   SARS Coronavirus 2 NEGATIVE NEGATIVE Final    Comment: (NOTE) SARS-CoV-2  target nucleic acids are NOT DETECTED.  The SARS-CoV-2 RNA is generally detectable in upper and lower respiratory specimens during the acute phase of infection. The lowest concentration of SARS-CoV-2 viral copies this assay can detect is 250 copies / mL. A negative result does not preclude SARS-CoV-2 infection and should not be used as the sole basis for treatment or other patient management decisions.  A negative result may occur with improper specimen collection / handling, submission of specimen other than nasopharyngeal swab, presence of viral mutation(s) within the areas targeted by this assay, and inadequate number of viral copies (<250 copies / mL). A negative result must be combined with clinical observations, patient history, and epidemiological information.  Fact Sheet for Patients:   StrictlyIdeas.no  Fact Sheet for Healthcare Providers: BankingDealers.co.za  This test is not yet approved or  cleared by the [place] FDA and has been authorized for detection and/or diagnosis of SARS-CoV-2 by FDA under an Emergency Use Authorization (EUA).  This EUA will remain in effect (meaning this test can be used) for the duration of the COVID-19 declaration under Section 564(b)(1) of the Act, 21 U.S.C. section 360bbb-3(b)(1), unless the authorization is terminated or revoked sooner.  Performed at [facility], [place] [address] [place] [address], [place], [place] [address]   MRSA PCR Screening     Status: None   Collection Time: 08/04/19  1:35 AM   Specimen: Nasopharyngeal Wash  Result Value Ref Range Status   MRSA by PCR NEGATIVE NEGATIVE Final    Comment:        The GeneXpert MRSA Assay (FDA approved for NASAL specimens only), is one component of a comprehensive MRSA colonization surveillance program. It is not intended to diagnose MRSA infection nor to guide or monitor treatment for MRSA infections. Performed at [facility], [place] [address], [place],  [address]          Radiology Studies: No results found.      Scheduled Meds: . aspirin EC  81 mg Oral Daily  . Chlorhexidine Gluconate Cloth  6 each Topical Daily  . digoxin  0.125 mg  Oral Daily  . feeding supplement (ENSURE ENLIVE)  237 mL Oral BID BM  . furosemide  20 mg Intravenous Daily  . losartan  12.5 mg Oral Daily  . mouth rinse  15 mL Mouth Rinse BID  . metoprolol tartrate  25 mg Oral BID  . QUEtiapine  25 mg Oral QHS   Continuous Infusions:    LOS: 4 days    Time spent: 35 mins.More than 50% of that time was spent in counseling and/or coordination of care.      Shelly Coss, MD [facility] P7/02/2019, 7:24 AM

## 2019-08-06 NOTE — Plan of Care (Signed)

## 2019-08-06 NOTE — Progress Notes (Signed)
PT Cancellation Note  Patient Details Name: RONEKA GILPIN MRN: [id number] DOB: [birth year]   Cancelled Treatment:    Reason Eval/Treat Not Completed: Fatigue/lethargy limiting ability to participate, will check back anther time.    Claretha Cooper 08/06/2019, 10:48 AM [place] Pager [phone] Office [phone]

## 2019-08-06 NOTE — Progress Notes (Signed)
Progress Note  Patient Name: [name] Date of Encounter: 08/06/2019  Attending physician: Shelly Coss, MD Primary care provider: Lawerance Cruel, MD Primary Cardiologist:  Consultant:Agamjot Kilgallon Terri Skains, DO  Subjective: [name] is a 86 y.o. female who was seen and examined at bedside at approximately 815am. She is sleeping.  No events overnight. Patient is no longer on Cardizem drip as recommended yesterday.  Her ventricular rate has also improved.  On telemetry currently at the time of evaluation heart rate was 105 beats per minute with improvement in systolic blood pressures. No family present at bedside. Case discussed and reviewed with her nurse.  Objective: Vital Signs in the last 24 hours: Temp:  [97.6 F (36.4 C)-98.2 F (36.8 C)] 98.2 F (36.8 C) (07/01 0800) Pulse Rate:  [90-124] 93 (07/01 0800) Resp:  [3-28] 18 (07/01 0800) BP: (84-151)/(43-115) 110/48 (07/01 0800) SpO2:  [68 %-100 %] 95 % (07/01 0800) Weight:  [46.1 kg] 46.1 kg (07/01 0303)  Intake/Output:  Intake/Output Summary (Last 24 hours) at 08/06/2019 0850 Last data filed at 08/06/2019 0414 Gross per 24 hour  Intake 826.74 ml  Output 525 ml  Net 301.74 ml    Net IO Since Admission: 141.74 mL [08/06/19 0850]  Weights:  Filed Weights   08/04/19 0500 08/05/19 0455 08/06/19 0303  Weight: 48.5 kg 45.6 kg 46.1 kg    Telemetry: Personally reviewed, atrial flutter with improved ventricular rate.   Physical examination: PHYSICAL EXAM: Vitals with BMI 08/06/2019 08/06/2019 08/06/2019  Height - - -  Weight - - -  BMI - - -  Systolic 371 062 -  Diastolic 48 49 -  Pulse 93 - 95    CONSTITUTIONAL: Appears older than stated age, frail female, resting in bed, no acute distress SKIN: Skin is warm and dry. No rash noted. No cyanosis. No pallor. No jaundice HEAD: Normocephalic and atraumatic. NECK: No JVD present.. No carotid bruits  LYMPHATIC: No visible cervical adenopathy.  CHEST Normal respiratory  effort. No intercostal retractions  LUNGS: Clear to auscultation bilaterally, decreased breath sounds at the left lower base.  No stridor. No wheezes. No rales.  CARDIOVASCULAR: Irregularly irregular, variable I9-S8, soft holosystolic murmur heard at the apex, no gallops or rubs. ABDOMINAL: No apparent ascites.  EXTREMITIES: No peripheral edema.  Lab Results: Hematology Recent Labs  Lab 08/03/19 0431 08/04/19 0238 08/05/19 0223  WBC 7.2 8.1 7.9  RBC 4.25 4.19 4.70  HGB 11.2* 11.0* 12.2  HCT 36.8 36.2 39.9  MCV 86.6 86.4 84.9  MCH 26.4 26.3 26.0  MCHC 30.4 30.4 30.6  RDW 17.5* 17.6* 17.6*  PLT 263 259 240    Chemistry Recent Labs  Lab 08/01/19 1225 08/01/19 1823 08/04/19 0238 08/05/19 0223 08/06/19 0206  NA 133*   < > 135 139 132*  K 6.2*   < > 4.1 3.4* 4.6  CL 98   < > 96* 93* 94*  CO2 26   < > 29 29 29   GLUCOSE 86   < > 116* 120* 103*  BUN 10   < > 13 11 14   CREATININE 0.85   < > 0.91 0.78 0.86  CALCIUM 8.7*   < > 8.4* 8.6* 8.2*  PROT 7.2  --   --   --   --   ALBUMIN 3.5  --   --   --   --   AST 41  --   --   --   --   ALT 13  --   --   --   --  ALKPHOS 60  --   --   --   --   BILITOT 1.4*  --   --   --   --   GFRNONAA >60   < > 57* >60 >60  GFRAA >60   < > >60 >60 >60  ANIONGAP 9   < > 10 17* 9   < > = values in this interval not displayed.     Cardiac Enzymes: Cardiac Panel (last 3 results) No results for input(s): CKTOTAL, CKMB, TROPONINIHS, RELINDX in the last 72 hours.  BNP (last 3 results) Recent Labs    08/01/19 1823  BNP 413.3*    ProBNP (last 3 results) No results for input(s): PROBNP in the last 8760 hours.   DDimer No results for input(s): DDIMER in the last 168 hours.   Hemoglobin A1c: No results found for: HGBA1C, MPG  TSH  Recent Labs    08/01/19 1823  TSH 1.554    Lipid Panel No results found for: CHOL, TRIG, HDL, CHOLHDL, VLDL, LDLCALC, LDLDIRECT  Imaging: No results found.  Cardiac database: EKG: 08/31/2019: Atrial  flutter, 84 bpm, left axis deviation, right bundle branch block, left anterior fascicular block.  Echocardiogram: 07/2014: LVEF 65-70%, no regional wall motion abnormalities, grade 2 diastolic impairment, mild MR, mild TR, RVSP 36 mmHg.  08/02/2019: LVEF 40-45%, global hypokinesis, moderate LVH, indeterminate diastolic dysfunction, RV systolic function mildly reduced, RVSP 34 mmHg, mildly dilated left atrium, mild MR, moderate TR, probable severe/low gradient AAS (AVA by VTI 0.66 cm, mean gradient 6.5 mmHg, peak velocity 1.66 m/s, dimensional index 0.29).  Scheduled Meds:  aspirin EC  81 mg Oral Daily   Chlorhexidine Gluconate Cloth  6 each Topical Daily   digoxin  0.125 mg Oral Daily   feeding supplement (ENSURE ENLIVE)  237 mL Oral BID BM   furosemide  20 mg Intravenous Daily   losartan  12.5 mg Oral Daily   mouth rinse  15 mL Mouth Rinse BID   metoprolol tartrate  25 mg Oral BID   QUEtiapine  25 mg Oral QHS    Continuous Infusions:   PRN Meds: acetaminophen **OR** acetaminophen, ipratropium, levalbuterol, metoprolol tartrate, ondansetron **OR** ondansetron (ZOFRAN) IV, polyethylene glycol, senna-docusate, senna-docusate   IMPRESSION & RECOMMENDATIONS: [name] is a 86 y.o. 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  Atrial flutter w/ RVR on admission: improving ventricular rate.  Increase Lopressor to 50 mg p.o. twice daily.    Continue digoxin 125 mcg p.o. daily starting 08/06/2019.  Recommend electrolyte replacements aggressively with potassium at 4 and magnesium at 2.    May also use IV Lopressor on as needed basis for better rate control.  Given her high chads vas score she would benefit from thromboembolic prophylaxis.  However, due to underlying dementia, medication  noncompliance, history of falls patient's husband would like to hold off on anticoagulation.  He understands that she would be at high risk of stroke given her CHA2DS2-VASc score.  Newly dx Acute HFrEF:   The most recent echocardiogram patient LVEF is mildly reduced to 40-45%.  Continue uptitrating guideline directed medical therapy for now.  Patient's husband would like hold off on stress test or invasive testing (I.e. heart catheterization) at this time.  Not sure if I/O are accurate as the patient has a positive fluid balance.  Strict I's and O's, daily weights,  low-salt diet.  Transition her from lisinopril to losartan 12.5 mg p.o. daily  Continue beta-blocker therapy.  Uptitrate GDMT as hemodynamics and laboratory values allow.  Paroxysmal atrial tachycardia: Continue Lopressor.   Aortic Stenosis, possible low-flow & low-gradient AS: Will follow as her AS as outpatient. May benefit from dobutamine stress echo at later date once she is back to baseline.   Secondary diagnosis:  Dementia: management per primary team.  Noncompliance with medication tx.   Patient's questions and concerns were addressed to her satisfaction. She voices understanding of the instructions provided during this encounter.   This note was created using a voice recognition software as a result there may be grammatical errors inadvertently enclosed that do not reflect the nature of this encounter. Every attempt is made to correct such errors.  Rex Kras, DO, [facility]. [place] Office: [phone] 08/06/2019, 8:50 AM

## 2019-08-07 LAB — GLUCOSE, CAPILLARY
Glucose-Capillary: 80 mg/dL (ref 70–99)
Glucose-Capillary: 101 mg/dL — ABNORMAL HIGH (ref 70–99)
Glucose-Capillary: 77 mg/dL (ref 70–99)
Glucose-Capillary: 87 mg/dL (ref 70–99)
Glucose-Capillary: 87 mg/dL (ref 70–99)
Glucose-Capillary: 96 mg/dL (ref 70–99)
Glucose-Capillary: 63 mg/dL — ABNORMAL LOW (ref 70–99)

## 2019-08-07 LAB — BASIC METABOLIC PANEL
Anion gap: 16 — ABNORMAL HIGH (ref 5–15)
Anion gap: 13 (ref 5–15)
BUN: 21 mg/dL (ref 8–23)
BUN: 27 mg/dL — ABNORMAL HIGH (ref 8–23)
CO2: 25 mmol/L (ref 22–32)
CO2: 32 mmol/L (ref 22–32)
Calcium: 8.7 mg/dL — ABNORMAL LOW (ref 8.9–10.3)
Calcium: 9.2 mg/dL (ref 8.9–10.3)
Chloride: 97 mmol/L — ABNORMAL LOW (ref 98–111)
Chloride: 96 mmol/L — ABNORMAL LOW (ref 98–111)
Creatinine, Ser: 0.96 mg/dL (ref 0.44–1.00)
Creatinine, Ser: 0.96 mg/dL (ref 0.44–1.00)
GFR calc Af Amer: >60 mL/min (ref >60)
GFR calc Af Amer: >60 mL/min (ref >60)
GFR calc non Af Amer: 54 mL/min — ABNORMAL LOW (ref >60)
GFR calc non Af Amer: 54 mL/min — ABNORMAL LOW (ref >60)
Glucose, Bld: 86 mg/dL (ref 70–99)
Glucose, Bld: 100 mg/dL — ABNORMAL HIGH (ref 70–99)
Potassium: 5.1 mmol/L (ref 3.5–5.1)
Potassium: 4.6 mmol/L (ref 3.5–5.1)
Sodium: 138 mmol/L (ref 135–145)
Sodium: 141 mmol/L (ref 135–145)

## 2019-08-07 MED ORDER — DEXTROSE 50 % IV SOLN: 12.5000 g | Freq: Once | INTRAVENOUS | Status: AC

## 2019-08-07 MED ORDER — DEXTROSE 50 % IV SOLN
INTRAVENOUS | Status: AC
  Administered 2019-08-07: 12.5 g via INTRAVENOUS
  Filled 2019-08-07: qty 50

## 2019-08-07 MED ORDER — DEXTROSE 5 % IV SOLN: INTRAVENOUS | Status: AC

## 2019-08-07 NOTE — TOC Progression Note (Signed)
Transition of Care [facility]) - Progression Note    Patient Details  Name: [name] MRN: [id number] Date of Birth: 04-04-1930  [facility] CM/SW Contact  Leeroy Cha, RN Phone Number: 08/07/2019, 7:40 AM  Clinical Narrative:    Doing well off Cardizem, question to md -floor vs icu status. Zebulon 3l/min, bld cultures x5days neg. Plan will follow for progression of care.   Expected Discharge Plan: Home/Self Care Barriers to Discharge: Continued Medical Work up  Expected Discharge Plan and Services Expected Discharge Plan: Home/Self Care   Discharge Planning Services: CM Consult   Living arrangements for the past 2 months: Single Family Home                                       Social Determinants of Health (SDOH) Interventions    Readmission Risk Interventions No flowsheet data found.

## 2019-08-07 NOTE — Progress Notes (Signed)
Pt lethargic, able to arouse briefly , refuses all po intake

## 2019-08-07 NOTE — Progress Notes (Addendum)
PROGRESS NOTE    [name]  VZD:[id number] DOB: 11/28/1930 DOA: 08/01/2019 PCP: Lawerance Cruel, MD   Brief Narrative:  Patient is 86 year old female with history of dementia, osteoarthritis, IBS, hyperlipidemia, paroxysmal A. fib not on anticoagulation who was brought to emergency department with complaints of nausea, insomnia, shortness of breath.  She was admitted for hypoxic, hypercarbic respiratory failure secondary to CHF exacerbation.  Hospital course remarkable for atrial flutter with RVR, poor oral intake, delirium.  Cardiology  following.  Due to her advanced age and multiple comorbidities, poor oral intake, we have requested for palliative care evaluation.   Assessment & Plan:   Principal Problem:   Acute respiratory failure with hypoxemia (HCC) Active Problems:   Dyspnea   Dementia (HCC)   Atrial flutter (HCC)   Acute hypoxemic respiratory failure (HCC)   Acute hypoxic/hypercarbic respiratory failure: Most likely secondary to acute congestive heart failure exacerbation.  Currently on 1-2 L/min. we will continue to wean the oxygen.  Chest x-ray showed cardiomegaly.  Continue bronchodilators, incentive spirometer, flutter valve.  Acute systolic heart failure: Echocardiogram showed ejection fraction of 40 to 45%.  On IV Lasix 20 mg daily.  Also started on losartan and metoprolol.  Patient's family dont  want diagnostic cath.  Monitor daily weight, ins and out  A flutter with RVR: Continue metoprolol at current dose.  Cardizem  discontinued as per cardiology due to low ejection fraction.  Not a candidate for anticoagulation due to falls.  On digoxin and metoprolol.  She still has episodes of RVR this morning.  Continue to monitor in telemetry  Dysphagia: Has poor oral intake.  Continue to monitor blood sugars.  Had an hypoglycemic episode.  Currently on dysphagia 3 diet.  Hyperkalemia: Resolved.  Became hypokalemic, supplemented and corrected  Dementia with behavioral  disturbances/encephalopathy: She had frequent episodes of  Agitation/delirium during this hospitalization.  She has been very sleepy/lethargic since last 2 days.  I will discontinue Seroquel l.  Avoid sedating medications as much as possible. At baseline she has memory problem and gets confused on and off as per the husband  Aortic stenosis: Cardiology recommended outpatient follow-up.  Generalized weakness/debility/deconditioning: We have requested physical therapy evaluation.  Most likely she will need skilled nursing facility on discharge.  Goals of care:Due to her advanced age and multiple comorbidities, poor oral intake, we have requested for palliative care evaluation.She might be a candidate for residential hospice if she does not improve.Currently full code.  I discussed in detail with the husband on phone.  He wants to continue full scope of treatment at present but is willing to talk with palliative care.  Addenddum: I again talked to the husband this afternoon at bedside.  He wants to change the CODE STATUS to DNR.  He wants to continue other treatment for now and monitor what happens next.  He is also receptive about conversation with palliative care.         DVT prophylaxis: Lovenox Code Status: Full Family Communication: Discussed with husband on phone on 08/07/19 Status is: Inpatient  Remains inpatient appropriate because:Hemodynamically unstable   Dispo: The patient is from: Home              Anticipated d/c is to: Home vs SNF vs Residential Hospice              Anticipated d/c date is: 2 days              Patient currently is not medically stable  to d/c.     Consultants: Cardiology  Procedures: None  Antimicrobials:  Anti-infectives (From admission, onward)   None      Subjective: Patient seen and examined at the bedside this morning.  Heart rate fluctuating between 100-115.  Remains weak, bedbound, lethargic.  Opens her eyes on calling her name and tries to  speak.  Currently on 2 L of oxygen per minute.  Continues to have poor oral intake and an episode of hypoglycemia this morning   objective: Vitals:   08/07/19 0300 08/07/19 0400 08/07/19 0605 08/07/19 0610  BP:  (!) 99/51 (!) 108/44   Pulse: (!) 116   (!) 102  Resp: (!) 21 19 (!) 21 14  Temp:  98.2 F (36.8 C)    TempSrc:  Axillary    SpO2: 100%   100%  Weight:      Height:        Intake/Output Summary (Last 24 hours) at 08/07/2019 0745 Last data filed at 08/07/2019 0400 Gross per 24 hour  Intake 20 ml  Output 860 ml  Net -840 ml   Filed Weights   08/04/19 0500 08/05/19 0455 08/06/19 0303  Weight: 48.5 kg 45.6 kg 46.1 kg    Examination:    General exam: Deconditioned, debilitated, lethargic, frail, chronically looking Respiratory system: No wheezing or crackles  cardiovascular system: A. fib. No JVD, murmurs, rubs, gallops or clicks. Gastrointestinal system: Abdomen is nondistended, soft and nontender. No organomegaly or masses felt. Normal bowel sounds heard. Central nervous system:Not alert and awake.  Extremities: No edema, no clubbing ,no cyanosis Skin: No rashes, lesions or ulcers,no icterus ,no pallor  Data Reviewed: I have personally reviewed following labs and imaging studies  CBC: Recent Labs  Lab 08/01/19 1225 08/01/19 1225 08/01/19 1823 08/02/19 0436 08/03/19 0431 08/04/19 0238 08/05/19 0223  WBC 6.4   < > 8.8 6.1 7.2 8.1 7.9  NEUTROABS 3.9  --   --   --   --   --   --   HGB 11.4*   < > 10.9* 11.4* 11.2* 11.0* 12.2  HCT 37.9   < > 35.9* 38.8 36.8 36.2 39.9  MCV 88.1   < > 88.0 89.0 86.6 86.4 84.9  PLT 280   < > 342 255 263 259 240   < > = values in this interval not displayed.   Basic Metabolic Panel: Recent Labs  Lab 08/02/19 0436 08/02/19 0436 08/03/19 0431 08/04/19 0238 08/05/19 0223 08/06/19 0206 08/07/19 0224  NA 133*   < > 134* 135 139 132* 138  K 4.6   < > 4.7 4.1 3.4* 4.6 5.1  CL 101   < > 99 96* 93* 94* 97*  CO2 23   < > 24 29 29  29 25   GLUCOSE 83   < > 87 116* 120* 103* 86  BUN 13   < > 14 13 11 14 21   CREATININE 0.76   < > 0.76 0.91 0.78 0.86 0.96  CALCIUM 8.4*   < > 8.7* 8.4* 8.6* 8.2* 8.7*  MG 2.0  --  2.0 1.9 1.9  --   --    < > = values in this interval not displayed.   GFR: Estimated Creatinine Clearance: 30.6 mL/min (by C-G formula based on SCr of 0.96 mg/dL). Liver Function Tests: Recent Labs  Lab 08/01/19 1225  AST 41  ALT 13  ALKPHOS 60  BILITOT 1.4*  PROT 7.2  ALBUMIN 3.5   No results for input(s):  LIPASE, AMYLASE in the last 168 hours. No results for input(s): AMMONIA in the last 168 hours. Coagulation Profile: No results for input(s): INR, PROTIME in the last 168 hours. Cardiac Enzymes: No results for input(s): CKTOTAL, CKMB, CKMBINDEX, TROPONINI in the last 168 hours. BNP (last 3 results) No results for input(s): PROBNP in the last 8760 hours. HbA1C: No results for input(s): HGBA1C in the last 72 hours. CBG: Recent Labs  Lab 08/06/19 1943 08/06/19 2301 08/07/19 0304 08/07/19 0613 08/07/19 0738  GLUCAP 98 98 80 101* 77   Lipid Profile: No results for input(s): CHOL, HDL, LDLCALC, TRIG, CHOLHDL, LDLDIRECT in the last 72 hours. Thyroid Function Tests: No results for input(s): TSH, T4TOTAL, FREET4, T3FREE, THYROIDAB in the last 72 hours. Anemia Panel: No results for input(s): VITAMINB12, FOLATE, FERRITIN, TIBC, IRON, RETICCTPCT in the last 72 hours. Sepsis Labs: Recent Labs  Lab 08/01/19 1230 08/01/19 1823  PROCALCITON  --  <0.10  LATICACIDVEN 1.8 1.5    Recent Results (from the past 240 hour(s))  Culture, blood (routine x 2)     Status: None   Collection Time: 08/01/19 10:57 AM   Specimen: BLOOD  Result Value Ref Range Status   Specimen Description   Final    BLOOD RIGHT ANTECUBITAL Performed at [facility] [address] [place], [place] [address]    Special Requests   Final    BOTTLES DRAWN AEROBIC AND ANAEROBIC Blood Culture results may not  be optimal due to an inadequate volume of blood received in culture bottles Performed at [facility] [address], [place], [place] [address]    Culture   Final    NO GROWTH 5 DAYS Performed at [facility], [place] [address], [place], [place] [address]    Report Status 08/06/2019 FINAL  Final  Culture, blood (routine x 2)     Status: None   Collection Time: 08/01/19 12:30 PM   Specimen: BLOOD  Result Value Ref Range Status   Specimen Description   Final    BLOOD LEFT HAND Performed at [facility] [address] [place], [place] [address]    Special Requests   Final    BOTTLES DRAWN AEROBIC AND ANAEROBIC Blood Culture adequate volume Performed at [facility] [address] [place], [place] [address]    Culture   Final    NO GROWTH 5 DAYS Performed at [facility], [place] [address] [place], [place] [address]    Report Status 08/06/2019 FINAL  Final  SARS Coronavirus 2 by RT PCR (hospital order, performed in [facility] hospital lab) Nasopharyngeal Nasopharyngeal Swab     Status: None   Collection Time: 08/01/19 12:30 PM   Specimen: Nasopharyngeal Swab  Result Value Ref Range Status   SARS Coronavirus 2 NEGATIVE NEGATIVE Final    Comment: (NOTE) SARS-CoV-2 target nucleic acids are NOT DETECTED.  The SARS-CoV-2 RNA is generally detectable in upper and lower respiratory specimens during the acute phase of infection. The lowest concentration of SARS-CoV-2 viral copies this assay can detect is 250 copies / mL. A negative result does not preclude SARS-CoV-2 infection and should not be used as the sole basis for treatment or other patient management decisions.  A negative result may occur with improper specimen collection / handling, submission of specimen other than nasopharyngeal swab, presence of viral mutation(s) within the areas targeted by this assay, and inadequate number of viral copies (<250 copies /  mL).  A negative result must be combined with clinical observations, patient history, and epidemiological information.  Fact Sheet for Patients:   StrictlyIdeas.no  Fact Sheet for Healthcare Providers: BankingDealers.co.za  This test is not yet approved or  cleared by the [place] FDA and has been authorized for detection and/or diagnosis of SARS-CoV-2 by FDA under an Emergency Use Authorization (EUA).  This EUA will remain in effect (meaning this test can be used) for the duration of the COVID-19 declaration under Section 564(b)(1) of the Act, 21 U.S.C. section 360bbb-3(b)(1), unless the authorization is terminated or revoked sooner.  Performed at [facility], [place] [address] [place], [place] [address]   MRSA PCR Screening     Status: None   Collection Time: 08/04/19  1:35 AM   Specimen: Nasopharyngeal Wash  Result Value Ref Range Status   MRSA by PCR NEGATIVE NEGATIVE Final    Comment:        The GeneXpert MRSA Assay (FDA approved for NASAL specimens only), is one component of a comprehensive MRSA colonization surveillance program. It is not intended to diagnose MRSA infection nor to guide or monitor treatment for MRSA infections. Performed at [facility], [place] [address], [place], [place] [address]          Radiology Studies: No results found.      Scheduled Meds: . aspirin EC  81 mg Oral Daily  . Chlorhexidine Gluconate Cloth  6 each Topical Daily  . digoxin  0.125 mg Oral Daily  . feeding supplement (ENSURE ENLIVE)  237 mL Oral BID BM  . furosemide  20 mg Intravenous Daily  . losartan  12.5 mg Oral Daily  . mouth rinse  15 mL Mouth Rinse BID  . metoprolol tartrate  50 mg Oral BID  . QUEtiapine  25 mg Oral QHS   Continuous Infusions:    LOS: 5 days    Time spent: 35 mins.More than 50% of that time was spent in counseling and/or coordination of  care.      Shelly Coss, MD [facility] P7/03/2019, 7:45 AM

## 2019-08-07 NOTE — Progress Notes (Signed)
PT Cancellation Note  Patient Details Name: [name] MRN: [id number] DOB: 11-22-1930   Cancelled Treatment:    Reason Eval/Treat Not Completed: Fatigue/lethargy limiting ability to participate,  Claretha Cooper 08/07/2019, 8:18 AM [place] Pager [phone] Office [phone]

## 2019-08-08 DIAGNOSIS — F0391 Unspecified dementia with behavioral disturbance: Secondary | ICD-10-CM

## 2019-08-08 DIAGNOSIS — Z515 Encounter for palliative care: Secondary | ICD-10-CM

## 2019-08-08 LAB — CBC WITH DIFFERENTIAL/PLATELET
Abs Immature Granulocytes: 0.09 10*3/uL — ABNORMAL HIGH (ref 0.00–0.07)
Basophils Absolute: 0 10*3/uL (ref 0.0–0.1)
Basophils Relative: 1 %
Eosinophils Absolute: 0 10*3/uL (ref 0.0–0.5)
Eosinophils Relative: 1 %
HCT: 45.1 % (ref 36.0–46.0)
Hemoglobin: 12.8 g/dL (ref 12.0–15.0)
Immature Granulocytes: 1 %
Lymphocytes Relative: 9 %
Lymphs Abs: 0.6 10*3/uL — ABNORMAL LOW (ref 0.7–4.0)
MCH: 25.8 pg — ABNORMAL LOW (ref 26.0–34.0)
MCHC: 28.4 g/dL — ABNORMAL LOW (ref 30.0–36.0)
MCV: 90.9 fL (ref 80.0–100.0)
Monocytes Absolute: 1.1 10*3/uL — ABNORMAL HIGH (ref 0.1–1.0)
Monocytes Relative: 17 %
Neutro Abs: 4.6 10*3/uL (ref 1.7–7.7)
Neutrophils Relative %: 71 %
Platelets: 238 10*3/uL (ref 150–400)
RBC: 4.96 MIL/uL (ref 3.87–5.11)
RDW: 17.2 % — ABNORMAL HIGH (ref 11.5–15.5)
WBC: 6.4 10*3/uL (ref 4.0–10.5)
nRBC: 0 % (ref 0.0–0.2)

## 2019-08-08 LAB — BASIC METABOLIC PANEL
Anion gap: 13 (ref 5–15)
BUN: 32 mg/dL — ABNORMAL HIGH (ref 8–23)
CO2: 31 mmol/L (ref 22–32)
Calcium: 8.9 mg/dL (ref 8.9–10.3)
Chloride: 94 mmol/L — ABNORMAL LOW (ref 98–111)
Creatinine, Ser: 1.07 mg/dL — ABNORMAL HIGH (ref 0.44–1.00)
GFR calc Af Amer: 54 mL/min — ABNORMAL LOW (ref >60)
GFR calc non Af Amer: 47 mL/min — ABNORMAL LOW (ref >60)
Glucose, Bld: 154 mg/dL — ABNORMAL HIGH (ref 70–99)
Potassium: 4.7 mmol/L (ref 3.5–5.1)
Sodium: 138 mmol/L (ref 135–145)

## 2019-08-08 LAB — GLUCOSE, CAPILLARY
Glucose-Capillary: 117 mg/dL — ABNORMAL HIGH (ref 70–99)
Glucose-Capillary: 118 mg/dL — ABNORMAL HIGH (ref 70–99)
Glucose-Capillary: 119 mg/dL — ABNORMAL HIGH (ref 70–99)
Glucose-Capillary: 138 mg/dL — ABNORMAL HIGH (ref 70–99)
Glucose-Capillary: 148 mg/dL — ABNORMAL HIGH (ref 70–99)
Glucose-Capillary: 158 mg/dL — ABNORMAL HIGH (ref 70–99)
Glucose-Capillary: 178 mg/dL — ABNORMAL HIGH (ref 70–99)

## 2019-08-08 NOTE — Progress Notes (Signed)
Initial Nutrition Assessment  RD working remotely.  DOCUMENTATION CODES:   Not applicable  INTERVENTION:  - continue Ensure Enlive BID, each supplement provides 350 kcal and 20 grams of protein. - will order Magic Cup BID with meals, each supplement provides 290 kcal and 9 grams of protein. - will monitor for decisions following Palliative Care meeting today at 1400.   NUTRITION DIAGNOSIS:   Inadequate oral intake related to acute illness, lethargy/confusion as evidenced by meal completion < 50%.  GOAL:   Patient will meet greater than or equal to 90% of their needs  MONITOR:   PO intake, Supplement acceptance, Labs, Weight trends  REASON FOR ASSESSMENT:   Consult Assessment of nutrition requirement/status  ASSESSMENT:   86 year old female with history of dementia, osteoarthritis, IBS, hyperlipidemia, and A. fib. She presented to the ED due to nausea, insomnia, and shortness of breath.  She was admitted for hypoxic, hypercarbic respiratory failure 2/2 CHF exacerbation. Palliative Care is following.  Patient noted to be disoriented x4. She has been eating mainly 0-15% of meals over the past 5 days. Weight on admission on 6/26 was 114 lb and weight on 7/1 was 102 lb. PTA the most recently documented weight was on 05/02/16.   Notes indicate plan for Palliative Care meeting with husband/family today at 1400. She is DNR. MD note from yesterday stated that d/c plan is home vs SNF vs residential hospice.     Labs reviewed; CBGs: 178, 118, 117 mg/dl, Cl: 94 mmol/l, BUN: 32 mg/dl, creatinine: 1.07 mg/dl, GFR: 47 ml/min.  Medications reviewed; 20 mg IV lasix/day.      NUTRITION - FOCUSED PHYSICAL EXAM:  unable to complete at this time.   Diet Order:   Diet Order            DIET DYS 3 Room service appropriate? Yes; Fluid consistency: Thin  Diet effective now                 EDUCATION NEEDS:   No education needs have been identified at this time  Skin:  Skin  Assessment: Reviewed RN Assessment  Last BM:  unknown  Height:   Ht Readings from Last 1 Encounters:  08/05/19 5\' 2"  (1.575 m)    Weight:   Wt Readings from Last 1 Encounters:  08/06/19 46.1 kg     Estimated Nutritional Needs:  Kcal:  1300-1500 kcal Protein:  60-75 grams Fluid:  >/= 1.5 L/day     Jarome Matin, MS, RD, LDN, CNSC Inpatient Clinical Dietitian RD pager # available in [facility]  After hours/weekend pager # available in [facility]

## 2019-08-08 NOTE — Progress Notes (Signed)
PMT meeting scheduled with husband at 2:00 pm today.  Florentina Jenny, PA-C Palliative Medicine Office:  [phone]

## 2019-08-08 NOTE — Progress Notes (Signed)
Spouse at bedside feeding pt chocolate pudding. Pt is alert and will say her name.

## 2019-08-08 NOTE — Consult Note (Addendum)
Consultation Note Date: 08/08/2019   Patient Name: [name]  DOB: [birth year]  MRN: [id number]  Age / Sex: 86 y.o., female  PCP: Lawerance Cruel, MD Referring Physician: Shawna Clamp, MD  Reason for Consultation: Establishing goals of care  HPI/Patient Profile: 86 y.o. female  with past medical history of dementia with occasional delirium, hip fracture, CHF (EF 40 - 45%), paroxysmal afib, aortic stenosis, and osteoarthritis who was admitted on 08/01/2019 with insomnia, nausea and shortness of breath.  She was found to be in acute respiratory failure secondary to a heart failure exacerbation.  At the time of my consultation she has received six days of hospital therapy.  She is currently very lethargic, refusing all PO intake, and continues to have episodes of afib with RVR.  Clinical Assessment and Goals of Care:  I have reviewed medical records including EPIC notes, labs and imaging, received report from beside RN, examined the patient and met at bedside with [name] (husband)  to discuss diagnosis prognosis, GOC, EOL wishes, disposition and options.  I introduced Palliative Medicine as specialized medical care for people living with serious illness. It focuses on providing relief from the symptoms and stress of a serious illness.   We discussed a brief life review of the patient. Mr. And Mrs [name] have been married to 6 years and have 1 son who lives close by.  [name] takes care of his wife and checks in daily on his son.  He explains she has had dementia for at least the last 10 years.  At home he helps her with dressing and bathing.  She feeds herself and is able to ambulate some with a walker.  [name] states his wife never eats much.  I asked what she enjoys - he responds that she loves going to the beauty shop once a week (although it is hard to get her there), and watching the movie  channel.  We discussed her current illness and what it means in the larger context of her on-going co-morbidities.  Natural disease trajectory and expectations at EOL were discussed.  Specifically we discussed her heart failure, RVR, and advancing dementia.  She is likely at a new baseline as a result of this illness and hospitalization.  We discussed that she is likely not a candidate for rehab as she is unable to follow commands.   I mentioned to [name] that the biggest problem right now is her eating.  She is simply not eating for [place].  [name] states she was eating 3 days ago and that this has actually gotten worse in the hospital.  I explained that if she does not eat she will be a good candidate for inpatient hospice at [facility], and if she does eat she would be a good candidate to go home with hospice services.   She will likely be bed bound as she is now unable to walk.  [name] tells me he has been providing a lot of her care already  and would be happy if he could take her home with help from Hospice.  Hospice and Palliative Care services outpatient were explained and offered.  Questions and concerns were addressed.  The family was encouraged to call with questions or concerns.    Primary Decision Maker:  NEXT OF KIN husband    SUMMARY OF RECOMMENDATIONS    Continue current care for another 48 hours.  If patient begins to eat again she will be a good candidate to go home with Hospice services.  If patient does not eat enough to sustain herself we will request a bed for her at [facility].   Plan to meet with husband again on Monday to discuss which way we are going.  Husband reports she is very hard of hearing.  (Left ear is better).  Does not eat solid food at home - loves orange popsicles, pudding, ice cream.  Will change diet to full liquids  Code Status/Advance Care Planning:  DNR   Symptom Management:   Feeding assistance, delirium precautions.  Palliative  Prophylaxis:   Aspiration, Bowel Regimen, Delirium Protocol, Eye Care, Frequent Pain Assessment, Oral Care and Turn Reposition  Psycho-social/Spiritual:   Desire for further Chaplaincy support: welcomed.  Patient is [employer].  Prognosis:   If she does not increase PO intake likely two weeks or less.  If she does begin to take POs likely 6 months or less.    Discharge Planning: To Be Determined      Primary Diagnoses: Present on Admission: . Dyspnea . Dementia ([place]) . Acute respiratory failure with hypoxemia ([place]) . Acute hypoxemic respiratory failure ([place])   I have reviewed the medical record, interviewed the patient and family, and examined the patient. The following aspects are pertinent.  Past Medical History:  Diagnosis Date  . Allergy   . CHF (congestive heart failure) ([place])   . Dementia ([place])   . Fibromyalgia    jt pains, Tramadol [place] 0-4 per day   . Fractured hip ([place]) 10/2010   fractured left hip, L THR   . IBS (irritable bowel syndrome)    IBS-constipation  . On prednisone therapy 2008-2010   Continuous prednisone therapy   . Palpitations    paroxysmal atrial tachycardia, EF normal, diastolic dysfunction-echo/Holter 10/10 (she did not wish to take medications prescribed, metoprolol/HCTZ)  . PNA (pneumonia)    w/lung abscess at 59 months of age   Social History   Socioeconomic History  . Marital status: Married    Spouse name: Not on file  . Number of children: Not on file  . Years of education: Not on file  . Highest education level: Not on file  Occupational History  . Not on file  Tobacco Use  . Smoking status: Former Research scientist (life sciences)  . Smokeless tobacco: Never Used  Vaping Use  . Vaping Use: Never used  Substance and Sexual Activity  . Alcohol use: No  . Drug use: No  . Sexual activity: Never    Birth control/protection: None  Other Topics Concern  . Not on file  Social History Narrative  . Not on file   Social Determinants of Health    Financial Resource Strain:   . Difficulty of Paying Living Expenses:   Food Insecurity:   . Worried About Charity fundraiser in the Last Year:   . Arboriculturist in the Last Year:   Transportation Needs:   . Film/video editor (Medical):   Marland Kitchen Lack of Transportation (Non-Medical):  Physical Activity:   . Days of Exercise per Week:   . Minutes of Exercise per Session:   Stress:   . Feeling of Stress :   Social Connections:   . Frequency of Communication with Friends and Family:   . Frequency of Social Gatherings with Friends and Family:   . Attends Religious Services:   . Active Member of Clubs or Organizations:   . Attends Archivist Meetings:   Marland Kitchen Marital Status:    Family History  Problem Relation Age of Onset  . Heart attack Mother   . Lupus Paternal Grandmother     Allergies  Allergen Reactions  . Compazine [Prochlorperazine Edisylate]     could not talk or hold head up  . Demerol [Meperidine]     itch, nasal cong  . Dilaudid [Hydromorphone Hcl]     itch  . Novocain [Procaine]     heart races  . Other     "some antibiotic": muscle flexion Tetanus-Diphth-Acell Pertussis: left side numb      Vital Signs: BP (!) 104/57 (BP Location: Left Arm)   Pulse 97   Temp (!) 97.3 F (36.3 C) (Axillary)   Resp 19   Ht '5\' 2"'  (1.575 m)   Wt 46.1 kg   SpO2 100%   BMI 18.59 kg/m  Pain Scale: 0-10   Pain Score: 0-No pain   SpO2: SpO2: 100 % O2 Device:SpO2: 100 % O2 Flow Rate: .O2 Flow Rate (L/min): 4 L/min    Palliative Assessment/Data: 20%     Time In: 1:50 Time Out: 3:00 Time Total: 70 min. Visit consisted of counseling and education dealing with the complex and emotionally intense issues surrounding the need for palliative care and symptom management in the setting of serious and potentially life-threatening illness. Greater than 50%  of this time was spent counseling and coordinating care related to the above assessment and plan.  Signed  by: Florentina Jenny, PA-C Palliative Medicine  Please contact Palliative Medicine Team phone at [phone] for questions and concerns.  For individual provider: See Shea Evans

## 2019-08-08 NOTE — Evaluation (Addendum)
Physical Therapy Evaluation Patient Details Name: [name] MRN: [id number] DOB: 07/12/1930 Today's Date: 08/08/2019   History of Present Illness  86 yo female admitted with acute respiratory failure, CHF exac, A flutter. Hx of dementia, OA, Afib, aortic stenosis  Clinical Impression  Bed level eval only. Pt kept eyes closed for most of session. She did not respond to any questions (unsure if she was able to hear me or not). She does move her UEs at will. When I removed one mitten, she gestured for me to remove the other one. Performed UE and LE bed exercises with multimodal cueing and some assistance from therapist. No family present during session. Will follow and progress activity as able. Per chart, there is a palliative meeting pending.     Follow Up Recommendations SNF;Supervision/Assistance - 24 hour (depending on progress/family decision-palliative meeting pending)    Equipment Recommendations   (continuing to assess)    Recommendations for Other Services       Precautions / Restrictions Precautions Precautions: Fall Restrictions Weight Bearing Restrictions: No      Mobility  Bed Mobility               General bed mobility comments: NT-pt too lethargic  Transfers                    Ambulation/Gait                Stairs            Wheelchair Mobility    Modified Rankin (Stroke Patients Only)       Balance                                             Pertinent Vitals/Pain Pain Assessment: Faces Faces Pain Scale: No hurt    Home Living Family/patient expects to be discharged to:: Unsure Living Arrangements: Spouse/significant other                    Prior Function           Comments: pt unable to provide info-lethargic     Hand Dominance        Extremity/Trunk Assessment   Upper Extremity Assessment Upper Extremity Assessment: Difficult to assess due to impaired cognition    Lower  Extremity Assessment Lower Extremity Assessment: Difficult to assess due to impaired cognition       Communication   Communication: HOH  Cognition Arousal/Alertness: Lethargic Behavior During Therapy: WFL for tasks assessed/performed Overall Cognitive Status: Difficult to assess                                        General Comments      Exercises General Exercises - Upper Extremity Shoulder Flexion: AAROM;Both;5 reps;Supine Elbow Flexion: AAROM;Supine;Both;5 reps General Exercises - Lower Extremity Heel Slides: AAROM;Both;5 reps;Supine   Assessment/Plan    PT Assessment Patient needs continued PT services  PT Problem List Decreased mobility;Decreased strength;Decreased activity tolerance;Decreased cognition;Decreased balance       PT Treatment Interventions Therapeutic activities;Therapeutic exercise;Patient/family education;Functional mobility training;Balance training;Gait training;DME instruction    PT Goals (Current goals can be found in the Care Plan section)  Acute Rehab PT Goals PT Goal Formulation: Patient unable to participate in goal setting  Time For Goal Achievement: 08/22/19 Potential to Achieve Goals: Fair    Frequency Min 2X/week   Barriers to discharge        Co-evaluation               AM-PAC PT "6 Clicks" Mobility  Outcome Measure Help needed turning from your back to your side while in a flat bed without using bedrails?: Total Help needed moving from lying on your back to sitting on the side of a flat bed without using bedrails?: Total Help needed moving to and from a bed to a chair (including a wheelchair)?: Total Help needed standing up from a chair using your arms (e.g., wheelchair or bedside chair)?: Total Help needed to walk in hospital room?: Total Help needed climbing 3-5 steps with a railing? : Total 6 Click Score: 6    End of Session   Activity Tolerance: Patient limited by lethargy Patient left: in bed;with  call bell/phone within reach;with bed alarm set   PT Visit Diagnosis: Muscle weakness (generalized) (M62.81)    Time: 3254-9826 PT Time Calculation (min) (ACUTE ONLY): 8 min   Charges:   PT Evaluation $PT Eval Low Complexity: [place], PT Acute Rehabilitation  Office: [phone] Pager: [phone]

## 2019-08-08 NOTE — Progress Notes (Signed)
PROGRESS NOTE    [name]  STM:[id number] DOB: 1930-02-17 DOA: 08/01/2019   PCP: Lawerance Cruel, MD   Brief Narrative: Patient is 86 year old female with history of dementia, osteoarthritis, IBS, hyperlipidemia, paroxysmal A. fib not on anticoagulation who was brought to emergency department with complaints of nausea, insomnia, shortness of breath.  She was admitted for hypoxic, hypercarbic respiratory failure secondary to CHF exacerbation.  Hospital course remarkable for atrial flutter with RVR, poor oral intake, delirium.  Cardiology  following.  Due to her advanced age and multiple comorbidities, poor oral intake, we have requested for palliative care evaluation.   Assessment & Plan:   Principal Problem:   Acute respiratory failure with hypoxemia (HCC) Active Problems:   Dyspnea   Dementia (HCC)   Atrial flutter (HCC)   Acute hypoxemic respiratory failure (HCC)  Acute hypoxic/hypercarbic respiratory failure: Most likely secondary to acute congestive heart failure exacerbation.  Currently on 1-2 L/min. we will continue to wean the oxygen.  Chest x-ray showed cardiomegaly.  Continue bronchodilators, incentive spirometer, flutter valve.  Acute systolic heart failure: Echocardiogram showed ejection fraction of 40 to 45%.  On IV Lasix 20 mg daily.  Also started on losartan and metoprolol.  Patient's family doesnt  want diagnostic cath. Monitor daily weight, ins and out.  A flutter with RVR: Continue metoprolol at current dose.  Cardizem  discontinued as per cardiology due to low ejection fraction.  Not a candidate for anticoagulation due to falls.  On digoxin and metoprolol.  She still has episodes of RVR from time to time..  Continue to monitor in telemetry  Dysphagia: Has poor oral intake.  Continue to monitor blood sugars.  Had an hypoglycemic episode. Currently on dysphagia 3 diet.  Hyperkalemia: Resolved.  Became hypokalemic, supplemented and corrected  Dementia with  behavioral disturbances/encephalopathy: She had frequent episodes of  Agitation/delirium during this hospitalization.  She has been very sleepy / lethargic since last 2 days.  Seroquel has been discontinued .  Avoid sedating medications as much as possible. At baseline she has memory problems and gets confused on and off as per the husband.  Aortic stenosis: Cardiology recommended outpatient follow-up.  Generalized weakness/debility/deconditioning: We have requested physical therapy evaluation. Most likely she will need skilled nursing facility on discharge.  Goals of care: Due to her advanced age and multiple comorbidities, poor oral intake, we have requested for palliative care evaluation.She might be a candidate for residential hospice if she does not improve. It was discussed in detail with the husband on phone.  Husband wants the patient to be DNR,  and willing to talk with palliative care.   DVT prophylaxis: Lovenox Code Status: DNR Family Communication: Discussed with husband on phone on 08/07/19 Status is: Inpatient  Remains inpatient appropriate because:Hemodynamically unstable   Dispo: The patient is from: Home  Anticipated d/c is to: Home vs SNF vs Residential Hospice  Anticipated d/c date is: 2 days  Patient currently is not medically stable to d/c.    Consultants:   Cardiology  Procedures:  None.  Antimicrobials:  Anti-infectives (From admission, onward)   None      Subjective: Patient was seen and examined at bedside.  Heart rate is fluctuating between 100-115.  She still appears lethargic opens her eyes on calling her name and tries to speak.  Currently she is on 2 L supplemental oxygen via nasal cannula.  She continues to have poor p.o. intake.  Objective: Vitals:   08/08/19 0800 08/08/19 0819 08/08/19 0900 08/08/19  1042  BP: (!) 104/57   (!) 133/59  Pulse:    (!) 101  Resp: 19     Temp:   98.4 F (36.9 C)     TempSrc:   Oral   SpO2: 100% 100%    Weight:      Height:        Intake/Output Summary (Last 24 hours) at 08/08/2019 1122 Last data filed at 08/08/2019 0500 Gross per 24 hour  Intake 358.21 ml  Output 300 ml  Net 58.21 ml   Filed Weights   08/04/19 0500 08/05/19 0455 08/06/19 0303  Weight: 48.5 kg 45.6 kg 46.1 kg    Examination:  General exam: Deconditioned, debilitated, lethargic, frail, chronically ill looking Respiratory system: No wheezing or crackles,  respiratory effort normal. Cardiovascular system: S1 & S2 heard, irregular rhythm. No JVD, murmurs, rubs, gallops or clicks. No pedal edema. Gastrointestinal system: Abdomen is nondistended, soft and nontender. No organomegaly or masses felt. Normal bowel sounds heard. Central nervous system: Alert but not oriented , no focal neurological deficits. Extremities: No edema, no clubbing no cyanosis. Skin: No rashes, lesions or ulcers Psychiatry: Not assessed.   Data Reviewed: I have personally reviewed following labs and imaging studies  CBC: Recent Labs  Lab 08/01/19 1225 08/01/19 1823 08/02/19 0436 08/03/19 0431 08/04/19 0238 08/05/19 0223 08/08/19 0246  WBC 6.4   < > 6.1 7.2 8.1 7.9 6.4  NEUTROABS 3.9  --   --   --   --   --  4.6  HGB 11.4*   < > 11.4* 11.2* 11.0* 12.2 12.8  HCT 37.9   < > 38.8 36.8 36.2 39.9 45.1  MCV 88.1   < > 89.0 86.6 86.4 84.9 90.9  PLT 280   < > 255 263 259 240 238   < > = values in this interval not displayed.   Basic Metabolic Panel: Recent Labs  Lab 08/02/19 0436 08/02/19 0436 08/03/19 0431 08/03/19 0431 08/04/19 0238 08/04/19 0238 08/05/19 0223 08/06/19 0206 08/07/19 0224 08/07/19 1326 08/08/19 0246  NA 133*   < > 134*   < > 135   < > 139 132* 138 141 138  K 4.6   < > 4.7   < > 4.1   < > 3.4* 4.6 5.1 4.6 4.7  CL 101   < > 99   < > 96*   < > 93* 94* 97* 96* 94*  CO2 23   < > 24   < > 29   < > 29 29 25  32 31  GLUCOSE 83   < > 87   < > 116*   < > 120* 103* 86 100* 154*  BUN  13   < > 14   < > 13   < > 11 14 21  27* 32*  CREATININE 0.76   < > 0.76   < > 0.91   < > 0.78 0.86 0.96 0.96 1.07*  CALCIUM 8.4*   < > 8.7*   < > 8.4*   < > 8.6* 8.2* 8.7* 9.2 8.9  MG 2.0  --  2.0  --  1.9  --  1.9  --   --   --   --    < > = values in this interval not displayed.   GFR: Estimated Creatinine Clearance: 27.5 mL/min (A) (by C-G formula based on SCr of 1.07 mg/dL (H)). Liver Function Tests: Recent Labs  Lab 08/01/19 1225  AST 41  ALT 13  ALKPHOS 60  BILITOT 1.4*  PROT 7.2  ALBUMIN 3.5   No results for input(s): LIPASE, AMYLASE in the last 168 hours. No results for input(s): AMMONIA in the last 168 hours. Coagulation Profile: No results for input(s): INR, PROTIME in the last 168 hours. Cardiac Enzymes: No results for input(s): CKTOTAL, CKMB, CKMBINDEX, TROPONINI in the last 168 hours. BNP (last 3 results) No results for input(s): PROBNP in the last 8760 hours. HbA1C: No results for input(s): HGBA1C in the last 72 hours. CBG: Recent Labs  Lab 08/07/19 1911 08/07/19 2325 08/08/19 0023 08/08/19 0348 08/08/19 0803  GLUCAP 96 63* 178* 118* 117*   Lipid Profile: No results for input(s): CHOL, HDL, LDLCALC, TRIG, CHOLHDL, LDLDIRECT in the last 72 hours. Thyroid Function Tests: No results for input(s): TSH, T4TOTAL, FREET4, T3FREE, THYROIDAB in the last 72 hours. Anemia Panel: No results for input(s): VITAMINB12, FOLATE, FERRITIN, TIBC, IRON, RETICCTPCT in the last 72 hours. Sepsis Labs: Recent Labs  Lab 08/01/19 1230 08/01/19 1823  PROCALCITON  --  <0.10  LATICACIDVEN 1.8 1.5    Recent Results (from the past 240 hour(s))  Culture, blood (routine x 2)     Status: None   Collection Time: 08/01/19 10:57 AM   Specimen: BLOOD  Result Value Ref Range Status   Specimen Description   Final    BLOOD RIGHT ANTECUBITAL Performed at [facility] [address] [place], [place] [address]    Special Requests   Final    BOTTLES DRAWN AEROBIC  AND ANAEROBIC Blood Culture results may not be optimal due to an inadequate volume of blood received in culture bottles Performed at [facility] [address], [place], [place] [address]    Culture   Final    NO GROWTH 5 DAYS Performed at [facility], [place] [address], [place], [place] [address]    Report Status 08/06/2019 FINAL  Final  Culture, blood (routine x 2)     Status: None   Collection Time: 08/01/19 12:30 PM   Specimen: BLOOD  Result Value Ref Range Status   Specimen Description   Final    BLOOD LEFT HAND Performed at [facility] [address], [place], [place] [address]    Special Requests   Final    BOTTLES DRAWN AEROBIC AND ANAEROBIC Blood Culture adequate volume Performed at [facility] [address] [place], [place] [address]    Culture   Final    NO GROWTH 5 DAYS Performed at [facility], [place] [address], [place], [place] [address]    Report Status 08/06/2019 FINAL  Final  SARS Coronavirus 2 by RT PCR (hospital order, performed in [facility] hospital lab) Nasopharyngeal Nasopharyngeal Swab     Status: None   Collection Time: 08/01/19 12:30 PM   Specimen: Nasopharyngeal Swab  Result Value Ref Range Status   SARS Coronavirus 2 NEGATIVE NEGATIVE Final    Comment: (NOTE) SARS-CoV-2 target nucleic acids are NOT DETECTED.  The SARS-CoV-2 RNA is generally detectable in upper and lower respiratory specimens during the acute phase of infection. The lowest concentration of SARS-CoV-2 viral copies this assay can detect is 250 copies / mL. A negative result does not preclude SARS-CoV-2 infection and should not be used as the sole basis for treatment or other patient management decisions.  A negative result may occur with improper specimen collection / handling, submission of specimen other than nasopharyngeal swab, presence of viral mutation(s) within  the areas targeted by this assay, and  inadequate number of viral copies (<250 copies / mL). A negative result must be combined with clinical observations, patient history, and epidemiological information.  Fact Sheet for Patients:   StrictlyIdeas.no  Fact Sheet for Healthcare Providers: BankingDealers.co.za  This test is not yet approved or  cleared by the [place] FDA and has been authorized for detection and/or diagnosis of SARS-CoV-2 by FDA under an Emergency Use Authorization (EUA).  This EUA will remain in effect (meaning this test can be used) for the duration of the COVID-19 declaration under Section 564(b)(1) of the Act, 21 U.S.C. section 360bbb-3(b)(1), unless the authorization is terminated or revoked sooner.  Performed at [facility], [place] [address] [place], [place] [address]   MRSA PCR Screening     Status: None   Collection Time: 08/04/19  1:35 AM   Specimen: Nasopharyngeal Wash  Result Value Ref Range Status   MRSA by PCR NEGATIVE NEGATIVE Final    Comment:        The GeneXpert MRSA Assay (FDA approved for NASAL specimens only), is one component of a comprehensive MRSA colonization surveillance program. It is not intended to diagnose MRSA infection nor to guide or monitor treatment for MRSA infections. Performed at [facility], [place] [address], [place], [place] [address]      Radiology Studies: No results found.   Scheduled Meds: . aspirin EC  81 mg Oral Daily  . Chlorhexidine Gluconate Cloth  6 each Topical Daily  . digoxin  0.125 mg Oral Daily  . feeding supplement (ENSURE ENLIVE)  237 mL Oral BID BM  . furosemide  20 mg Intravenous Daily  . losartan  12.5 mg Oral Daily  . mouth rinse  15 mL Mouth Rinse BID  . metoprolol tartrate  50 mg Oral BID   Continuous Infusions:   LOS: 6 days    Time spent: 35 mins   Chelsea Pedretti, MD [facility]   If 7PM-7AM, please contact  night-coverage

## 2019-08-09 DIAGNOSIS — F0280 Dementia in other diseases classified elsewhere without behavioral disturbance: Secondary | ICD-10-CM

## 2019-08-09 LAB — COMPREHENSIVE METABOLIC PANEL
ALT: 13 U/L (ref 0–44)
AST: 32 U/L (ref 15–41)
Albumin: 3.4 g/dL — ABNORMAL LOW (ref 3.5–5.0)
Alkaline Phosphatase: 55 U/L (ref 38–126)
Anion gap: 12 (ref 5–15)
BUN: 26 mg/dL — ABNORMAL HIGH (ref 8–23)
CO2: 34 mmol/L — ABNORMAL HIGH (ref 22–32)
Calcium: 8.9 mg/dL (ref 8.9–10.3)
Chloride: 93 mmol/L — ABNORMAL LOW (ref 98–111)
Creatinine, Ser: 0.8 mg/dL (ref 0.44–1.00)
GFR calc Af Amer: >60 mL/min (ref >60)
GFR calc non Af Amer: >60 mL/min (ref >60)
Glucose, Bld: 110 mg/dL — ABNORMAL HIGH (ref 70–99)
Potassium: 3.7 mmol/L (ref 3.5–5.1)
Sodium: 139 mmol/L (ref 135–145)
Total Bilirubin: 0.8 mg/dL (ref 0.3–1.2)
Total Protein: 6.9 g/dL (ref 6.5–8.1)

## 2019-08-09 LAB — CBC
HCT: 40.8 % (ref 36.0–46.0)
Hemoglobin: 12.3 g/dL (ref 12.0–15.0)
MCH: 26.6 pg (ref 26.0–34.0)
MCHC: 30.1 g/dL (ref 30.0–36.0)
MCV: 88.1 fL (ref 80.0–100.0)
Platelets: 250 10*3/uL (ref 150–400)
RBC: 4.63 MIL/uL (ref 3.87–5.11)
RDW: 17 % — ABNORMAL HIGH (ref 11.5–15.5)
WBC: 9.1 10*3/uL (ref 4.0–10.5)
nRBC: 0 % (ref 0.0–0.2)

## 2019-08-09 LAB — MAGNESIUM: Magnesium: 1.9 mg/dL (ref 1.7–2.4)

## 2019-08-09 LAB — GLUCOSE, CAPILLARY
Glucose-Capillary: 99 mg/dL (ref 70–99)
Glucose-Capillary: 98 mg/dL (ref 70–99)

## 2019-08-09 LAB — PHOSPHORUS: Phosphorus: 1.1 mg/dL — ABNORMAL LOW (ref 2.5–4.6)

## 2019-08-09 MED ORDER — SODIUM PHOSPHATES 45 MMOLE/15ML IV SOLN: 30.0000 mmol | Freq: Once | INTRAVENOUS | Status: DC

## 2019-08-09 MED ORDER — POTASSIUM PHOSPHATES 15 MMOLE/5ML IV SOLN
30.0000 mmol | Freq: Once | INTRAVENOUS | Status: AC
  Administered 2019-08-09: 30 mmol via INTRAVENOUS
  Filled 2019-08-09: qty 10

## 2019-08-09 NOTE — Progress Notes (Signed)
5mg  IV metoprolol prn given for increased HR of 138

## 2019-08-09 NOTE — Progress Notes (Signed)
PROGRESS NOTE    CALINA PATRIE  KDT:[id number] DOB: 05-05-1930 DOA: 08/01/2019   PCP: Lawerance Cruel, MD   Brief Narrative: Patient is 86 year old female with history of dementia, osteoarthritis, IBS, hyperlipidemia, paroxysmal A. fib not on anticoagulation who was brought to emergency department with complaints of nausea, insomnia, shortness of breath.  She was admitted for hypoxic, hypercarbic respiratory failure secondary to CHF exacerbation.  Hospital course remarkable for atrial flutter with RVR, poor oral intake, delirium.  Cardiology  following.  Due to her advanced age and multiple comorbidities, poor oral intake, we have requested for palliative care evaluation.  Family meeting was arranged,  Plan: if patient improves and begins to eat again she will be a good candidate to go home with hospice services, if patient does not eat or does not improve patient can be discharged to [facility].   Assessment & Plan:   Principal Problem:   Acute respiratory failure with hypoxemia [facility]) Active Problems:   Dyspnea   Dementia (HCC)   Atrial flutter (HCC)   Acute hypoxemic respiratory failure (HCC)   Palliative care encounter  Acute hypoxic/hypercarbic respiratory failure: Most likely secondary to acute congestive heart failure exacerbation.  Currently on 1-2 L/min. we will continue to wean the oxygen.  Chest x-ray showed cardiomegaly.  Continue bronchodilators, incentive spirometer, flutter valve.  Acute systolic heart failure: Echocardiogram showed ejection fraction of 40 to 45%.  On IV Lasix 20 mg daily.  Also started on losartan and metoprolol.  Patient's family doesnt  want diagnostic cath. Monitor daily weight, ins and out.  A flutter with RVR: Continue metoprolol at current dose.  Cardizem  discontinued as per cardiology due to low ejection fraction.  Not a candidate for anticoagulation due to falls.  On digoxin and metoprolol.  She still has episodes of RVR from time to time..   Continue to monitor in telemetry  Dysphagia: Has poor oral intake.  Continue to monitor blood sugars.  Had an hypoglycemic episode. Currently on dysphagia 3 diet.  Hyperkalemia: Resolved.  Became hypokalemic, supplemented and corrected  Dementia with behavioral disturbances/encephalopathy: She had frequent episodes of  Agitation/delirium during this hospitalization.  She has been very sleepy / lethargic in last 2 days.  Seroquel has been discontinued .  Avoid sedating medications as much as possible. At baseline she has memory problems and gets confused on and off as per the husband.  Aortic stenosis: Cardiology recommended outpatient follow-up.  Generalized weakness/debility/deconditioning: We have requested physical therapy evaluation. Most likely she will need skilled nursing facility on discharge.  Goals of care: Due to her advanced age and multiple comorbidities, poor oral intake, we have requested for palliative care evaluation.She might be a candidate for residential hospice if she does not improve. It was discussed in detail with the husband on phone.  Husband wants the patient to be DNR,  and willing to talk with palliative care. Family meeting was arranged,  Plan: if patient improves and begins to eat again she will be a good candidate to go home with hospice services, if patient does not eat or does not improve patient can be discharged to [facility].   DVT prophylaxis: Lovenox Code Status: DNR Family Communication: Discussed with husband on phone on 08/07/19 Status is: Inpatient  Remains inpatient appropriate because:Hemodynamically unstable   Dispo: The patient is from: Home  Anticipated d/c is to: Home vs SNF vs Residential Hospice  Anticipated d/c date is: 2 days  Patient currently is not medically stable to  d/c.   Consultants:   Cardiology  Procedures:  None.  Antimicrobials:  Anti-infectives (From admission, onward)    None      Subjective: Patient was seen and examined at bedside.  No overnight events.  Patient is more alert and awake today, hard of hearing, heart rate is fluctuating between 100-115. Currently she is on 2 L supplemental oxygen via nasal cannula.  She continues to have poor p.o. intake.  Objective: Vitals:   08/09/19 0400 08/09/19 0600 08/09/19 0800 08/09/19 1000  BP:  102/71 106/74 (!) 110/58  Pulse:  (!) 104 96   Resp:  18 (!) 28   Temp: 97.8 F (36.6 C)  (!) 97.3 F (36.3 C)   TempSrc: Oral  Axillary   SpO2:  98% (!) 71%   Weight:      Height:        Intake/Output Summary (Last 24 hours) at 08/09/2019 1053 Last data filed at 08/08/2019 1600 Gross per 24 hour  Intake 120 ml  Output --  Net 120 ml   Filed Weights   08/04/19 0500 08/05/19 0455 08/06/19 0303  Weight: 48.5 kg 45.6 kg 46.1 kg    Examination:  General exam: Deconditioned, debilitated, more alert and awake,  frail, chronically ill looking Respiratory system: No wheezing or crackles,  respiratory effort normal. Cardiovascular system: S1 & S2 heard, irregular rhythm. No JVD, murmurs, rubs, gallops or clicks. No pedal edema. Gastrointestinal system: Abdomen is nondistended, soft and nontender. No organomegaly or masses felt. Normal bowel sounds heard. Central nervous system: Alert but not oriented , no focal neurological deficits. Extremities: No edema, no clubbing no cyanosis. Skin: No rashes, lesions or ulcers Psychiatry: Not assessed.   Data Reviewed: I have personally reviewed following labs and imaging studies  CBC: Recent Labs  Lab 08/03/19 0431 08/04/19 0238 08/05/19 0223 08/08/19 0246 08/09/19 0259  WBC 7.2 8.1 7.9 6.4 9.1  NEUTROABS  --   --   --  4.6  --   HGB 11.2* 11.0* 12.2 12.8 12.3  HCT 36.8 36.2 39.9 45.1 40.8  MCV 86.6 86.4 84.9 90.9 88.1  PLT 263 259 240 238 562   Basic Metabolic Panel: Recent Labs  Lab 08/03/19 0431 08/03/19 0431 08/04/19 0238 08/04/19 0238 08/05/19 0223  08/05/19 0223 08/06/19 0206 08/07/19 0224 08/07/19 1326 08/08/19 0246 08/09/19 0259  NA 134*   < > 135   < > 139   < > 132* 138 141 138 139  K 4.7   < > 4.1   < > 3.4*   < > 4.6 5.1 4.6 4.7 3.7  CL 99   < > 96*   < > 93*   < > 94* 97* 96* 94* 93*  CO2 24   < > 29   < > 29   < > 29 25 32 31 34*  GLUCOSE 87   < > 116*   < > 120*   < > 103* 86 100* 154* 110*  BUN 14   < > 13   < > 11   < > 14 21 27* 32* 26*  CREATININE 0.76   < > 0.91   < > 0.78   < > 0.86 0.96 0.96 1.07* 0.80  CALCIUM 8.7*   < > 8.4*   < > 8.6*   < > 8.2* 8.7* 9.2 8.9 8.9  MG 2.0  --  1.9  --  1.9  --   --   --   --   --  1.9  PHOS  --   --   --   --   --   --   --   --   --   --  1.1*   < > = values in this interval not displayed.   GFR: Estimated Creatinine Clearance: 36.7 mL/min (by C-G formula based on SCr of 0.8 mg/dL). Liver Function Tests: Recent Labs  Lab 08/09/19 0259  AST 32  ALT 13  ALKPHOS 55  BILITOT 0.8  PROT 6.9  ALBUMIN 3.4*   No results for input(s): LIPASE, AMYLASE in the last 168 hours. No results for input(s): AMMONIA in the last 168 hours. Coagulation Profile: No results for input(s): INR, PROTIME in the last 168 hours. Cardiac Enzymes: No results for input(s): CKTOTAL, CKMB, CKMBINDEX, TROPONINI in the last 168 hours. BNP (last 3 results) No results for input(s): PROBNP in the last 8760 hours. HbA1C: No results for input(s): HGBA1C in the last 72 hours. CBG: Recent Labs  Lab 08/08/19 1153 08/08/19 1647 08/08/19 1916 08/08/19 2323 08/09/19 0335  GLUCAP 138* 148* 119* 158* 99   Lipid Profile: No results for input(s): CHOL, HDL, LDLCALC, TRIG, CHOLHDL, LDLDIRECT in the last 72 hours. Thyroid Function Tests: No results for input(s): TSH, T4TOTAL, FREET4, T3FREE, THYROIDAB in the last 72 hours. Anemia Panel: No results for input(s): VITAMINB12, FOLATE, FERRITIN, TIBC, IRON, RETICCTPCT in the last 72 hours. Sepsis Labs: No results for input(s): PROCALCITON, LATICACIDVEN in the  last 168 hours.  Recent Results (from the past 240 hour(s))  Culture, blood (routine x 2)     Status: None   Collection Time: 08/01/19 10:57 AM   Specimen: BLOOD  Result Value Ref Range Status   Specimen Description   Final    BLOOD RIGHT ANTECUBITAL Performed at [facility] [address] [place], [place] [address]    Special Requests   Final    BOTTLES DRAWN AEROBIC AND ANAEROBIC Blood Culture results may not be optimal due to an inadequate volume of blood received in culture bottles Performed at [facility] [address] [place], [place] [address]    Culture   Final    NO GROWTH 5 DAYS Performed at [facility], [place] [address], [place], [place] [address]    Report Status 08/06/2019 FINAL  Final  Culture, blood (routine x 2)     Status: None   Collection Time: 08/01/19 12:30 PM   Specimen: BLOOD  Result Value Ref Range Status   Specimen Description   Final    BLOOD LEFT HAND Performed at [facility] [address] [place], [place] [address]    Special Requests   Final    BOTTLES DRAWN AEROBIC AND ANAEROBIC Blood Culture adequate volume Performed at [facility] [address], [place], [place] [address]    Culture   Final    NO GROWTH 5 DAYS Performed at [facility], [place] [address], [place],  [address]    Report Status 08/06/2019 FINAL  Final  SARS Coronavirus 2 by RT PCR (hospital order, performed in [facility] hospital lab) Nasopharyngeal Nasopharyngeal Swab     Status: None   Collection Time: 08/01/19 12:30 PM   Specimen: Nasopharyngeal Swab  Result Value Ref Range Status   SARS Coronavirus 2 NEGATIVE NEGATIVE Final    Comment: (NOTE) SARS-CoV-2 target nucleic acids are NOT DETECTED.  The SARS-CoV-2 RNA is generally detectable in upper and lower respiratory specimens during  the acute phase of infection. The lowest concentration of SARS-CoV-2 viral copies this  assay can detect is 250 copies / mL. A negative result does not preclude SARS-CoV-2 infection and should not be used as the sole basis for treatment or other patient management decisions.  A negative result may occur with improper specimen collection / handling, submission of specimen other than nasopharyngeal swab, presence of viral mutation(s) within the areas targeted by this assay, and inadequate number of viral copies (<250 copies / mL). A negative result must be combined with clinical observations, patient history, and epidemiological information.  Fact Sheet for Patients:   StrictlyIdeas.no  Fact Sheet for Healthcare Providers: BankingDealers.co.za  This test is not yet approved or  cleared by the [place] FDA and has been authorized for detection and/or diagnosis of SARS-CoV-2 by FDA under an Emergency Use Authorization (EUA).  This EUA will remain in effect (meaning this test can be used) for the duration of the COVID-19 declaration under Section 564(b)(1) of the Act, 21 U.S.C. section 360bbb-3(b)(1), unless the authorization is terminated or revoked sooner.  Performed at [facility], [place] [address], [place], [place] [address]   MRSA PCR Screening     Status: None   Collection Time: 08/04/19  1:35 AM   Specimen: Nasopharyngeal Wash  Result Value Ref Range Status   MRSA by PCR NEGATIVE NEGATIVE Final    Comment:        The GeneXpert MRSA Assay (FDA approved for NASAL specimens only), is one component of a comprehensive MRSA colonization surveillance program. It is not intended to diagnose MRSA infection nor to guide or monitor treatment for MRSA infections. Performed at [facility], [place] [address], [place], [place] [address]      Radiology Studies: No results found.   Scheduled Meds: . aspirin EC  81 mg Oral Daily  . Chlorhexidine Gluconate Cloth  6 each Topical Daily   . digoxin  0.125 mg Oral Daily  . feeding supplement (ENSURE ENLIVE)  237 mL Oral BID BM  . furosemide  20 mg Intravenous Daily  . losartan  12.5 mg Oral Daily  . mouth rinse  15 mL Mouth Rinse BID  . metoprolol tartrate  50 mg Oral BID   Continuous Infusions: . potassium PHOSPHATE IVPB (in mmol) 30 mmol (08/09/19 1015)     LOS: 7 days    Time spent: 35 mins   Hussein Macdougal, MD [facility]   If 7PM-7AM, please contact night-coverage

## 2019-08-09 NOTE — Progress Notes (Signed)
Pt will not let this RN or NT check her blood sugars , pt gets very agitated and yells not now. MD aware. Pt refuses PO medication. Pt will eat orange popsicle family brought from home.

## 2019-08-09 NOTE — Progress Notes (Signed)
                                                                                                                                                                                                         Daily Progress Note   Patient Name: [name]       Date: 08/09/2019 DOB: 02/25/1930  Age: 86 y.o. MRN#: [id number] Attending Physician: Kumar, Pardeep, MD Primary Care Physician: Ross, Charles Alan, MD Admit Date: 08/01/2019  Reason for Consultation/Follow-up: To discuss complex medical decision making related to patient's goals of care  Subjective: Met with husband at bedside.  Patient appears much more alert.  She is eating an orange cream sickle and asking who the person is on TV.   Husband tells me patient has eaten a box of orange cream sickles since yesterday.    I suggested that she would do well with hospice at home.  Husband was pleased and felt the extra support would be very helpful at home.   Assessment: Patient with advanced dementia, uncontrolled afib with RVR, and CHF exacerbation.  Still in ICU with RVR but appears improved overall.   Patient Profile/HPI:  86 y.o. female  with past medical history of dementia with occasional delirium, hip fracture, CHF (EF 40 - 45%), paroxysmal afib, aortic stenosis, and osteoarthritis who was admitted on 08/01/2019 with insomnia, nausea and shortness of breath.  She was found to be in acute respiratory failure secondary to a heart failure exacerbation.  At the time of my consultation she has received six days of hospital therapy.  She is currently very lethargic, refusing all PO intake, and continues to have episodes of afib with RVR.    Length of Stay: 7   Vital Signs: BP 103/78 (BP Location: Left Arm)   Pulse (!) 104   Temp 98 F (36.7 C) (Axillary)   Resp 15   Ht 5' 2" (1.575 m)   Wt 46.1 kg   SpO2 (!) 88%   BMI 18.59 kg/m  SpO2: SpO2: (!) 88 % O2 Device: O2 Device: Room Air O2 Flow Rate: O2 Flow Rate (L/min): 4 L/min        Palliative Assessment/Data: 30%     Palliative Care Plan    Recommendations/Plan:  When medically optimized DC to home with [facility].    Continue all maintenance medications on discharge.  Code Status:  DNR  Prognosis:   < 6 months advanced dementia, poor PO intake, bed bound, refractory afib with RVR   Discharge Planning:  Home with Hospice  Care plan   was discussed with husband  Thank you for allowing the Palliative Medicine Team to assist in the care of this patient.  Total time spent:  25 min.     Greater than 50%  of this time was spent counseling and coordinating care related to the above assessment and plan.   , PA-C Palliative Medicine  Please contact Palliative MedicineTeam phone at [phone] for questions and concerns between 7 am - 7 pm.   Please see AMION for individual provider pager numbers.      

## 2019-08-10 LAB — BASIC METABOLIC PANEL
Anion gap: 12 (ref 5–15)
BUN: 22 mg/dL (ref 8–23)
CO2: 35 mmol/L — ABNORMAL HIGH (ref 22–32)
Calcium: 9 mg/dL (ref 8.9–10.3)
Chloride: 90 mmol/L — ABNORMAL LOW (ref 98–111)
Creatinine, Ser: 0.81 mg/dL (ref 0.44–1.00)
GFR calc Af Amer: >60 mL/min (ref >60)
GFR calc non Af Amer: >60 mL/min (ref >60)
Glucose, Bld: 97 mg/dL (ref 70–99)
Potassium: 3.5 mmol/L (ref 3.5–5.1)
Sodium: 137 mmol/L (ref 135–145)

## 2019-08-10 LAB — GLUCOSE, CAPILLARY: Glucose-Capillary: 120 mg/dL — ABNORMAL HIGH (ref 70–99)

## 2019-08-10 LAB — PHOSPHORUS: Phosphorus: 2.6 mg/dL (ref 2.5–4.6)

## 2019-08-10 LAB — MAGNESIUM: Magnesium: 1.7 mg/dL (ref 1.7–2.4)

## 2019-08-10 MED ORDER — LOSARTAN POTASSIUM 25 MG PO TABS: 12.5000 mg | ORAL_TABLET | Freq: Every day | ORAL | 2 refills | Status: DC

## 2019-08-10 MED ORDER — METOPROLOL TARTRATE 50 MG PO TABS: 50.0000 mg | ORAL_TABLET | Freq: Two times a day (BID) | ORAL | 1 refills | Status: DC

## 2019-08-10 MED ORDER — DIGOXIN 125 MCG PO TABS: 0.1250 mg | ORAL_TABLET | Freq: Every day | ORAL | 2 refills | Status: DC

## 2019-08-10 NOTE — Discharge Summary (Signed)
Physician Discharge Summary  [name] ZOX:[id number] DOB: 04/15/1930 DOA: 08/01/2019  PCP: Lawerance Cruel, MD  Admit date: 08/01/2019   Discharge date: 08/10/2019  Admitted From: Home Disposition:  Residential hospice  Recommendations for Outpatient Follow-up:  1. Follow up with PCP in 1-2 weeks 2. Please obtain BMP/CBC in one week 3. Patient is being discharged to residential hospice.  Home Health: Yes  Equipment/Devices: None  Discharge Condition: Fair CODE STATUS:DNR Diet recommendation: Heart Healthy / Carb Modified / Regular / Dysphagia   Brief Summary/ Hospital course : Patient is 86 year old female with history of Alzheimer's dementia, osteoarthritis, IBS, hyperlipidemia, paroxysmal A. fib not on anticoagulation who was brought to emergency department with complaints of nausea, insomnia, shortness of breath. She was admitted for hypoxic, hypercarbic respiratory failure secondary to CHF exacerbation. Hospital course remarkable for atrial flutter with RVR, poor oral intake, delirium. Cardiology following.Due to her advanced age and multiple comorbidities, poor oral intake, we have requested for palliative care evaluation.  Family meeting was arranged,  Plan: if patient improves and begins to eat again she will be a good candidate to go home with hospice services, if patient does not eat or does not improve patient can be discharged to [facility].Patient more alert and started eating. she would do well with hospice at home. Patient is being discharged.  Lisinopril was discontinued and transition to losartan.  Metoprolol was increased to 50 mg p.o. twice daily  Discharge Diagnoses:  Principal Problem:   Acute respiratory failure with hypoxemia [facility]) Active Problems:   Dyspnea   Dementia (HCC)   Atrial flutter (HCC)   Acute hypoxemic respiratory failure ([place])   Palliative care encounter  She was managed for below problems.  Acute hypoxic/hypercarbic respiratory  failure: Most likely secondary to acute congestive heart failure exacerbation. She was on 1-2L/min. Weaned down to room air. Chest x-ray showed cardiomegaly. Continue bronchodilators, incentive spirometer, flutter valve.  Acute systolic heart failure:Echocardiogram showed ejection fraction of 40 to 45%. On IV Lasix 20 mg daily. Also started on losartan and metoprolol. Patient's family doesnt want diagnostic cath. Monitor daily weight, ins and out.  A flutter with RVR: Continue metoprolol at current dose. Cardizem discontinued as per cardiology due to low ejection fraction. Not a candidate for anticoagulation due to falls. On digoxin and metoprolol. She still has episodes ofRVR from time to time.. Continue to monitor in telemetry  Dysphagia: Has poor oral intake. Continue to monitor blood sugars. Had an hypoglycemic episode. Currently on dysphagia 3 diet.  Hyperkalemia:Resolved. Became hypokalemic, supplemented and corrected  Dementia with behavioral disturbances/encephalopathy:She had frequentepisodes ofAgitation/delirium during this hospitalization.She has beenvery sleepy / lethargic in last 2 days. Seroquel has been discontinued .Avoidsedating medications as much as possible. At baseline she has memory problems and gets confused on and offas per the husband.  Aortic stenosis: Cardiology recommended outpatient follow-up.  Generalized weakness/debility/deconditioning:We have requested physical therapy evaluation. Most likely she will need skilled nursing facility on discharge.  Goals of care: Due to her advanced age and multiple comorbidities, poor oral intake, we have requested for palliative care evaluation.She might be a candidate for residential hospiceif she does not improve. It was discussed in detail with the husband on phone.  Husband wants the patient to be DNR, and willing to talk with palliative care. Family meeting was arranged,  Plan: if patient  improves and begins to eat again she will be a good candidate to go home with hospice services, if patient does not eat or does  not improve patient can be discharged to [facility]. Patient would benefit from hospice at home.   Discharge Instructions  Discharge Instructions    Call MD for:  difficulty breathing, headache or visual disturbances   Complete by: As directed    Call MD for:  persistant dizziness or light-headedness   Complete by: As directed    Call MD for:  persistant nausea and vomiting   Complete by: As directed    Call MD for:  temperature >100.4   Complete by: As directed    Diet - low sodium heart healthy   Complete by: As directed    Discharge instructions   Complete by: As directed    Advised to follow-up with primary care physician in 1 week. Advised to follow-up with cardiology as scheduled.   Increase activity slowly   Complete by: As directed      Allergies as of 08/10/2019      Reactions   Compazine [prochlorperazine Edisylate]    could not talk or hold head up   Demerol [meperidine]    itch, nasal cong   Dilaudid [hydromorphone Hcl]    itch   Novocain [procaine]    heart races   Other    "some antibiotic": muscle flexion Tetanus-Diphth-Acell Pertussis: left side numb      Medication List    STOP taking these medications   lisinopril 2.5 MG tablet Commonly known as: ZESTRIL     TAKE these medications   aspirin 81 MG EC tablet Take 1 tablet (81 mg total) by mouth daily.   digoxin 0.125 MG tablet Commonly known as: LANOXIN Take 1 tablet (0.125 mg total) by mouth daily. Start taking on: August 11, 2019   furosemide 20 MG tablet Commonly known as: LASIX Take 1 tablet (20 mg total) by mouth daily.   hydrOXYzine 25 MG tablet Commonly known as: ATARAX/VISTARIL Take 25 mg by mouth 3 (three) times daily as needed for anxiety. HydrOXYzine HCl 25 Tablet 1/2 to 1 tablet Three times a day Orally 30 days   losartan 25 MG tablet Commonly known  as: COZAAR Take 0.5 tablets (12.5 mg total) by mouth daily. Start taking on: August 11, 2019   metoprolol tartrate 50 MG tablet Commonly known as: LOPRESSOR Take 1 tablet (50 mg total) by mouth 2 (two) times daily. What changed:   medication strength  how much to take   potassium chloride SA 20 MEQ tablet Commonly known as: KLOR-CON Take 1 tablet (20 mEq total) by mouth daily.       Follow-up Information    Lawerance Cruel, MD Follow up in 1 week(s).   Specialty: Family Medicine Contact information: [address] [place] [address] [phone]              Allergies  Allergen Reactions  . Compazine [Prochlorperazine Edisylate]     could not talk or hold head up  . Demerol [Meperidine]     itch, nasal cong  . Dilaudid [Hydromorphone Hcl]     itch  . Novocain [Procaine]     heart races  . Other     "some antibiotic": muscle flexion Tetanus-Diphth-Acell Pertussis: left side numb    Consultations:  Cardiology, Palliative care.   Procedures/Studies: DG Chest Port 1 View  Result Date: 08/01/2019 CLINICAL DATA:  Hypoxia. EXAM: PORTABLE CHEST 1 VIEW COMPARISON:  08/07/2014 FINDINGS: Examination demonstrates mild volume loss of the left lung with stable mild elevation of the left hemidiaphragm. Minimal left base opacification  which is chronic/stable. Lungs are otherwise clear. Mild stable cardiomegaly. Remainder of the exam is unchanged. IMPRESSION: 1. No acute findings. Stable left basilar changes and mild stable volume loss of the left lung. 2.  Mild cardiomegaly. Electronically Signed   By: Marin Olp M.D.   On: 08/01/2019 11:33   ECHOCARDIOGRAM COMPLETE  Result Date: 08/02/2019    ECHOCARDIOGRAM REPORT   Patient Name:   CHARLIZE HATHAWAY Sowash Date of Exam: 08/02/2019 Medical Rec #:  [id number]       Height:       62.0 in Accession #:    [id number]      Weight:       114.4 lb Date of Birth:  06/11/1930        BSA:          1.508 m Patient Age:    51 years         BP:           108/61 mmHg Patient Gender: F               HR:           94 bpm. Exam Location:  Inpatient Procedure: 2D Echo, Cardiac Doppler and Color Doppler Indications:    I42.9 Cardiomyopathy (unspecified)  History:        Patient has prior history of Echocardiogram examinations, most                 recent 07/30/2014. CHF.  Sonographer:    Jonelle Sidle Dance Referring Phys: [id number] ANKIT CHIRAG AMIN IMPRESSIONS  1. Left ventricular ejection fraction, by estimation, is 40 to 45%. The left ventricle has mildly decreased function. The left ventricle demonstrates global hypokinesis. There is moderate left ventricular hypertrophy. Left ventricular diastolic parameters are indeterminate.  2. Right ventricular systolic function is mildly reduced. The right ventricular size is normal. There is normal pulmonary artery systolic pressure. The estimated right ventricular systolic pressure is 76.5 mmHg.  3. Left atrial size was mildly dilated.  4. The mitral valve is abnormal, mildly thickened and calcified with somewhat restricted posterior leaflet. Mild mitral valve regurgitation.  5. Tricuspid valve regurgitation is moderate.  6. The aortic valve is tricuspid, severely calcified with decreased cusp excursion. Aortic valve regurgitation is trivial. Probable severe, low gradient aortic valve stenosis (although pseudostenosis with reduced LVEF also possible). Aortic valve area, by VTI measures 0.66 cm. Aortic valve mean gradient measures 6.5 mmHg. Aortic valve Vmax measures 1.66 m/s. Dimentionless index 0.29.  7. The inferior vena cava is normal in size with <50% respiratory variability, suggesting right atrial pressure of 8 mmHg. FINDINGS  Left Ventricle: Left ventricular ejection fraction, by estimation, is 40 to 45%. The left ventricle has mildly decreased function. The left ventricle demonstrates global hypokinesis. The left ventricular internal cavity size was normal in size. There is  moderate left ventricular  hypertrophy. Left ventricular diastolic parameters are indeterminate. Right Ventricle: The right ventricular size is normal. No increase in right ventricular wall thickness. Right ventricular systolic function is mildly reduced. There is normal pulmonary artery systolic pressure. The tricuspid regurgitant velocity is 2.57 m/s, and with an assumed right atrial pressure of 8 mmHg, the estimated right ventricular systolic pressure is 46.5 mmHg. Left Atrium: Left atrial size was mildly dilated. Right Atrium: Right atrial size was normal in size. Pericardium: There is no evidence of pericardial effusion. Mitral Valve: The mitral valve is abnormal. There is mild thickening of the mitral valve leaflet(s). There is mild calcification  of the mitral valve leaflet(s). Mild mitral valve regurgitation. Tricuspid Valve: The tricuspid valve is grossly normal. Tricuspid valve regurgitation is moderate. Aortic Valve: The aortic valve is tricuspid. Aortic valve regurgitation is trivial. Severe aortic stenosis is present. Moderate aortic valve annular calcification. There is severe calcifcation of the aortic valve. Aortic valve mean gradient measures 6.5 mmHg. Aortic valve peak gradient measures 11.1 mmHg. Aortic valve area, by VTI measures 0.66 cm. Pulmonic Valve: The pulmonic valve was grossly normal. Pulmonic valve regurgitation is trivial. Aorta: The aortic root is normal in size and structure. Venous: The inferior vena cava is normal in size with less than 50% respiratory variability, suggesting right atrial pressure of 8 mmHg. IAS/Shunts: No atrial level shunt detected by color flow Doppler.  LEFT VENTRICLE PLAX 2D LVIDd:         3.50 cm LVIDs:         2.50 cm LV PW:         1.50 cm LV IVS:        1.30 cm LVOT diam:     1.70 cm LV SV:         20 LV SV Index:   13 LVOT Area:     2.27 cm  RIGHT VENTRICLE          IVC RV Basal diam:  3.10 cm  IVC diam: 2.10 cm RV Mid diam:    1.70 cm TAPSE (M-mode): 0.6 cm LEFT ATRIUM              Index       RIGHT ATRIUM           Index LA diam:        3.50 cm 2.32 cm/m  RA Area:     20.70 cm LA Vol (A2C):   68.6 ml 45.50 ml/m RA Volume:   59.00 ml  39.13 ml/m LA Vol (A4C):   22.0 ml 14.59 ml/m LA Biplane Vol: 40.1 ml 26.60 ml/m  AORTIC VALVE AV Area (Vmax):    0.69 cm AV Area (Vmean):   0.62 cm AV Area (VTI):     0.66 cm AV Vmax:           166.50 cm/s AV Vmean:          117.500 cm/s AV VTI:            0.302 m AV Peak Grad:      11.1 mmHg AV Mean Grad:      6.5 mmHg LVOT Vmax:         50.50 cm/s LVOT Vmean:        32.300 cm/s LVOT VTI:          0.088 m LVOT/AV VTI ratio: 0.29  AORTA Ao Root diam: 3.00 cm Ao Asc diam:  2.90 cm MITRAL VALVE               TRICUSPID VALVE MV Area (PHT): 3.32 cm    TR Peak grad:   26.4 mmHg MV Decel Time: 229 msec    TR Vmax:        257.00 cm/s MV E velocity: 84.25 cm/s                            SHUNTS                            Systemic VTI:  0.09 m  Systemic Diam: 1.70 cm Rozann Lesches MD Electronically signed by Rozann Lesches MD Signature Date/Time: 08/02/2019/12:45:50 PM    Final     Echocardiogram.   Subjective: Patient was seen and examined at bedside.  Heart rate is fluctuating between 100-115.  She is on room air saturating 98%.  She is more alert, awake,  hard of hearing on the left side.  She started eating better. Discharge Exam: Vitals:   08/10/19 0800 08/10/19 1200  BP: 114/78   Pulse: 86   Resp: 12   Temp: (!) 96 F (35.6 C) (!) 96.9 F (36.1 C)  SpO2: 99% 99%   Vitals:   08/10/19 0000 08/10/19 0339 08/10/19 0800 08/10/19 1200  BP: 113/75  114/78   Pulse:   86   Resp: (!) 21  12   Temp: (!) 96.4 F (35.8 C) 98.2 F (36.8 C) (!) 96 F (35.6 C) (!) 96.9 F (36.1 C)  TempSrc: Axillary Axillary Axillary Axillary  SpO2:   99% 99%  Weight:      Height:        General: Pt is alert, awake, not in acute distress Cardiovascular: RRR, S1/S2 +, no rubs, no gallops Respiratory: CTA bilaterally, no  wheezing, no rhonchi Abdominal: Soft, NT, ND, bowel sounds + Extremities: no edema, no cyanosis    The results of significant diagnostics from this hospitalization (including imaging, microbiology, ancillary and laboratory) are listed below for reference.     Microbiology: Recent Results (from the past 240 hour(s))  Culture, blood (routine x 2)     Status: None   Collection Time: 08/01/19 10:57 AM   Specimen: BLOOD  Result Value Ref Range Status   Specimen Description   Final    BLOOD RIGHT ANTECUBITAL Performed at [facility] [address], [place], [place] [address]    Special Requests   Final    BOTTLES DRAWN AEROBIC AND ANAEROBIC Blood Culture results may not be optimal due to an inadequate volume of blood received in culture bottles Performed at [facility] [address], [place], [place] [address]    Culture   Final    NO GROWTH 5 DAYS Performed at [facility], [place] [address], [place], [place] [address]    Report Status 08/06/2019 FINAL  Final  Culture, blood (routine x 2)     Status: None   Collection Time: 08/01/19 12:30 PM   Specimen: BLOOD  Result Value Ref Range Status   Specimen Description   Final    BLOOD LEFT HAND Performed at [facility] [address], [place], [place] [address]    Special Requests   Final    BOTTLES DRAWN AEROBIC AND ANAEROBIC Blood Culture adequate volume Performed at [facility] [address], [place], [place] [address]    Culture   Final    NO GROWTH 5 DAYS Performed at [facility], [place] [address], [place], [place] [address]    Report Status 08/06/2019 FINAL  Final  SARS Coronavirus 2 by RT PCR (hospital order, performed in [facility] hospital lab) Nasopharyngeal Nasopharyngeal Swab     Status: None   Collection Time: 08/01/19 12:30 PM   Specimen: Nasopharyngeal Swab  Result Value Ref Range Status   SARS Coronavirus 2 NEGATIVE  NEGATIVE Final    Comment: (NOTE) SARS-CoV-2 target nucleic acids are NOT DETECTED.  The SARS-CoV-2 RNA is generally detectable in upper and lower respiratory specimens during the acute phase  of infection. The lowest concentration of SARS-CoV-2 viral copies this assay can detect is 250 copies / mL. A negative result does not preclude SARS-CoV-2 infection and should not be used as the sole basis for treatment or other patient management decisions.  A negative result may occur with improper specimen collection / handling, submission of specimen other than nasopharyngeal swab, presence of viral mutation(s) within the areas targeted by this assay, and inadequate number of viral copies (<250 copies / mL). A negative result must be combined with clinical observations, patient history, and epidemiological information.  Fact Sheet for Patients:   StrictlyIdeas.no  Fact Sheet for Healthcare Providers: BankingDealers.co.za  This test is not yet approved or  cleared by the [place] FDA and has been authorized for detection and/or diagnosis of SARS-CoV-2 by FDA under an Emergency Use Authorization (EUA).  This EUA will remain in effect (meaning this test can be used) for the duration of the COVID-19 declaration under Section 564(b)(1) of the Act, 21 U.S.C. section 360bbb-3(b)(1), unless the authorization is terminated or revoked sooner.  Performed at [facility], [place] [address], [place], [place] [address]   MRSA PCR Screening     Status: None   Collection Time: 08/04/19  1:35 AM   Specimen: Nasopharyngeal Wash  Result Value Ref Range Status   MRSA by PCR NEGATIVE NEGATIVE Final    Comment:        The GeneXpert MRSA Assay (FDA approved for NASAL specimens only), is one component of a comprehensive MRSA colonization surveillance program. It is not intended to diagnose MRSA infection nor to guide or monitor  treatment for MRSA infections. Performed at [facility], [place] [address], [place], [place] [address]      Labs: BNP (last 3 results) Recent Labs    08/01/19 1823  BNP 854.6*   Basic Metabolic Panel: Recent Labs  Lab 08/04/19 0238 08/04/19 0238 08/05/19 0223 08/06/19 0206 08/07/19 0224 08/07/19 1326 08/08/19 0246 08/09/19 0259 08/10/19 0320  NA 135   < > 139   < > 138 141 138 139 137  K 4.1   < > 3.4*   < > 5.1 4.6 4.7 3.7 3.5  CL 96*   < > 93*   < > 97* 96* 94* 93* 90*  CO2 29   < > 29   < > 25 32 31 34* 35*  GLUCOSE 116*   < > 120*   < > 86 100* 154* 110* 97  BUN 13   < > 11   < > 21 27* 32* 26* 22  CREATININE 0.91   < > 0.78   < > 0.96 0.96 1.07* 0.80 0.81  CALCIUM 8.4*   < > 8.6*   < > 8.7* 9.2 8.9 8.9 9.0  MG 1.9  --  1.9  --   --   --   --  1.9 1.7  PHOS  --   --   --   --   --   --   --  1.1* 2.6   < > = values in this interval not displayed.   Liver Function Tests: Recent Labs  Lab 08/09/19 0259  AST 32  ALT 13  ALKPHOS 55  BILITOT 0.8  PROT 6.9  ALBUMIN 3.4*   No results for input(s): LIPASE, AMYLASE in the last 168 hours. No results for input(s): AMMONIA in the last 168 hours. CBC: Recent Labs  Lab 08/04/19 0238 08/05/19 0223 08/08/19 0246 08/09/19  0259  WBC 8.1 7.9 6.4 9.1  NEUTROABS  --   --  4.6  --   HGB 11.0* 12.2 12.8 12.3  HCT 36.2 39.9 45.1 40.8  MCV 86.4 84.9 90.9 88.1  PLT 259 240 238 250   Cardiac Enzymes: No results for input(s): CKTOTAL, CKMB, CKMBINDEX, TROPONINI in the last 168 hours. BNP: Invalid input(s): POCBNP CBG: Recent Labs  Lab 08/08/19 1916 08/08/19 2323 08/09/19 0335 08/09/19 1959 08/09/19 2313  GLUCAP 119* 158* 99 98 120*   D-Dimer No results for input(s): DDIMER in the last 72 hours. Hgb A1c No results for input(s): HGBA1C in the last 72 hours. Lipid Profile No results for input(s): CHOL, HDL, LDLCALC, TRIG, CHOLHDL, LDLDIRECT in the last 72 hours. Thyroid function studies No  results for input(s): TSH, T4TOTAL, T3FREE, THYROIDAB in the last 72 hours.  Invalid input(s): FREET3 Anemia work up No results for input(s): VITAMINB12, FOLATE, FERRITIN, TIBC, IRON, RETICCTPCT in the last 72 hours. Urinalysis    Component Value Date/Time   COLORURINE YELLOW 08/01/2019 1052   APPEARANCEUR CLEAR 08/01/2019 1052   LABSPEC 1.015 08/01/2019 1052   PHURINE 5.0 08/01/2019 1052   GLUCOSEU NEGATIVE 08/01/2019 1052   [place] 08/01/2019 [place] 08/01/2019 [place] 08/01/2019 1052   PROTEINUR NEGATIVE 08/01/2019 1052   UROBILINOGEN 0.2 08/05/2014 0415   NITRITE NEGATIVE 08/01/2019 1052   LEUKOCYTESUR NEGATIVE 08/01/2019 1052   Sepsis Labs Invalid input(s): PROCALCITONIN,  WBC,  LACTICIDVEN Microbiology Recent Results (from the past 240 hour(s))  Culture, blood (routine x 2)     Status: None   Collection Time: 08/01/19 10:57 AM   Specimen: BLOOD  Result Value Ref Range Status   Specimen Description   Final    BLOOD RIGHT ANTECUBITAL Performed at [facility], [place] [address], [place], [place] [address]    Special Requests   Final    BOTTLES DRAWN AEROBIC AND ANAEROBIC Blood Culture results may not be optimal due to an inadequate volume of blood received in culture bottles Performed at [facility] [address], [place], [place] [address]    Culture   Final    NO GROWTH 5 DAYS Performed at [facility], [place] [address] [place], [place] [address]    Report Status 08/06/2019 FINAL  Final  Culture, blood (routine x 2)     Status: None   Collection Time: 08/01/19 12:30 PM   Specimen: BLOOD  Result Value Ref Range Status   Specimen Description   Final    BLOOD LEFT HAND Performed at [facility] [address], [place], [place] [address]    Special Requests   Final    BOTTLES DRAWN AEROBIC AND ANAEROBIC Blood Culture adequate volume Performed at [facility] [address] [place], [place] [address]    Culture   Final    NO GROWTH 5 DAYS Performed at [facility], [place] [address] [place], [place] [address]    Report Status 08/06/2019 FINAL  Final  SARS Coronavirus 2 by RT PCR (hospital order, performed in [facility] hospital lab) Nasopharyngeal Nasopharyngeal Swab     Status: None   Collection Time: 08/01/19 12:30 PM   Specimen: Nasopharyngeal Swab  Result Value Ref Range Status   SARS Coronavirus 2 NEGATIVE NEGATIVE Final    Comment: (NOTE) SARS-CoV-2 target nucleic acids are NOT DETECTED.  The SARS-CoV-2 RNA is generally detectable in upper and  lower respiratory specimens during the acute phase of infection. The lowest concentration of SARS-CoV-2 viral copies this assay can detect is 250 copies / mL. A negative result does not preclude SARS-CoV-2 infection and should not be used as the sole basis for treatment or other patient management decisions.  A negative result may occur with improper specimen collection / handling, submission of specimen other than nasopharyngeal swab, presence of viral mutation(s) within the areas targeted by this assay, and inadequate number of viral copies (<250 copies / mL). A negative result must be combined with clinical observations, patient history, and epidemiological information.  Fact Sheet for Patients:   StrictlyIdeas.no  Fact Sheet for Healthcare Providers: BankingDealers.co.za  This test is not yet approved or  cleared by the [place] FDA and has been authorized for detection and/or diagnosis of SARS-CoV-2 by FDA under an Emergency Use Authorization (EUA).  This EUA will remain in effect (meaning this test can be used) for the duration of the COVID-19 declaration under Section 564(b)(1) of the Act, 21 U.S.C. section 360bbb-3(b)(1), unless the authorization is terminated or revoked sooner.  Performed at [facility], [place] [address] [place], [place] [address]   MRSA PCR Screening     Status: None   Collection Time: 08/04/19  1:35 AM   Specimen: Nasopharyngeal Wash  Result Value Ref Range Status   MRSA by PCR NEGATIVE NEGATIVE Final    Comment:        The GeneXpert MRSA Assay (FDA approved for NASAL specimens only), is one component of a comprehensive MRSA colonization surveillance program. It is not intended to diagnose MRSA infection nor to guide or monitor treatment for MRSA infections. Performed at [facility], [place] [address] [place], [place] [address]      Time coordinating discharge: Over 30 minutes  SIGNED:   Shawna Clamp, MD  [facility] 08/10/2019, 2:31 PM Pager   If 7PM-7AM, please contact night-coverage [email or web address]

## 2019-08-10 NOTE — Progress Notes (Signed)
Discharge instructions provided to husband. Hopsice to follow up with husband. IV removed. PTAR departed with patient at 61.

## 2019-08-10 NOTE — Progress Notes (Signed)
Manufacturing engineer Documentation      Liaison received referral for pt to return home s/p discharge with hospice services.     Writer contacted pt's husband to confirm interest. Explained hospice services and philosophy and answered all questions. Husband agrees to hospice services and stated that pt does not have any DME needs.    Pt's chart in reviewed by hospice MD to determine pt is eligible. We are happy to arrange an admission visit on day of discharge or following day.    Please do not hesitate to outreach with any questions and thank you for the referral.     Freddie Breech, RN  [facility] Liaison  [phone]

## 2019-08-10 NOTE — Progress Notes (Signed)
Pt refusing medications, fingersticks, and food. RN tried several different times. Pt was only agreeable to popscicles. MD made aware. RN continuing to reassess. Pt appears comfortable at this time.

## 2019-08-10 NOTE — TOC Transition Note (Addendum)
Transition of Care [facility]) - CM/SW Discharge Note   Patient Details  Name: [name] MRN: [id number] Date of Birth: 1931/01/12  [facility]) CM/SW Contact:  Leeroy Cha, RN Phone Number: 08/10/2019, 10:46 AM   Clinical Narrative:    tct-Jen Love with [facility].  Will call husband today to make arrangements for dc on [id number] or [phone] Message to [place] love that patient will be dcd today. Dr. Dwyane Dee is aware.  tct-Mr. Fabio Neighbors that is ok please send home via ambulance. ptar called for transport/packet to floor rn   Barriers to Discharge: Continued Medical Work up   Patient Goals and CMS Choice Patient states their goals for this hospitalization and ongoing recovery are:: transferred to icu [id number]      Discharge Placement                       Discharge Plan and Services   Discharge Planning Services: CM Consult                                 Social Determinants of Health (SDOH) Interventions     Readmission Risk Interventions No flowsheet data found.

## 2019-08-10 NOTE — Discharge Instructions (Signed)
Patient has been discharged to home with hospice. Advised to follow-up with primary care physician. 10 was discharged on metoprolol 50 mg p.o. twice daily and losartan 12.5 mg p.o. daily.

## 2019-08-11 ENCOUNTER — Telehealth: Payer: Self-pay

## 2019-08-11 ENCOUNTER — Ambulatory Visit: Payer: Medicare Other | Admitting: Cardiology

## 2019-08-11 ENCOUNTER — Encounter (HOSPITAL_COMMUNITY): Payer: Self-pay

## 2019-08-11 ENCOUNTER — Emergency Department (HOSPITAL_COMMUNITY): Payer: Medicare Other

## 2019-08-11 ENCOUNTER — Inpatient Hospital Stay (HOSPITAL_COMMUNITY)
Admission: EM | Admit: 2019-08-11 | Discharge: 2019-08-13 | DRG: 309 | Disposition: A | Discharge status: Hospice | Payer: Medicare Other | Attending: Internal Medicine | Admitting: Internal Medicine

## 2019-08-11 DIAGNOSIS — Z03818 Encounter for observation for suspected exposure to other biological agents ruled out: Secondary | ICD-10-CM | POA: Diagnosis not present

## 2019-08-11 DIAGNOSIS — E875 Hyperkalemia: Secondary | ICD-10-CM | POA: Diagnosis present

## 2019-08-11 DIAGNOSIS — R1310 Dysphagia, unspecified: Secondary | ICD-10-CM | POA: Diagnosis present

## 2019-08-11 DIAGNOSIS — Z887 Allergy status to serum and vaccine status: Secondary | ICD-10-CM | POA: Diagnosis not present

## 2019-08-11 DIAGNOSIS — M797 Fibromyalgia: Secondary | ICD-10-CM | POA: Diagnosis present

## 2019-08-11 DIAGNOSIS — Z885 Allergy status to narcotic agent status: Secondary | ICD-10-CM | POA: Diagnosis not present

## 2019-08-11 DIAGNOSIS — Z902 Acquired absence of lung [part of]: Secondary | ICD-10-CM | POA: Diagnosis not present

## 2019-08-11 DIAGNOSIS — R54 Age-related physical debility: Secondary | ICD-10-CM | POA: Diagnosis present

## 2019-08-11 DIAGNOSIS — R000 Tachycardia, unspecified: Secondary | ICD-10-CM | POA: Diagnosis not present

## 2019-08-11 DIAGNOSIS — Z884 Allergy status to anesthetic agent status: Secondary | ICD-10-CM | POA: Diagnosis not present

## 2019-08-11 DIAGNOSIS — Z8249 Family history of ischemic heart disease and other diseases of the circulatory system: Secondary | ICD-10-CM

## 2019-08-11 DIAGNOSIS — I350 Nonrheumatic aortic (valve) stenosis: Secondary | ICD-10-CM

## 2019-08-11 DIAGNOSIS — Z20822 Contact with and (suspected) exposure to covid-19: Secondary | ICD-10-CM | POA: Diagnosis present

## 2019-08-11 DIAGNOSIS — H9190 Unspecified hearing loss, unspecified ear: Secondary | ICD-10-CM | POA: Diagnosis present

## 2019-08-11 DIAGNOSIS — Z881 Allergy status to other antibiotic agents status: Secondary | ICD-10-CM

## 2019-08-11 DIAGNOSIS — I4891 Unspecified atrial fibrillation: Secondary | ICD-10-CM | POA: Diagnosis not present

## 2019-08-11 DIAGNOSIS — Z888 Allergy status to other drugs, medicaments and biological substances status: Secondary | ICD-10-CM

## 2019-08-11 DIAGNOSIS — Z66 Do not resuscitate: Secondary | ICD-10-CM | POA: Diagnosis present

## 2019-08-11 DIAGNOSIS — Z7189 Other specified counseling: Secondary | ICD-10-CM | POA: Diagnosis not present

## 2019-08-11 DIAGNOSIS — F0280 Dementia in other diseases classified elsewhere without behavioral disturbance: Secondary | ICD-10-CM | POA: Diagnosis present

## 2019-08-11 DIAGNOSIS — I5042 Chronic combined systolic (congestive) and diastolic (congestive) heart failure: Secondary | ICD-10-CM | POA: Diagnosis present

## 2019-08-11 DIAGNOSIS — R0902 Hypoxemia: Secondary | ICD-10-CM | POA: Diagnosis not present

## 2019-08-11 DIAGNOSIS — F419 Anxiety disorder, unspecified: Secondary | ICD-10-CM | POA: Diagnosis present

## 2019-08-11 DIAGNOSIS — R778 Other specified abnormalities of plasma proteins: Secondary | ICD-10-CM | POA: Diagnosis present

## 2019-08-11 DIAGNOSIS — Z7982 Long term (current) use of aspirin: Secondary | ICD-10-CM

## 2019-08-11 DIAGNOSIS — I4892 Unspecified atrial flutter: Secondary | ICD-10-CM | POA: Diagnosis present

## 2019-08-11 DIAGNOSIS — R5381 Other malaise: Secondary | ICD-10-CM | POA: Diagnosis not present

## 2019-08-11 DIAGNOSIS — Z7401 Bed confinement status: Secondary | ICD-10-CM | POA: Diagnosis not present

## 2019-08-11 DIAGNOSIS — R4020 Unspecified coma: Secondary | ICD-10-CM | POA: Diagnosis not present

## 2019-08-11 DIAGNOSIS — Z515 Encounter for palliative care: Secondary | ICD-10-CM | POA: Diagnosis not present

## 2019-08-11 DIAGNOSIS — Z9049 Acquired absence of other specified parts of digestive tract: Secondary | ICD-10-CM | POA: Diagnosis not present

## 2019-08-11 DIAGNOSIS — I480 Paroxysmal atrial fibrillation: Secondary | ICD-10-CM | POA: Diagnosis present

## 2019-08-11 DIAGNOSIS — Z87891 Personal history of nicotine dependence: Secondary | ICD-10-CM

## 2019-08-11 DIAGNOSIS — M2550 Pain in unspecified joint: Secondary | ICD-10-CM | POA: Diagnosis not present

## 2019-08-11 DIAGNOSIS — Z79899 Other long term (current) drug therapy: Secondary | ICD-10-CM

## 2019-08-11 LAB — COMPREHENSIVE METABOLIC PANEL
ALT: 20 U/L (ref 0–44)
AST: 51 U/L — ABNORMAL HIGH (ref 15–41)
Albumin: 3.4 g/dL — ABNORMAL LOW (ref 3.5–5.0)
Alkaline Phosphatase: 66 U/L (ref 38–126)
Anion gap: 15 (ref 5–15)
BUN: 25 mg/dL — ABNORMAL HIGH (ref 8–23)
CO2: 33 mmol/L — ABNORMAL HIGH (ref 22–32)
Calcium: 9.2 mg/dL (ref 8.9–10.3)
Chloride: 92 mmol/L — ABNORMAL LOW (ref 98–111)
Creatinine, Ser: 0.93 mg/dL (ref 0.44–1.00)
GFR calc Af Amer: >60 mL/min (ref >60)
GFR calc non Af Amer: 56 mL/min — ABNORMAL LOW (ref >60)
Glucose, Bld: 89 mg/dL (ref 70–99)
Potassium: 5.2 mmol/L — ABNORMAL HIGH (ref 3.5–5.1)
Sodium: 140 mmol/L (ref 135–145)
Total Bilirubin: 2.4 mg/dL — ABNORMAL HIGH (ref 0.3–1.2)
Total Protein: 7 g/dL (ref 6.5–8.1)

## 2019-08-11 LAB — CBC WITH DIFFERENTIAL/PLATELET
Abs Immature Granulocytes: 0.05 10*3/uL (ref 0.00–0.07)
Basophils Absolute: 0.2 10*3/uL — ABNORMAL HIGH (ref 0.0–0.1)
Basophils Relative: 2 %
Eosinophils Absolute: 0.1 10*3/uL (ref 0.0–0.5)
Eosinophils Relative: 1 %
HCT: 46.9 % — ABNORMAL HIGH (ref 36.0–46.0)
Hemoglobin: 14 g/dL (ref 12.0–15.0)
Immature Granulocytes: 1 %
Lymphocytes Relative: 15 %
Lymphs Abs: 1.3 10*3/uL (ref 0.7–4.0)
MCH: 25.4 pg — ABNORMAL LOW (ref 26.0–34.0)
MCHC: 29.9 g/dL — ABNORMAL LOW (ref 30.0–36.0)
MCV: 85.1 fL (ref 80.0–100.0)
Monocytes Absolute: 1.4 10*3/uL — ABNORMAL HIGH (ref 0.1–1.0)
Monocytes Relative: 16 %
Neutro Abs: 5.9 10*3/uL (ref 1.7–7.7)
Neutrophils Relative %: 65 %
Platelets: 273 10*3/uL (ref 150–400)
RBC: 5.51 MIL/uL — ABNORMAL HIGH (ref 3.87–5.11)
RDW: 18.6 % — ABNORMAL HIGH (ref 11.5–15.5)
WBC: 9 10*3/uL (ref 4.0–10.5)
nRBC: 0 % (ref 0.0–0.2)

## 2019-08-11 LAB — URINALYSIS, ROUTINE W REFLEX MICROSCOPIC
Bacteria, UA: NONE SEEN
Bilirubin Urine: NEGATIVE
Glucose, UA: NEGATIVE mg/dL
Hgb urine dipstick: NEGATIVE
Ketones, ur: 20 mg/dL — AB
Leukocytes,Ua: NEGATIVE
Nitrite: NEGATIVE
Protein, ur: NEGATIVE mg/dL
Specific Gravity, Urine: 1.019 (ref 1.005–1.030)
pH: 6 (ref 5.0–8.0)

## 2019-08-11 LAB — TROPONIN I (HIGH SENSITIVITY): Troponin I (High Sensitivity): 55 ng/L — ABNORMAL HIGH (ref <18)

## 2019-08-11 LAB — SARS CORONAVIRUS 2 BY RT PCR (HOSPITAL ORDER, PERFORMED IN ~~LOC~~ HOSPITAL LAB): SARS Coronavirus 2: NEGATIVE

## 2019-08-11 MED ORDER — DILTIAZEM HCL 25 MG/5ML IV SOLN
5.0000 mg | Freq: Once | INTRAVENOUS | Status: AC
  Administered 2019-08-11: 5 mg via INTRAVENOUS
  Filled 2019-08-11: qty 5

## 2019-08-11 MED ORDER — HEPARIN (PORCINE) 25000 UT/250ML-% IV SOLN
600.0000 [IU]/h | INTRAVENOUS | Status: DC
  Administered 2019-08-11: 500 [IU]/h via INTRAVENOUS
  Filled 2019-08-11 (×2): qty 250

## 2019-08-11 MED ORDER — SODIUM CHLORIDE 0.9 % IV SOLN: Freq: Once | INTRAVENOUS | Status: AC

## 2019-08-11 MED ORDER — SODIUM CHLORIDE 0.9% FLUSH
3.0000 mL | Freq: Two times a day (BID) | INTRAVENOUS | Status: DC
  Administered 2019-08-11 – 2019-08-12 (×2): 3 mL via INTRAVENOUS

## 2019-08-11 MED ORDER — METOPROLOL TARTRATE 5 MG/5ML IV SOLN: 5.0000 mg | INTRAVENOUS | Status: DC | PRN

## 2019-08-11 MED ORDER — DILTIAZEM HCL-DEXTROSE 125-5 MG/125ML-% IV SOLN (PREMIX)
5.0000 mg/h | INTRAVENOUS | Status: DC
  Filled 2019-08-11: qty 125

## 2019-08-11 MED ORDER — SODIUM CHLORIDE 0.9 % IV BOLUS (SEPSIS)
500.0000 mL | Freq: Once | INTRAVENOUS | Status: AC
  Administered 2019-08-11: 500 mL via INTRAVENOUS

## 2019-08-11 NOTE — Progress Notes (Signed)
ANTICOAGULATION CONSULT NOTE - Initial Consult  Pharmacy Consult for heparin  Indication: chest pain/ACS  Allergies  Allergen Reactions  . Compazine [Prochlorperazine Edisylate]     could not talk or hold head up  . Demerol [Meperidine]     itch, nasal cong  . Dilaudid [Hydromorphone Hcl]     itch  . Novocain [Procaine]     heart races  . Other     "some antibiotic": muscle flexion Tetanus-Diphth-Acell Pertussis: left side numb    Patient Measurements:   Heparin Dosing Weight: 46 kg   Vital Signs: Temp: 98.3 F (36.8 C) (07/06 1658) Temp Source: Rectal (07/06 1658) BP: 116/59 (07/06 1830) Pulse Rate: 55 (07/06 1841)  Labs: Recent Labs    08/09/19 0259 08/10/19 0320 08/11/19 1714  HGB 12.3  --  14.0  HCT 40.8  --  46.9*  PLT 250  --  273  CREATININE 0.80 0.81 0.93  TROPONINIHS  --   --  55*    Estimated Creatinine Clearance: 31.6 mL/min (by C-G formula based on SCr of 0.93 mg/dL).   Medical History: Past Medical History:  Diagnosis Date  . Allergy   . CHF (congestive heart failure) ([place])   . Dementia ([place])   . Fibromyalgia    jt pains, Tramadol Miller 0-4 per day   . Fractured hip ([place]) 10/2010   fractured left hip, L THR   . IBS (irritable bowel syndrome)    IBS-constipation  . On prednisone therapy 2008-2010   Continuous prednisone therapy   . Palpitations    paroxysmal atrial tachycardia, EF normal, diastolic dysfunction-echo/Holter 10/10 (she did not wish to take medications prescribed, metoprolol/HCTZ)  . PNA (pneumonia)    w/lung abscess at 79 months of age    Medications:  (Not in a hospital admission)   Assessment: 81 YOF with elevated troponins concerning for ACS to start IV heparin. Hgb and plt wnl. SCr wnl.   Goal of Therapy:  Heparin level 0.3-0.7 units/ml Monitor platelets by anticoagulation protocol: Yes   Plan:  -Start IV heparin at 550 units/hr. No bolus -F/u 8 hr HL -Monitor daily HL, CBC and s/s of bleeding   Albertina Parr, PharmD., BCPS, BCCCP Clinical Pharmacist Clinical phone for 08/11/19 until 11:30pm: [phone] If after 11:30pm, please refer to [facility] for unit-specific pharmacist

## 2019-08-11 NOTE — Progress Notes (Signed)
[facility]) hospital liaison note  Received a call from ED PA Madilyn Hook asking about pt status with [facility] hospice.  This pt had an appointment at [place] today to meet with the admission nurse in her home.  Hospital liaison team will follow through discharge and will reschedule admission visit as appropriate.  Please do not hesitate to contact the team with any questions.  Thank you for the opportunity to participate in this patient's care.  Domenic Moras, BSN, RN Manufacturing engineer (in [place]) [phone] (24h on call)

## 2019-08-11 NOTE — ED Provider Notes (Signed)
[place] EMERGENCY DEPARTMENT Provider Note   CSN: [id number] Arrival date & time: 08/11/19  1627     History Chief Complaint  Patient presents with  . Altered Mental Status    FALLON HAECKER is a 86 y.o. female.  Patient is an 86 year old female with multiple medical problems including dementia,CHF , A. fib RVR.  She was just discharged from the hospital yesterday after a stay for respiratory failure.   She is not on chronic oxygen.  Not on anticoagulation.  She is post be discharged home with home hospice who were to see her today at 5 PM.  However, the husband reported that he was concerned because all the patient was doing when she got home was sleeping since yesterday so he called EMS.  Per the notes from her discharge summary, patient's goals were to go home with home hospice if she had began eating again versus inpatient hospice if she was not eating on her own.  Prior to discharge she was up and eating on her own.  Has reports now she is only sleeping since yesterday.  Patient is a level 5 caveat due to her dementia.  She is irritable and mildly combative and moaning.  She follows some commands but is combative        Past Medical History:  Diagnosis Date  . Allergy   . CHF (congestive heart failure) ([place])   . Dementia ([place])   . Fibromyalgia    jt pains, Tramadol Miller 0-4 per day   . Fractured hip ([place]) 10/2010   fractured left hip, L THR   . IBS (irritable bowel syndrome)    IBS-constipation  . On prednisone therapy 2008-2010   Continuous prednisone therapy   . Palpitations    paroxysmal atrial tachycardia, EF normal, diastolic dysfunction-echo/Holter 10/10 (she did not wish to take medications prescribed, metoprolol/HCTZ)  . PNA (pneumonia)    w/lung abscess at 33 months of age    Patient Active Problem List   Diagnosis Date Noted  . Palliative care encounter   . Atrial flutter ([place]) 08/01/2019  . Acute hypoxemic respiratory failure ([place])  08/01/2019  . Acute respiratory failure ([place])   . Acute respiratory failure with hypoxia ([place])   . Shock ([place]) 08/06/2014  . Acute diastolic CHF (congestive heart failure) ([place]) 08/06/2014  . Acute respiratory failure with hypoxemia ([place]) 08/05/2014  . Respiratory failure ([place])   . Dyspnea 07/30/2014  . Acute CHF ([place]) 07/30/2014  . Accelerated hypertension 07/30/2014  . Anemia 07/30/2014  . Dementia ([place]) 07/30/2014  . Acute confusional state 07/30/2014    Past Surgical History:  Procedure Laterality Date  . CHOLECYSTECTOMY  1998  . COLONOSCOPY     2005 wnl per old records  . CYST EXCISION     breast cyst removed   . LOBECTOMY  1956   lung, partial   . NASAL SINUS SURGERY  1982     OB History   No obstetric history on file.     Family History  Problem Relation Age of Onset  . Heart attack Mother   . Lupus Paternal Grandmother     Social History   Tobacco Use  . Smoking status: Former Research scientist (life sciences)  . Smokeless tobacco: Never Used  Vaping Use  . Vaping Use: Never used  Substance Use Topics  . Alcohol use: No  . Drug use: No    Home Medications Prior to Admission medications   Medication Sig Start Date End Date Taking?  Authorizing Provider  digoxin (LANOXIN) 0.125 MG tablet Take 1 tablet (0.125 mg total) by mouth daily. 08/11/19  Yes Shawna Clamp, MD  furosemide (LASIX) 20 MG tablet Take 1 tablet (20 mg total) by mouth daily. 08/10/14  Yes Domenic Polite, MD  hydrOXYzine (ATARAX/VISTARIL) 25 MG tablet Take 25 mg by mouth 3 (three) times daily as needed for anxiety. HydrOXYzine HCl 25 Tablet 1/2 to 1 tablet Three times a day Orally 30 days   Yes [provider]  losartan (COZAAR) 25 MG tablet Take 0.5 tablets (12.5 mg total) by mouth daily. 08/11/19  Yes Shawna Clamp, MD  metoprolol tartrate (LOPRESSOR) 50 MG tablet Take 1 tablet (50 mg total) by mouth 2 (two) times daily. 08/10/19  Yes Shawna Clamp, MD  aspirin EC 81 MG EC tablet Take 1 tablet (81 mg total)  by mouth daily. Patient not taking: Reported on 08/01/2019 07/31/14   Florencia Reasons, MD  potassium chloride SA (K-DUR,KLOR-CON) 20 MEQ tablet Take 1 tablet (20 mEq total) by mouth daily. Patient not taking: Reported on 05/02/2016 08/10/14   Domenic Polite, MD    Allergies    Compazine [prochlorperazine edisylate], Demerol [meperidine], Dilaudid [hydromorphone hcl], Novocain [procaine], and Other  Review of Systems   Review of Systems  Unable to perform ROS: Dementia    Physical Exam Updated Vital Signs BP (!) 116/59   Pulse (!) 55   Temp 98.3 F (36.8 C) (Rectal)   Resp 17   SpO2 93%   Physical Exam Constitutional:      General: She is not in acute distress.    Appearance: She is not ill-appearing, toxic-appearing or diaphoretic.     Comments: Frail and elderly  HENT:     Head: Normocephalic and atraumatic.     Nose: Nose normal.     Mouth/Throat:     Pharynx: Oropharynx is clear.  Eyes:     Pupils: Pupils are equal, round, and reactive to light.  Cardiovascular:     Rate and Rhythm: Tachycardia present. Rhythm irregular.  Pulmonary:     Effort: Pulmonary effort is normal.     Breath sounds: Normal breath sounds.  Abdominal:     General: Abdomen is flat. There is no distension.     Palpations: Abdomen is soft.     Tenderness: There is no abdominal tenderness.  Skin:    General: Skin is warm.     Coloration: Skin is not cyanotic, jaundiced, mottled or pale.     Findings: No abscess, erythema or rash.  Neurological:     Mental Status: She is alert.     Comments: Somnolent and sleeping at rest but combative when awakened. Follows occasional commands. Moans but does not speak words.  Psychiatric:        Behavior: Behavior is agitated.     ED Results / Procedures / Treatments   Labs (all labs ordered are listed, but only abnormal results are displayed) Labs Reviewed  COMPREHENSIVE METABOLIC PANEL - Abnormal; Notable for the following components:      Result Value    Potassium 5.2 (*)    Chloride 92 (*)    CO2 33 (*)    BUN 25 (*)    Albumin 3.4 (*)    AST 51 (*)    Total Bilirubin 2.4 (*)    GFR calc non Af Amer 56 (*)    All other components within normal limits  CBC WITH DIFFERENTIAL/PLATELET - Abnormal; Notable for the following components:   RBC 5.51 (*)  HCT 46.9 (*)    MCH 25.4 (*)    MCHC 29.9 (*)    RDW 18.6 (*)    Monocytes Absolute 1.4 (*)    Basophils Absolute 0.2 (*)    All other components within normal limits  TROPONIN I (HIGH SENSITIVITY) - Abnormal; Notable for the following components:   Troponin I (High Sensitivity) 55 (*)    All other components within normal limits  SARS CORONAVIRUS 2 BY RT PCR (HOSPITAL ORDER, [facility])  URINALYSIS, ROUTINE W REFLEX MICROSCOPIC  TROPONIN I (HIGH SENSITIVITY)    EKG EKG Interpretation  Date/Time:  Tuesday August 11 2019 17:29:02 EDT Ventricular Rate:  134 PR Interval:    QRS Duration: 117 QT Interval:  331 QTC Calculation: 495 R Axis:   -83 Text Interpretation: Atrial fibrillation Ventricular premature complex RBBB and LAFB Abnormal T, consider ischemia, lateral leads Since last tracing rate faster Confirmed by Dorie Rank [phone]) on 08/11/2019 6:00:54 PM   Radiology DG Chest Portable 1 View  Result Date: 08/11/2019 CLINICAL DATA:  Altered mental status.  Combative. EXAM: PORTABLE CHEST 1 VIEW COMPARISON:  08/01/2019, also 08/07/2014. FINDINGS: Stable heart size and mediastinal contours with mild cardiomegaly. Aortic atherosclerosis. Stable volume loss in the left hemithorax and basilar opacity from 2016. no acute airspace disease, pulmonary edema, or pneumothorax. The bones are under mineralized. IMPRESSION: 1. No acute chest findings. 2. Volume loss in the left hemithorax and basilar opacity likely related to scarring given stability for 5 years. Electronically Signed   By: Keith Rake M.D.   On: 08/11/2019 17:47    Procedures Procedures (including  critical care time)  Medications Ordered in ED Medications  0.9 %  sodium chloride infusion (has no administration in time range)  sodium chloride 0.9 % bolus 500 mL (0 mLs Intravenous Stopped 08/11/19 1805)  diltiazem (CARDIZEM) injection 5 mg (5 mg Intravenous Given 08/11/19 1812)    ED Course  I have reviewed the triage vital signs and the nursing notes.  Pertinent labs & imaging results that were available during my care of the patient were reviewed by me and considered in my medical decision making (see chart for details).  Clinical Course as of Aug 10 1848  Tue Aug 11, 2019  1808 Patient discharged from the hospital yesterday to home hospice brought in by EMS when her husband called 911 today due to patient sleeping all day.  Husband wanted her to be checked out.  Home hospice was supposed to see her for first evaluation at 5 PM today.  Patient has history of A. fib RVR and is also presenting with this today with a heart rate of 150.  Not on anticoagulation.  She is a level 5 caveat due to her dementia and is awake and somewhat agitated and combative.  She was very dehydrated and there was no urine in her bladder when attempted to cath her.  A small bolus of saline as well as a bolus of diltiazem were ordered.  Labs pending.  She is otherwise afebrile with stable blood pressure and normal oxygen on room air.  Discussed with Dr. Tomi Bamberger and plan agreed upon.  I did speak with Crystal in from hospice who reported that they would be happy to reschedule and see the patient at their house in the morning if she were to be discharged.  I also spoke with the patient's husband who reported that he was concerned because she was sleeping all day so he called  [phone] and just wanted him to be checked out.   [KM]  6468 Patient responding to diltiazem and fluids.  Last blood pressure 126/88.  Last pulse was 104.  She is sleepy and somnolent when left alone but becomes agitated and combative when we try to wake her  up.  She has a mild hyper kalemia to 5.2 and a CO2 of 33 but this is improved from yesterday.  Her AST and bilirubin are mildly elevated but patient does not seem to have significant abdominal pain and there have been no GI symptoms such as vomiting or diarrhea.  We continue to wait for urinalysis   [KM]  1849 Patient will be admitted to the hospitalist for further ongoing needs of her A. fib as well as to discuss inpatient hospice husband is unable to care for at home.   [KM]    Clinical Course User Index [KM] Kristine Royal   MDM Rules/Calculators/A&P                          CRITICAL CARE Performed by: Alveria Apley   Total critical care time: 30 minutes  Critical care time was exclusive of separately billable procedures and treating other patients.  Critical care was necessary to treat or prevent imminent or life-threatening deterioration.  Critical care was time spent personally by me on the following activities: development of treatment plan with patient and/or surrogate as well as nursing, discussions with consultants, evaluation of patient's response to treatment, examination of patient, obtaining history from patient or surrogate, ordering and performing treatments and interventions, ordering and review of laboratory studies, ordering and review of radiographic studies, pulse oximetry and re-evaluation of patient's condition.  Final Clinical Impression(s) / ED Diagnoses Final diagnoses:  Atrial fibrillation with RVR ([place])  Dementia associated with other underlying disease without behavioral disturbance [facility])    Rx / DC Orders ED Discharge Orders    None       Kristine Royal 08/11/19 1850    Dorie Rank, MD 08/11/19 2345

## 2019-08-11 NOTE — H&P (Signed)
History and Physical    PLEASE NOTE THAT DRAGON DICTATION SOFTWARE WAS USED IN THE CONSTRUCTION OF THIS NOTE.   VIRGENE TIRONE QBH:[id number] DOB: Sep 22, 1930 DOA: 08/11/2019  PCP: Lawerance Cruel, MD Patient coming from: home   I have personally briefly reviewed patient's old medical records in [place]  Chief Complaint: increased somnolence  HPI: SHAWNETTA LEIN is a 86 y.o. female with medical history significant for chronic combined heart failure, paroxysmal atrial fibrillation, severe aortic stenosis with most recent aortic valve area by VTI noted to be 0.66, who is admitted to [facility] on 08/11/2019 with A fib RVR after presenting from home to [facility] Emergency Department for evaluation of increased somnolence.   In the setting of the patient's dementia, the following history was provided by the patient's husband, my discussions with the ED physician, and via chart review.   The patient was recently hospitalized in the [place] system from 08/01/19 to 08/10/19 for acute hypoxic respiratory failure in the setting of acute on chronic combined heart failure as well as atrial fibrillation with RVR. Echo on 08/02/19 showed moderate LVH, LV EF 40-45%, global hypokinesis, indeterminant diastolic fxn, moderate TR, and severe AS with AVA per continuity equation using VTI noted to be 0.66. Per chart review it appears that the patient was deemed to not be candidate for TAVR or surgery relating to her severe AS, rather medical management per cardiology consultation. Given no mechanical intervention for severe AS and anticipation of progressive worsening thereof, palliative care consult/family meeting took place during this previous hospitalization. Subsequently the patient was discharged with home hospice, with plan to transition to inpatient hospice should the patient subsequently be unable to tolerate PO intake. Over the last day following discharge, patient's husband reports  that the patient appeared slightly more somnolent relative to baseline, and that she has been unable to take any of PO medications in this capacity, which include Lopressor, Digoxin, Lasix, and Losartan. Husband contacted patient's hospice service, [facility], who arranged for a representative to visit the patient/her husband later today for evaluation of possibility of transitioning to inpatient hospice. However, feeling like he did not want to wait for his appointment with the hospice service, the patient's husband subsequently called EMS, and patient was brought to [facility] ED for further evaluation of diminished appetite and decreased ability to take PO.  Per discussions with the ED PA and the patient's husband, the husband confirmed that patient is DNR/DNI. He confirmed that the patient should receive conservative medical management, and that the patient would want any surgical intervention as well as no coronary angiography. He is considering transition to pure comfort care measures, but requests conservative medical management in the meantime overnight.     ED Course:  Vital signs in the ED were notable for the following: Tmax 98.7; Initial HR 134, which decreased to 108 following single dose of IV Dilt; BP 100/60 - 122/93, with the later BP occurring following administration of Diltiazem IV x 1 dose. RR 17-19; O2 94% on RA.   Labs were notable for the following: BMP notable for the following Na 140, potassium 52; BUN 25, Cr 0.93, relative to creatine of 0.81 from 08/10/19. CBC notable for wbc of 9000. UA ordered with result currently pending.   CXR showed no evidence of acute cardiopulmonary process. EKG showed A fib RVR with VR 134, T wave inversion in V1-V5, unchanged from EKG on 08/04/19, and ST depression in V5-V6, which appeared  new relative to this most recent prior EKG; no evidence of ST elevation.   While in the ED, the following were administered: Diltiazem 5 mg IV x 1; NS bolus x 500  cc; subsequently transitioned to NS @ 75/hr.    Review of Systems: As per HPI otherwise 10 point review of systems negative.   Past Medical History:  Diagnosis Date  . Allergy   . CHF (congestive heart failure) ([place])   . Dementia ([place])   . Fibromyalgia    jt pains, Tramadol [place] 0-4 per day   . Fractured hip ([place]) 10/2010   fractured left hip, L THR   . IBS (irritable bowel syndrome)    IBS-constipation  . On prednisone therapy 2008-2010   Continuous prednisone therapy   . Palpitations    paroxysmal atrial tachycardia, EF normal, diastolic dysfunction-echo/Holter 10/10 (she did not wish to take medications prescribed, metoprolol/HCTZ)  . PNA (pneumonia)    w/lung abscess at 82 months of age    Past Surgical History:  Procedure Laterality Date  . CHOLECYSTECTOMY  1998  . COLONOSCOPY     2005 wnl per old records  . CYST EXCISION     breast cyst removed   . LOBECTOMY  1956   lung, partial   . NASAL SINUS SURGERY  1982    Social History:  reports that she has quit smoking. She has never used smokeless tobacco. She reports that she does not drink alcohol and does not use drugs.   Allergies  Allergen Reactions  . Compazine [Prochlorperazine Edisylate]     could not talk or hold head up  . Demerol [Meperidine]     itch, nasal cong  . Dilaudid [Hydromorphone Hcl]     itch  . Novocain [Procaine]     heart races  . Other     "some antibiotic": muscle flexion Tetanus-Diphth-Acell Pertussis: left side numb    Family History  Problem Relation Age of Onset  . Heart attack Mother   . Lupus Paternal Grandmother     Prior to Admission medications   Medication Sig Start Date End Date Taking? Authorizing Provider  digoxin (LANOXIN) 0.125 MG tablet Take 1 tablet (0.125 mg total) by mouth daily. 08/11/19  Yes Shawna Clamp, MD  furosemide (LASIX) 20 MG tablet Take 1 tablet (20 mg total) by mouth daily. 08/10/14  Yes Domenic Polite, MD  hydrOXYzine (ATARAX/VISTARIL) 25 MG  tablet Take 25 mg by mouth 3 (three) times daily as needed for anxiety. HydrOXYzine HCl 25 Tablet 1/2 to 1 tablet Three times a day Orally 30 days   Yes [provider]  losartan (COZAAR) 25 MG tablet Take 0.5 tablets (12.5 mg total) by mouth daily. 08/11/19  Yes Shawna Clamp, MD  metoprolol tartrate (LOPRESSOR) 50 MG tablet Take 1 tablet (50 mg total) by mouth 2 (two) times daily. 08/10/19  Yes Shawna Clamp, MD  aspirin EC 81 MG EC tablet Take 1 tablet (81 mg total) by mouth daily. Patient not taking: Reported on 08/01/2019 07/31/14   Florencia Reasons, MD  potassium chloride SA (K-DUR,KLOR-CON) 20 MEQ tablet Take 1 tablet (20 mEq total) by mouth daily. Patient not taking: Reported on 05/02/2016 08/10/14   Domenic Polite, MD     Objective    Physical Exam: Vitals:   08/11/19 1830 08/11/19 1834 08/11/19 1837 08/11/19 1841  BP: (!) 116/59     Pulse: 65 78 (!) 108 (!) 55  Resp: _0 Temp:  TempSrc:      SpO2: 100% 99% 98% 93%    General: appears to be stated age; somnolent, but awakens to verbal stimuli; unable to follow instructions. Skin: warm, dry, no rash Head:  AT/[place] Eyes:  PEARL b/l, EOMI Mouth:  Oral mucosa membranes appear dry, normal dentition Neck: supple; trachea midline Heart:  Irregular; tachycardic;  Lungs: CTAB, did not appreciate any wheezes, rales, or rhonchi Abdomen: + BS; soft, ND, NT Vascular: 2+ pedal pulses b/l; 2+ radial pulses b/l Extremities: no peripheral edema, no muscle wasting Neuro: in setting of patient's dementia and inability to following instructions, unable to fully assess strength, sensation, or CN's at this time.     Labs on Admission: I have personally reviewed following labs and imaging studies  CBC: Recent Labs  Lab 08/05/19 0223 08/08/19 0246 08/09/19 0259 08/11/19 1714  WBC 7.9 6.4 9.1 9.0  NEUTROABS  --  4.6  --  5.9  HGB 12.2 12.8 12.3 14.0  HCT 39.9 45.1 40.8 46.9*  MCV 84.9 90.9 88.1 85.1  PLT 240 238 250 094    Basic Metabolic Panel: Recent Labs  Lab 08/05/19 0223 08/06/19 0206 08/07/19 1326 08/08/19 0246 08/09/19 0259 08/10/19 0320 08/11/19 1714  NA 139   < > 141 138 139 137 140  K 3.4*   < > 4.6 4.7 3.7 3.5 5.2*  CL 93*   < > 96* 94* 93* 90* 92*  CO2 29   < > 32 31 34* 35* 33*  GLUCOSE 120*   < > 100* 154* 110* 97 89  BUN 11   < > 27* 32* 26* 22 25*  CREATININE 0.78   < > 0.96 1.07* 0.80 0.81 0.93  CALCIUM 8.6*   < > 9.2 8.9 8.9 9.0 9.2  MG 1.9  --   --   --  1.9 1.7  --   PHOS  --   --   --   --  1.1* 2.6  --    < > = values in this interval not displayed.   GFR: Estimated Creatinine Clearance: 31.6 mL/min (by C-G formula based on SCr of 0.93 mg/dL). Liver Function Tests: Recent Labs  Lab 08/09/19 0259 08/11/19 1714  AST 32 51*  ALT 13 20  ALKPHOS 55 66  BILITOT 0.8 2.4*  PROT 6.9 7.0  ALBUMIN 3.4* 3.4*   No results for input(s): LIPASE, AMYLASE in the last 168 hours. No results for input(s): AMMONIA in the last 168 hours. Coagulation Profile: No results for input(s): INR, PROTIME in the last 168 hours. Cardiac Enzymes: No results for input(s): CKTOTAL, CKMB, CKMBINDEX, TROPONINI in the last 168 hours. BNP (last 3 results) No results for input(s): PROBNP in the last 8760 hours. HbA1C: No results for input(s): HGBA1C in the last 72 hours. CBG: Recent Labs  Lab 08/08/19 1916 08/08/19 2323 08/09/19 0335 08/09/19 1959 08/09/19 2313  GLUCAP 119* 158* 99 98 120*   Lipid Profile: No results for input(s): CHOL, HDL, LDLCALC, TRIG, CHOLHDL, LDLDIRECT in the last 72 hours. Thyroid Function Tests: No results for input(s): TSH, T4TOTAL, FREET4, T3FREE, THYROIDAB in the last 72 hours. Anemia Panel: No results for input(s): VITAMINB12, FOLATE, FERRITIN, TIBC, IRON, RETICCTPCT in the last 72 hours. Urine analysis:    Component Value Date/Time   COLORURINE YELLOW 08/01/2019 [place] 08/01/2019 1052   LABSPEC 1.015 08/01/2019 1052   PHURINE 5.0  08/01/2019 [place] 08/01/2019 [place] 08/01/2019 1052  BILIRUBINUR NEGATIVE 08/01/2019 [place] 08/01/2019 [place] 08/01/2019 1052   UROBILINOGEN 0.2 08/05/2014 0415   NITRITE NEGATIVE 08/01/2019 1052   LEUKOCYTESUR NEGATIVE 08/01/2019 1052    Radiological Exams on Admission: DG Chest Portable 1 View  Result Date: 08/11/2019 CLINICAL DATA:  Altered mental status.  Combative. EXAM: PORTABLE CHEST 1 VIEW COMPARISON:  08/01/2019, also 08/07/2014. FINDINGS: Stable heart size and mediastinal contours with mild cardiomegaly. Aortic atherosclerosis. Stable volume loss in the left hemithorax and basilar opacity from 2016. no acute airspace disease, pulmonary edema, or pneumothorax. The bones are under mineralized. IMPRESSION: 1. No acute chest findings. 2. Volume loss in the left hemithorax and basilar opacity likely related to scarring given stability for 5 years. Electronically Signed   By: Keith Rake M.D.   On: 08/11/2019 17:47     EKG: Independently reviewed, with result as described above.    Assessment/Plan   ARRIONNA SERENA is a 86 y.o. female with medical history significant for chronic combined heart failure, paroxysmal atrial fibrillation, severe aortic stenosis with most recent aortic valve area by VTI noted to be 0.66, who is admitted to [facility] on 08/11/2019 with A fib RVR after presenting from home to [facility] Emergency Department for evaluation of increased somnolence.    Principal Problem:   Atrial fibrillation with RVR (HCC) Active Problems:   Hyperkalemia   Severe aortic stenosis   Chronic combined systolic and diastolic CHF (congestive heart failure) (HCC)   #) Atrial Fibrillation with RVR: in setting of h/o paroxsymal atrial fibrillation, patient found to be in RVR at time of today's presentation after discharge to home hospice the day prior on digoxin, lopressor. While criteria met  for chronic anticoagualtion in setting CHA2DS2-VASc score of 5 would documentation from previous hospitalization reveals that risks versus benefits discussion yielded decision to pursue an antiplatelet approach alone.  Consequently the patient was discharged on a daily baby aspirin alone.  No evidence of acutely decompensated heart failure at presentation, although suboptimal compliance with outpatient AV nodal blocking agents suspected to have contributed to her presenting A. fib with RVR.  Heart rate into the low 100s following a single dose of diltiazem 5 mg IV x1, with associated improvement in blood pressure. Of note, initial EKG while VR in the 130's showed new ST depression in in v5-v6, but no evidence of acute ischemic changes, including no evidence of ST elevation. As described above, the patient husband, who is her POA, requests only conservative medical management, and is considering transitioning to comfort care measures alone in the morning.  Will pursue overnight rate control measures, with anticipation of further goals of care discussion with the patient's husband in the morning.   Plan: Diltiazem drip, titrated to goal HR of less than 115, with close monitoring of ensuing blood pressure response to this measure.  Monitor on telemetry.  Add on serum magnesium level.  Repeat BMP in the morning.  With improved rate control, will now repeat EKG to evaluate previously described new onset ST depression in V5/V6, which is currently suspected to be rate related.  Holding home digoxin as well as Lopressor for now.     #) Hyperkalemia: Mildly elevated presenting potassium of 5.2, felt to be on the basis of dehydration as well as as a consequence of potential side effect from recent initiation of losartan.  The patient has received 500 cc normal saline, with, followed by transition to maintenance IV fluids.  Plan: Will repeat BMP now, with reevaluation of intervention should ensuing potassium level  not demonstrate interval decline following administration of aforementioned IV fluids.  Hold home losartan.  Monitor on telemetry.  Add on serum magnesium level.  Repeat BMP in the morning.      #) Elevated Troponin: Mildly elevated presenting high-sensitivity troponin I in the absence of any prior troponin value for point comparison; suspect Type 2 supply demand mismatch in the setting of presenting A. fib with RVR contributing to diminished diastolic filling time with resultant ramifications on coronary artery perfusion.  Suspect that this type II process is more likely relative to type I process due to plaque rupture a/w ACS.  In the context of her underlying dementia, the patient is unable to contain if she is experiencing any chest discomfort.  EKG shows new onset of ST depression in V5/V6 which may well be on the basis of rate related sequela.  No evidence of associated ST elevation.  Will focus on improved rate control over night.  We will repeat serum troponin level, although further elevation of this value would not appear to change management, as the patient's husband is conveyed that the patient should receive conservative, medical management alone and would not wish to pursue any surgical intervention or coronary angiography, as further described above.  Patient unable to take aspirin p.o. at this time. Will initiate Heparin drip, but consideration can be given to its discontinuation should subsequent troponin values trended.   Plan: Work-up and management of A. fib with RVR, as above.  Heparin drip as above.  Monitor on telemetry.  Repeat troponin.  With improved rate control, will repeat EKG to evaluate for resolution of previously described ST depression in V5/V6.      #) Chronic combined heart failure: Most recent echocardiogram performed on 08/02/2019, with results as described above.  At this time, no evidence of acutely decompensated heart failure in spite of patient unable to take her  oral medical management regimen at home following history is discharged.  Rather, she clinically appears to be slightly intravascularly depleted, although the delicate fluid balance in the setting of severe aortic stenosis significance afterload and preload dependent processes is acknowledged.   Plan: Lasix for now.  Monitor strict I's and O's and daily weights.  Repeat BMP in the morning.     #) Severe aortic stenosis: With most recent echo showing severe AS with aortic valve area by continuity equation using VTI noted to be 0.66, with previous documentation suggesting that the patient is not a candidate for TAVR or surgical intervention.  In the absence of mechanical intervention, anticipate ultimate progression of patient's aortic stenosis, with ensuing medical management of such complicated by the preload and afterload dependent nature of this mechanical pathology.  Patient's has been considering transition to comfort care in the morning.  Plan: I have placed a palliative care consult in the morning to reassess goals of care with the patient's husband, including the possibility of transitioning to comfort care measures alone.  Overnight, will render conservative medical management, as further detailed above.    DVT prophylaxis: Heparin drip, as above  Code Status: DNR/DNI (as confirmed by patient's husband, who is her POA). Family Communication: case discussed with patient's husband, as above. Disposition Plan: Per Rounding Team Consults called: none  Admission status: inpatient; PCU    PLEASE NOTE THAT DRAGON DICTATION SOFTWARE WAS USED IN THE CONSTRUCTION OF THIS NOTE.   Rhetta Mura DO [facility] Pager [phone]  From 6PM- 2AM   08/11/2019, 6:47 PM

## 2019-08-11 NOTE — ED Notes (Signed)
Attempted to collect troponin. Unable to collect. Phlebotomy notified.

## 2019-08-11 NOTE — Telephone Encounter (Signed)
Palliative Medicine RN Note: Our team rec'd calls on both the team cell phone and team office phone from Mr Sean. He is concerned about Mrs Rutkowski status after her discharge.  When I returned his call to the cell phone, he politely told me that he had just spoken to someone who was going to look into her records and tell him what to do to help her, so he didn't need my help. He did not explain what concerns he had, other than wondering if what she was doing was normal for someone who just got out of the hospital.  After I got off that call, our Administrative Assistant, Junie Panning, called me with the message from him on our office line. He told her that she's been asleep since she got home. Patient was set up with [facility] prior to discharge.  I called Bevely Palmer, [facility] Liaison for today. They are sending out a nurse to do a visit today.  Marjie Skiff Branden Shallenberger, RN, BSN, [facility] Palliative Medicine Team 08/11/2019 1:29 PM Office [phone]

## 2019-08-11 NOTE — ED Notes (Signed)
In and out preformed. No urine obtained.

## 2019-08-11 NOTE — ED Triage Notes (Signed)
Per EMS Husband is concerned because she was released from hospital yesterday for hypoxemia and has not returned to her baseline. She has not responded verbally and has been staying in bed.

## 2019-08-12 DIAGNOSIS — Z66 Do not resuscitate: Secondary | ICD-10-CM

## 2019-08-12 DIAGNOSIS — Z515 Encounter for palliative care: Secondary | ICD-10-CM

## 2019-08-12 DIAGNOSIS — I4891 Unspecified atrial fibrillation: Secondary | ICD-10-CM | POA: Diagnosis present

## 2019-08-12 DIAGNOSIS — E875 Hyperkalemia: Secondary | ICD-10-CM | POA: Diagnosis present

## 2019-08-12 DIAGNOSIS — Z7189 Other specified counseling: Secondary | ICD-10-CM

## 2019-08-12 DIAGNOSIS — I350 Nonrheumatic aortic (valve) stenosis: Secondary | ICD-10-CM

## 2019-08-12 DIAGNOSIS — I5042 Chronic combined systolic (congestive) and diastolic (congestive) heart failure: Secondary | ICD-10-CM | POA: Diagnosis present

## 2019-08-12 DIAGNOSIS — F0280 Dementia in other diseases classified elsewhere without behavioral disturbance: Secondary | ICD-10-CM

## 2019-08-12 LAB — COMPREHENSIVE METABOLIC PANEL
ALT: 15 U/L (ref 0–44)
AST: 32 U/L (ref 15–41)
Albumin: 2.9 g/dL — ABNORMAL LOW (ref 3.5–5.0)
Alkaline Phosphatase: 57 U/L (ref 38–126)
Anion gap: 14 (ref 5–15)
BUN: 21 mg/dL (ref 8–23)
CO2: 27 mmol/L (ref 22–32)
Calcium: 8.6 mg/dL — ABNORMAL LOW (ref 8.9–10.3)
Chloride: 101 mmol/L (ref 98–111)
Creatinine, Ser: 0.73 mg/dL (ref 0.44–1.00)
GFR calc Af Amer: >60 mL/min (ref >60)
GFR calc non Af Amer: >60 mL/min (ref >60)
Glucose, Bld: 96 mg/dL (ref 70–99)
Potassium: 3.7 mmol/L (ref 3.5–5.1)
Sodium: 142 mmol/L (ref 135–145)
Total Bilirubin: 1.5 mg/dL — ABNORMAL HIGH (ref 0.3–1.2)
Total Protein: 6.1 g/dL — ABNORMAL LOW (ref 6.5–8.1)

## 2019-08-12 LAB — CBC
HCT: 41.8 % (ref 36.0–46.0)
Hemoglobin: 12.8 g/dL (ref 12.0–15.0)
MCH: 26.8 pg (ref 26.0–34.0)
MCHC: 30.6 g/dL (ref 30.0–36.0)
MCV: 87.6 fL (ref 80.0–100.0)
Platelets: 289 10*3/uL (ref 150–400)
RBC: 4.77 MIL/uL (ref 3.87–5.11)
RDW: 18.4 % — ABNORMAL HIGH (ref 11.5–15.5)
WBC: 7.5 10*3/uL (ref 4.0–10.5)
nRBC: 0 % (ref 0.0–0.2)

## 2019-08-12 LAB — CBG MONITORING, ED: Glucose-Capillary: 64 mg/dL — ABNORMAL LOW (ref 70–99)

## 2019-08-12 LAB — HEPARIN LEVEL (UNFRACTIONATED): Heparin Unfractionated: 0.23 IU/mL — ABNORMAL LOW (ref 0.30–0.70)

## 2019-08-12 LAB — TROPONIN I (HIGH SENSITIVITY): Troponin I (High Sensitivity): 58 ng/L — ABNORMAL HIGH (ref <18)

## 2019-08-12 LAB — MAGNESIUM: Magnesium: 1.7 mg/dL (ref 1.7–2.4)

## 2019-08-12 MED ORDER — BIOTENE DRY MOUTH MT LIQD: 15.0000 mL | OROMUCOSAL | Status: DC | PRN

## 2019-08-12 MED ORDER — DEXTROSE-NACL 5-0.45 % IV SOLN: INTRAVENOUS | Status: DC

## 2019-08-12 MED ORDER — LORAZEPAM 2 MG/ML PO CONC: 0.5000 mg | Freq: Four times a day (QID) | ORAL | Status: DC | PRN

## 2019-08-12 MED ORDER — METOPROLOL TARTRATE 5 MG/5ML IV SOLN: 5.0000 mg | INTRAVENOUS | Status: DC | PRN

## 2019-08-12 MED ORDER — ONDANSETRON HCL 4 MG/2ML IJ SOLN: 4.0000 mg | Freq: Four times a day (QID) | INTRAMUSCULAR | Status: DC | PRN

## 2019-08-12 MED ORDER — HALOPERIDOL LACTATE 5 MG/ML IJ SOLN
0.5000 mg | Freq: Four times a day (QID) | INTRAMUSCULAR | Status: DC | PRN
  Administered 2019-08-12: 0.5 mg via INTRAVENOUS
  Filled 2019-08-12: qty 1

## 2019-08-12 MED ORDER — LORAZEPAM 2 MG/ML IJ SOLN
0.5000 mg | Freq: Four times a day (QID) | INTRAMUSCULAR | Status: DC | PRN
  Administered 2019-08-13 (×2): 0.5 mg via INTRAVENOUS
  Filled 2019-08-12 (×3): qty 1

## 2019-08-12 NOTE — Progress Notes (Signed)
[place] for heparin  Indication: chest pain/ACS  Allergies  Allergen Reactions  . Compazine [Prochlorperazine Edisylate]     could not talk or hold head up  . Demerol [Meperidine]     itch, nasal cong  . Dilaudid [Hydromorphone Hcl]     itch  . Novocain [Procaine]     heart races  . Other     "some antibiotic": muscle flexion Tetanus-Diphth-Acell Pertussis: left side numb    Patient Measurements:   Heparin Dosing Weight: 46 kg   Vital Signs: BP: 134/85 (07/07 0300) Pulse Rate: 119 (07/07 0038)  Labs: Recent Labs    08/10/19 0320 08/11/19 1714 08/12/19 0609  HGB  --  14.0 12.8  HCT  --  46.9* 41.8  PLT  --  273 289  HEPARINUNFRC  --   --  0.23*  CREATININE 0.81 0.93  --   TROPONINIHS  --  55*  --     Estimated Creatinine Clearance: 31.6 mL/min (by C-G formula based on SCr of 0.93 mg/dL).  Assessment: 86 y.o. female with Afib and elevated cardiac markers for heparin  Goal of Therapy:  Heparin level 0.3-0.7 units/ml Monitor platelets by anticoagulation protocol: Yes   Plan:  Increase Heparin 600 units/hr Check heparin level in 8 hours.   Phillis Knack, PharmD, BCPS

## 2019-08-12 NOTE — ED Notes (Signed)
Pt was noted to be pushing herself up on the railing, attempting to get out of bed. Attempted to redirect the pt, but was unsuccessful as the pt was getting more and more agitated. Primary RN to bedside with pt as well, attempting to comfort pt and encourage continued rest.

## 2019-08-12 NOTE — ED Notes (Signed)
PER MD heparin will be stopped

## 2019-08-12 NOTE — Progress Notes (Signed)
PROGRESS NOTE  [name] HYW:[id number] DOB: 04/03/1930 DOA: 08/11/2019 PCP: Theresa Cruel, MD  HPI/Recap of past 24 hours: HPI from Dr [name] is a 86 y.o. female with medical history significant for dementia, chronic combined heart failure, paroxysmal atrial fibrillation, severe aortic stenosis with most recent aortic valve area by VTI noted to be 0.66, who is admitted with A fib RVR and increased somnolence. History provided by the patient's husband due to dementia. Patient was recently hospitalized in the [facility] system from 08/01/19 to 08/10/19 for acute hypoxic respiratory failure in the setting of acute on chronic combined heart failure as well as atrial fibrillation with RVR. Per chart review it appears that the patient was deemed to not be candidate for TAVR or surgery relating to her severe AS, rather medical management per cardiology consultation. Given no mechanical intervention for severe AS and anticipation of progressive worsening thereof, palliative care consult/family meeting took place during this previous hospitalization. Subsequently the patient was discharged with home hospice, with plan to transition to inpatient hospice should the patient subsequently be unable to tolerate PO intake. Over the last day following discharge, patient's husband reports that the patient appeared slightly more somnolent relative to baseline, and that she has been unable to take any of PO medications in this capacity. Per discussions with the ED PA and the patient's husband, the husband confirmed that patient is DNR/DNI. He is considering transition to pure comfort care measures. Palliative consulted    Today, pt restless in bed, very HOH.  Oriented to self only, currently denies any discomfort, although hx of dementia. Family meeting planned for today @ 1pm with palliative team for possible residential hospice placement.      Assessment/Plan: Principal Problem:   Atrial  fibrillation with RVR (HCC) Active Problems:   Hyperkalemia   Severe aortic stenosis   Chronic combined systolic and diastolic CHF (congestive heart failure) (HCC)   A flutter with RVR Rate uncontrolled Troponin slightly elevated with a flat trend 55-58, likely 2/2 atrial flutter EKG showing atrial flutter S/p diltiazem drip, switch to IV metoprolol as needed Continue heparin drip for now Continue above treatments for now pending discussions with palliative team Telemetry  Chronic systolic HF/aortic valve stenosis Appears euvolemic Echo done 08/02/2019 showed EF of 40 to 45%, global hypokinesis, left ventricular diastolic parameters indeterminate No further intervention by cardiology, not a candidate for TAVR Strict I's and O's, daily weights  Dysphagia Poor oral intake, n.p.o. for now Continue very gentle hydration for now  Dementia Patient currently slightly agitated Delirium precautions  Goals of care discussion Due to advanced age, multiple comorbidities, poor oral intake, will be a good candidate for residential hospice.  Palliative meeting scheduled at 1 PM today as mentioned above        Malnutrition Type:      Malnutrition Characteristics:      Nutrition Interventions:       Estimated body mass index is 18.59 kg/m as calculated from the following:   Height as of 08/05/19: 5\' 2"  (1.575 m).   Weight as of 08/06/19: 46.1 kg.     Code Status: DNR  Family Communication: None at bedside  Disposition Plan: Status is: Inpatient  Remains inpatient appropriate because:Inpatient level of care appropriate due to severity of illness   Dispo: The patient is from: Home              Anticipated d/c is to: Possible residential hospice  Anticipated d/c date is: 2 days              Patient currently is not medically stable to d/c.    Consultants:  Palliative care  Procedures:  None  Antimicrobials:  None  DVT prophylaxis: Heparin  drip   Objective: Vitals:   08/12/19 0934 08/12/19 1000 08/12/19 1030 08/12/19 1031  BP:  (!) 156/90  (!) 156/90  Pulse:    (!) 115  Resp: 19 (!) 23 20 20   Temp:    98.3 F (36.8 C)  TempSrc:    Oral  SpO2:    98%    Intake/Output Summary (Last 24 hours) at 08/12/2019 1229 Last data filed at 08/11/2019 1805 Gross per 24 hour  Intake 0 ml  Output --  Net 0 ml   There were no vitals filed for this visit.  Exam:  General: NAD, agitated, very hard of hearing, oriented to self  Cardiovascular: S1, S2 present  Respiratory: CTAB  Abdomen: Soft, nontender, nondistended, bowel sounds present  Musculoskeletal: No bilateral pedal edema noted  Skin: Normal  Psychiatry:  Unable to assess    Data Reviewed: CBC: Recent Labs  Lab 08/08/19 0246 08/09/19 0259 08/11/19 1714 08/12/19 0609  WBC 6.4 9.1 9.0 7.5  NEUTROABS 4.6  --  5.9  --   HGB 12.8 12.3 14.0 12.8  HCT 45.1 40.8 46.9* 41.8  MCV 90.9 88.1 85.1 87.6  PLT 238 250 273 992   Basic Metabolic Panel: Recent Labs  Lab 08/08/19 0246 08/09/19 0259 08/10/19 0320 08/11/19 1714 08/12/19 0609  NA 138 139 137 140 142  K 4.7 3.7 3.5 5.2* 3.7  CL 94* 93* 90* 92* 101  CO2 31 34* 35* 33* 27  GLUCOSE 154* 110* 97 89 96  BUN 32* 26* 22 25* 21  CREATININE 1.07* 0.80 0.81 0.93 0.73  CALCIUM 8.9 8.9 9.0 9.2 8.6*  MG  --  1.9 1.7  --  1.7  PHOS  --  1.1* 2.6  --   --    GFR: Estimated Creatinine Clearance: 36.7 mL/min (by C-G formula based on SCr of 0.73 mg/dL). Liver Function Tests: Recent Labs  Lab 08/09/19 0259 08/11/19 1714 08/12/19 0609  AST 32 51* 32  ALT 13 20 15   ALKPHOS 55 66 57  BILITOT 0.8 2.4* 1.5*  PROT 6.9 7.0 6.1*  ALBUMIN 3.4* 3.4* 2.9*   No results for input(s): LIPASE, AMYLASE in the last 168 hours. No results for input(s): AMMONIA in the last 168 hours. Coagulation Profile: No results for input(s): INR, PROTIME in the last 168 hours. Cardiac Enzymes: No results for input(s): CKTOTAL,  CKMB, CKMBINDEX, TROPONINI in the last 168 hours. BNP (last 3 results) No results for input(s): PROBNP in the last 8760 hours. HbA1C: No results for input(s): HGBA1C in the last 72 hours. CBG: Recent Labs  Lab 08/08/19 2323 08/09/19 0335 08/09/19 1959 08/09/19 2313 08/12/19 0008  GLUCAP 158* 99 98 120* 64*   Lipid Profile: No results for input(s): CHOL, HDL, LDLCALC, TRIG, CHOLHDL, LDLDIRECT in the last 72 hours. Thyroid Function Tests: No results for input(s): TSH, T4TOTAL, FREET4, T3FREE, THYROIDAB in the last 72 hours. Anemia Panel: No results for input(s): VITAMINB12, FOLATE, FERRITIN, TIBC, IRON, RETICCTPCT in the last 72 hours. Urine analysis:    Component Value Date/Time   COLORURINE YELLOW 08/11/2019 2010   APPEARANCEUR CLEAR 08/11/2019 2010   LABSPEC 1.019 08/11/2019 2010   PHURINE 6.0 08/11/2019 2010   [place] NEGATIVE 08/11/2019 2010  HGBUR NEGATIVE 08/11/2019 2010   [place] NEGATIVE 08/11/2019 2010   KETONESUR 20 (A) 08/11/2019 2010   PROTEINUR NEGATIVE 08/11/2019 2010   UROBILINOGEN 0.2 08/05/2014 0415   NITRITE NEGATIVE 08/11/2019 2010   LEUKOCYTESUR NEGATIVE 08/11/2019 2010   Sepsis Labs: @LABRCNTIP (procalcitonin:4,lacticidven:4)  ) Recent Results (from the past 240 hour(s))  MRSA PCR Screening     Status: None   Collection Time: 08/04/19  1:35 AM   Specimen: Nasopharyngeal Wash  Result Value Ref Range Status   MRSA by PCR NEGATIVE NEGATIVE Final    Comment:        The GeneXpert MRSA Assay (FDA approved for NASAL specimens only), is one component of a comprehensive MRSA colonization surveillance program. It is not intended to diagnose MRSA infection nor to guide or monitor treatment for MRSA infections. Performed at [facility], [place] [address], [place], [place] [address]   SARS Coronavirus 2 by RT PCR (hospital order, performed in [facility] hospital lab) Nasopharyngeal Nasopharyngeal Swab     Status: None    Collection Time: 08/11/19  7:42 PM   Specimen: Nasopharyngeal Swab  Result Value Ref Range Status   SARS Coronavirus 2 NEGATIVE NEGATIVE Final    Comment: (NOTE) SARS-CoV-2 target nucleic acids are NOT DETECTED.  The SARS-CoV-2 RNA is generally detectable in upper and lower respiratory specimens during the acute phase of infection. The lowest concentration of SARS-CoV-2 viral copies this assay can detect is 250 copies / mL. A negative result does not preclude SARS-CoV-2 infection and should not be used as the sole basis for treatment or other patient management decisions.  A negative result may occur with improper specimen collection / handling, submission of specimen other than nasopharyngeal swab, presence of viral mutation(s) within the areas targeted by this assay, and inadequate number of viral copies (<250 copies / mL). A negative result must be combined with clinical observations, patient history, and epidemiological information.  Fact Sheet for Patients:   StrictlyIdeas.no  Fact Sheet for Healthcare Providers: BankingDealers.co.za  This test is not yet approved or  cleared by the [place] FDA and has been authorized for detection and/or diagnosis of SARS-CoV-2 by FDA under an Emergency Use Authorization (EUA).  This EUA will remain in effect (meaning this test can be used) for the duration of the COVID-19 declaration under Section 564(b)(1) of the Act, 21 U.S.C. section 360bbb-3(b)(1), unless the authorization is terminated or revoked sooner.  Performed at [facility], [place] [address], [place],  [address]       Studies: DG Chest Portable 1 View  Result Date: 08/11/2019 CLINICAL DATA:  Altered mental status.  Combative. EXAM: PORTABLE CHEST 1 VIEW COMPARISON:  08/01/2019, also 08/07/2014. FINDINGS: Stable heart size and mediastinal contours with mild cardiomegaly. Aortic atherosclerosis. Stable volume loss  in the left hemithorax and basilar opacity from 2016. no acute airspace disease, pulmonary edema, or pneumothorax. The bones are under mineralized. IMPRESSION: 1. No acute chest findings. 2. Volume loss in the left hemithorax and basilar opacity likely related to scarring given stability for 5 years. Electronically Signed   By: Keith Rake M.D.   On: 08/11/2019 17:47    Scheduled Meds: . sodium chloride flush  3 mL Intravenous Q12H    Continuous Infusions: . dextrose 5 % and 0.45% NaCl 50 mL/hr at 08/12/19 0038  . heparin 600 Units/hr (08/12/19 0724)     LOS: 1 day     Alma Friendly, MD [facility]  If 7PM-7AM,  please contact night-coverage [email or web address] 08/12/2019, 12:29 PM

## 2019-08-12 NOTE — Consult Note (Addendum)
Consultation Note Date: 08/12/2019   Patient Name: [name]  DOB: Nov 20, 1930  MRN: [id number]  Age / Sex: 86 y.o., female   PCP: Lawerance Cruel, MD Referring Physician: Alma Friendly, MD   REASON FOR CONSULTATION:Establishing goals of care  Palliative Care consult requested for goals of care discussion in this 86 y.o. female with multiple medical problems including chronic combined heart failure, paroxysmal atrial fibrillation (not on anticoagulant due to history of falls), severe aortic stenosis, hip fracture, and dementia. She presented to the ED from home with concerns of increased somnolence per husband. Patient was recently hospitalized and discharged home with hospice on 08/10/19 after treatment for hypoxic respiratory failure, CHF exacerbation, and a-fib with RVR. She was discharge with outpatient hospice support. However, hospice service had not been initiated and husband became concerned regarding patient's condition warranting return visit.   Clinical Assessment and Goals of Care: I have reviewed medical records including lab results, imaging, Epic notes, and MAR, received report from the bedside RN, and assessed the patient. I met at the bedside with patient's husband Shiquita Collignon to discuss diagnosis prognosis, [place], EOL wishes, disposition and options.  Patient and spouse familiar to the Palliative team with interactions recently during her previous admission last week. I re-introduced Palliative Medicine as specialized medical care for people living with serious illness. It focuses on providing relief from the symptoms and stress of a serious illness. The goal is to improve quality of life for both the patient and the family. Mr. Asby verbalized understanding and appreciation of our continued support.   Mr. Pitz reports they have been married for 78 years and have 1 son who lives close by. He shares son has his own health conditions and not available to offer  much support. He does have some friends and community members that offers support at times. Mrs. Bauman enjoys watching tv and going to the beauty salon.   Prior to admission patient had only been home for a day and Mr. Kowalczyk was concerned given her spending most of her time sleeping. He reports he assist with ADLs. Her appetite is up and down. He reports some days or moments are better than others which is also the case with getting her to take medications.   We discussed Her current illness and what it means in the larger context of Her on-going co-morbidities. With specific discussions regarding progression of dementia, her heart failure, and overall decreased functional and nutritional state. Natural disease trajectory and expectations at EOL were discussed.  Joneen Boers reports understanding. He reports his focus is for patient to be kept comfortable during what time she has left and not undergo aggressive interventions. He is tearful expressing her continued decline including challenges in getting her to eat and take medications. He shares she sometimes becomes agitated and will not allow him to provide care at times. Support given.    I discussed at length with Mr. Bonebrake expectations at EOL including patient's somnolence, decreased appetite, and agitation. I discussed symptom management to ensure patient remains comfortable in the home with a goal of eliminating recurrent hospitalizations.   I attempted to elicit values and goals of care important to the patient. Mr. Stelmach was again clear in expressing goals for patient is to be focused on comfort with no aggressive interventions or life prolonging measures. He is not interested in artificial feedings or returning to the hospital.   Detailed education regarding symptom management while hospitalized and in the  home to focus on patient's comfort as requested. We discussed the use of medications for anxiety/agitation, pain, and shortness of breath  amongst other symptoms. He understands that patient could potentially become more somnolent with use of medication however in the setting of comfort this would be most appropriate. Mr. Obyrne verbalized understanding and request for medications to be available as needed. We discussed no further lab work, radiology testing or medical interventions not focused on comfort. Mr. Yarborough verbalized understanding and agreement.    Joneen Boers confirms wishes for DNR/DNI and goal of comfort.   We reviewed Hospice services outpatient were explained and offered. I discussed outpatient hospice in the home as well as [facility]. Mr. Biermann verbalized understanding and hospice's goals and philosophy of care. He reports he would prefer to take her home and allow her an opportunity to be in her own environment however, if care becomes unmanageable he would then like to transfer her to [facility] for continued care. He understands she is a good candidate for [facility] if he changes his mind prior to discharge.   Questions and concerns were addressed.  Mr. Azbill was encouraged to call with questions or concerns.  PMT will continue to support holistically.   SOCIAL HISTORY:     reports that she has quit smoking. She has never used smokeless tobacco. She reports that she does not drink alcohol and does not use drugs.  CODE STATUS: DNR  ADVANCE DIRECTIVES: Sultana Tierney (husband)   SYMPTOM MANAGEMENT: see below   Palliative Prophylaxis:   Aspiration, Delirium Protocol, Frequent Pain Assessment, Oral Care and Turn Reposition  PSYCHO-SOCIAL/SPIRITUAL:  Support System: Family  Desire for further Chaplaincy support:NO   Additional Recommendations (Limitations, Scope, Preferences):  Minimize Medications, No Diagnostics, No Lab Draws and no escalation of care with a goal of comfort and home with hospice support    PAST MEDICAL HISTORY: Past Medical History:  Diagnosis Date  . Allergy   . CHF (congestive  heart failure) ([place])   . Dementia ([place])   . Fibromyalgia    jt pains, Tramadol Miller 0-4 per day   . Fractured hip ([place]) 10/2010   fractured left hip, L THR   . IBS (irritable bowel syndrome)    IBS-constipation  . On prednisone therapy 2008-2010   Continuous prednisone therapy   . Palpitations    paroxysmal atrial tachycardia, EF normal, diastolic dysfunction-echo/Holter 10/10 (she did not wish to take medications prescribed, metoprolol/HCTZ)  . 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 CHOLECYSTECTOMY  1998  . COLONOSCOPY     2005 wnl per old records  . CYST EXCISION     breast cyst removed   . LOBECTOMY  1956   lung, partial   . NASAL SINUS SURGERY  1982    ALLERGIES:  is allergic to compazine [prochlorperazine edisylate], demerol [meperidine], dilaudid [hydromorphone hcl], novocain [procaine], and other.   MEDICATIONS:  Current Facility-Administered Medications  Medication Dose Route Frequency Provider Last Rate Last Admin  . dextrose 5 %-0.45 % sodium chloride infusion   Intravenous Continuous Howerter, Justin B, DO 50 mL/hr at 08/12/19 0038 New Bag at 08/12/19 0038  . heparin ADULT infusion 100 units/mL (25000 units/275m sodium chloride 0.45%)  600 Units/hr Intravenous Continuous EAlma Friendly MD 6 mL/hr at 08/12/19 0724 600 Units/hr at 08/12/19 0724  . metoprolol tartrate (LOPRESSOR) injection 5 mg  5  mg Intravenous Q5 min PRN Howerter, Justin B, DO      . sodium chloride flush (NS) 0.9 % injection 3 mL  3 mL Intravenous Q12H Howerter, Justin B, DO   3 mL at 08/11/19 2206   Current Outpatient Medications  Medication Sig Dispense Refill  . digoxin (LANOXIN) 0.125 MG tablet Take 1 tablet (0.125 mg total) by mouth daily. 30 tablet 2  . furosemide (LASIX) 20 MG tablet Take 1 tablet (20 mg total) by mouth daily. 30 tablet 0  . hydrOXYzine (ATARAX/VISTARIL) 25 MG tablet  Take 25 mg by mouth 3 (three) times daily as needed for anxiety. HydrOXYzine HCl 25 Tablet 1/2 to 1 tablet Three times a day Orally 30 days    . losartan (COZAAR) 25 MG tablet Take 0.5 tablets (12.5 mg total) by mouth daily. 30 tablet 2  . metoprolol tartrate (LOPRESSOR) 50 MG tablet Take 1 tablet (50 mg total) by mouth 2 (two) times daily. 30 tablet 1  . aspirin EC 81 MG EC tablet Take 1 tablet (81 mg total) by mouth daily. (Patient not taking: Reported on 08/01/2019) 30 tablet 0  . potassium chloride SA (K-DUR,KLOR-CON) 20 MEQ tablet Take 1 tablet (20 mEq total) by mouth daily. (Patient not taking: Reported on 05/02/2016) 30 tablet 0    VITAL SIGNS: BP (!) 156/90 (BP Location: Right Arm)   Pulse (!) 115   Temp 98.3 F (36.8 C) (Oral)   Resp 20   SpO2 98%  There were no vitals filed for this visit.  Estimated body mass index is 18.59 kg/m as calculated from the following:   Height as of 08/05/19: '5\' 2"'  (1.575 m).   Weight as of 08/06/19: 46.1 kg.  LABS: CBC:    Component Value Date/Time   WBC 7.5 08/12/2019 0609   HGB 12.8 08/12/2019 0609   HCT 41.8 08/12/2019 0609   PLT 289 08/12/2019 0609   Comprehensive Metabolic Panel:    Component Value Date/Time   NA 142 08/12/2019 0609   K 3.7 08/12/2019 0609   CO2 27 08/12/2019 0609   BUN 21 08/12/2019 0609   CREATININE 0.73 08/12/2019 0609   ALBUMIN 2.9 (L) 08/12/2019 0609     Review of Systems  Unable to perform ROS: Dementia   Physical Exam General: NAD, chronically-ill appearing, thin Cardiovascular: Irregular  Pulmonary: diminished in bases  Abdomen: soft, nontender, + bowel sounds Extremities: no edema, no joint deformities Skin: no rashes, thin, warm, dry, scattered bruising Neurological: alert to self and husband only, very hard of hearing, will follow simple commands, continues to yell out or ask same questions repeatedly.    Prognosis: Poor: weeks in the setting of poor PO intake, deconditioning, worsening dementia,  CHF, a-fib with RVR (no anticoagulation).   Discharge Planning:  Home with Hospice  Recommendations:  DNR/DNI-as confirmed by husband  Comfort care focus with goal of discharging home with hospice support.   I have reached out to Anderson Malta, Therapist, sports [facility] Liaison). They are planning to assess patient in the home once discharged to prevent further readmission/hospitalization.   Goals clear and set. Husband prefers patient to be in the home for comfort with hospice however, if becomes unmanageable he would like to [facility] to [facility].   Discussed symptom management with a focus on comfort. Husband is aware of EOL expectations and the likelihood of increased somnolence.   Ativan SL PRN for agitation/anxiety in the setting of difficulty administering medications. (Please provide RX at discharge)  Roxanol PRN for  pain/dyspnea  If patient would take medication could consider low-dose Seroquel or Zyprexa to assist with delirium/mood.   PMT will continue to support and follow   Palliative Performance Scale: PPS 20%                 Husband expressed understanding and was in agreement with this plan.   Thank you for allowing the Palliative Medicine Team to assist in the care of this patient.  Time In: 3685 Time Out: 1400 Time Total: 75 min.   Visit consisted of counseling and education dealing with the complex and emotionally intense issues of symptom management and palliative care in the setting of serious and potentially life-threatening illness.Greater than 50%  of this time was spent counseling and coordinating care related to the above assessment and plan.  Signed by:  Alda Lea, AGPCNP-BC Palliative Medicine Team  Phone: [phone] Fax: [phone] Pager: [phone] Amion: Bjorn Pippin

## 2019-08-12 NOTE — ED Notes (Signed)
The pts sats drop when she curls up in a ball but her sats are 96-97 on room air

## 2019-08-12 NOTE — Progress Notes (Addendum)
Manufacturing engineer [facility])  Noted pt spouse with questions and plans to meet with PMT later today.  Please let [facility] know how we can assist.  Thank you, Venia Carbon RN, BSN, [facility] Liaison   **Hospice will plan to meet family in the home on Thursday to begin hospice services.  [facility] will coordinate with family and hospital surrounding a d/c time so the family has the most support in the home tomorrow.

## 2019-08-12 NOTE — ED Notes (Signed)
Admitting MD paged concerning CBG 64; pt becomes increasingly agitated when attempting to obtain cbg.

## 2019-08-12 NOTE — ED Notes (Signed)
Pharmacy was informed of unable to get blood work . Pharmacy will speak with the doctor regarding if we need it.

## 2019-08-12 NOTE — Progress Notes (Signed)
Palliative Note:  Consult received for goals of care discussion/assessing for transition to comfort in the setting of severe AD (patient is not a candidate for TAVR or surgical intervention).   Chart reviewed and updates received from RN. Patient assessed at the bedside. She is alert to self and husband via phone only. Able to follow some commands. Extremely hard of hearing (L>R).   I was able to speak with patient's husband, Joneen Boers via phone. Patient able to speak with husband also via phone. I introduced myself and re-introduced Palliative's role in patient's care while hospitalized. Husband verbalized understanding and appreciation.   Patient and family is familiar to our team from previous admission several days ago. Patient initially discharged home with outpatient hospice support.   Mr. Joneen Boers verbalize need for home hospice however states he would like to discuss further and had questions regarding expectations.   He states he plans to visit patient later today and is requesting to meet at the bedside around 1pm. Support given and confirmation of planned meeting today (08/12/19 @ 1300). Mr. Bergeron is aware I will meet him at patient's room.   RN updated.   Detailed notes and recommendations following Casey meeting.   Thank you for allowing Palliative to assist in Mrs. Edwards care.   Alda Lea, AGPCNP-BC Palliative Medicine Team  Phone: [phone] Pager: [phone] Amion: N. Cousar   NO CHARGE

## 2019-08-13 MED ORDER — LORAZEPAM 2 MG/ML PO CONC: 0.6000 mg | Freq: Four times a day (QID) | ORAL | 0 refills | Status: AC | PRN

## 2019-08-13 MED ORDER — QUETIAPINE FUMARATE 25 MG PO TABS
25.0000 mg | ORAL_TABLET | Freq: Every day | ORAL | 0 refills | Status: AC
Start: 2019-08-13 — End: 2019-09-12

## 2019-08-13 NOTE — ED Notes (Signed)
Pt sleeping since the ativan

## 2019-08-13 NOTE — Consult Note (Signed)
   [facility] CM Inpatient Consult   08/13/2019  [name] [birth year] [id number]   [place] [employer] [ACO] Patient:  Medicare ACO  Patient screened for  unplanned readmission less than 7 days hospitalization.  Medical record reviewed to check if potential [place] [facility] service needs.  Review of patient's medical record reveals patient is currently being recommended for hospice care per review of Palliative consult.  Plan:  Patient will have full care management under hospice.    Sign off, no [facility] care management needed at this review.    For questions contact:   Natividad Brood, RN BSN [facility] Liaison  [phone] business mobile phone Toll free office [phone]  Fax number: [phone] [email or web address]

## 2019-08-13 NOTE — TOC Initial Note (Signed)
Transition of Care [facility]) - Initial/Assessment Note    Patient Details  Name: [name] MRN: [id number] Date of Birth: [birth year]  [facility]) CM/SW Contact:    Marilu Favre, RN Phone Number: 08/13/2019, 10:34 AM  Clinical Narrative:                 Patient confused , spoke to husband Joneen Boers via phone.   Patient recently discharged home with [facility] for hospice. They were going to admit her ( initial visit) 7/5 at 1700, husband brought her to ED 7/5 at 1530.  See Palliative Note.   Spoke to Trixie Dredge this am with [facility]. They do have a bed available today at [facility]. Family can visit her there. Only restriction is two visitors at a time. Husband can spend the nights with her if he wants.   Called Joneen Boers discussed above. He voiced understanding, however he still wants Lindsea to go home with Hospice through [facility]. He is aware discharge may be today , and would like her transported by ambulance.   PAtient has a walker at home. Joneen Boers does not think she needs any other DME at this time. Hospice will assess.      Expected Discharge Plan: [place]     Patient Goals and CMS Choice Patient states their goals for this hospitalization and ongoing recovery are:: spoke to husband he wants her to go home with hospice not [facility]. CMS Medicare.gov Compare Post Acute Care list provided to:: Other (Comment Required) (husband Joneen Boers) Choice offered to / list presented to : Spouse  Expected Discharge Plan and Services Expected Discharge Plan: Home w Hospice Care   Discharge Planning Services: CM Consult Post Acute Care Choice: Hospice Living arrangements for the past 2 months: Single Family Home                 DME Arranged: N/A DME Agency: NA       HH Arranged: NA          Prior Living Arrangements/Services Living arrangements for the past 2 months: Single Family Home Lives with:: Spouse Patient language and  need for interpreter reviewed:: Yes            Current home services: DME Criminal Activity/Legal Involvement Pertinent to Current Situation/Hospitalization: No - Comment as needed  Activities of Daily Living      Permission Sought/Granted      Share Information with NAME: Joneen Boers husband           Emotional Assessment              Admission diagnosis:  Atrial fibrillation with rapid ventricular response ([place]) [I48.91] Atrial fibrillation with RVR ([place]) [I48.91] Dementia associated with other underlying disease without behavioral disturbance ([place]) [F02.80] Patient Active Problem List   Diagnosis Date Noted  . Hyperkalemia 08/12/2019  . Severe aortic stenosis 08/12/2019  . Chronic combined systolic and diastolic CHF (congestive heart failure) ([place]) 08/12/2019  . Atrial fibrillation with rapid ventricular response ([place]) 08/12/2019  . Atrial fibrillation with RVR ([place]) 08/11/2019  . Palliative care encounter   . Atrial flutter ([place]) 08/01/2019  . Acute hypoxemic respiratory failure ([place]) 08/01/2019  . Acute respiratory failure ([place])   . Acute respiratory failure with hypoxia ([place])   . Shock ([place]) 08/06/2014  . Acute diastolic CHF (congestive heart failure) ([place]) 08/06/2014  . Acute respiratory failure with hypoxemia ([place]) 08/05/2014  . Respiratory failure ([place])   .  Dyspnea 07/30/2014  . Acute CHF ([place]) 07/30/2014  . Accelerated hypertension 07/30/2014  . Anemia 07/30/2014  . Dementia ([place]) 07/30/2014  . Acute confusional state 07/30/2014   PCP:  Lawerance Cruel, MD Pharmacy:   [place], [place] - [address] [place] [place] [address] Phone: [phone] Fax: [phone]     Social Determinants of Health (SDOH) Interventions    Readmission Risk Interventions No flowsheet data found.

## 2019-08-13 NOTE — Progress Notes (Signed)
   Daily Progress Note   Patient Name: [name]       Date: 08/13/2019 DOB: [birth year]  Age: 86 y.o. MRN#: [id number] Attending Physician: Alma Friendly, MD Primary Care Physician: Lawerance Cruel, MD Admit Date: 08/11/2019  Reason for Consultation/Follow-up: Establishing goals of care  Subjective: Patient in bed drinking coffee. Requesting for oatmeal (which RN confirms is being delivered by dietary). Denies pain or shortness of breath. Continues to ask where her husband is. She is alert to self only. Much more calmer today. Follows commands appropriately.   No family is at the bedside. Spoke to husband who is aware patient will most likely discharge home today with hospice. I again re-educated Mr. Seeman on the role of hospice, goal of no re-hospitalization as he requested, and comfort care in the home. He verbalized understanding expressing once hospice comes out to the home and he knows they are on-board he will feel much better.   He is aware that patient has the option for [facility]. Reviewed the care and the ability for him to remain at the bedside as he chooses with patient and provide more emotional support with the benefit of having 24/7 staff to assist with symptom management. Mr. nataliee shurtz understanding, however he again confirms wishes for patient to return home and receive care with hospice. He states if things did not work out he would be more open to [employer] [facility] [employer]. Education provided on bed availability which changes often. He verbalized understanding .  All questions answered and support given.    Length of Stay: 2 days  Vital Signs: BP 133/70 (BP Location: Left Arm)   Pulse 90   Temp 97.6 F (36.4 C) (Oral)   Resp 16   SpO2 (!) 78%  SpO2: SpO2: (!) 78 % O2 Device: O2 Device: Room Air O2 Flow Rate:    Intake/output summary:   Intake/Output Summary (Last 24 hours) at 08/13/2019 1033 Last data filed at 08/13/2019 0500 Gross per 24 hour  Intake 527  ml  Output --  Net 527 ml   LBM:   Baseline Weight:   Most recent weight:                Palliative Care Assessment & Plan    Code Status:  DNR   Goals of Care:  Home with outpatient hospice support.   Will need RX for ativan for symptom management (agitation)  Prognosis: Poor   Discharge Planning: Home with Hospice  Thank you for allowing the Palliative Medicine Team to assist in the care of this patient.  Time Total: 25 min.   Visit consisted of counseling and education dealing with the complex and emotionally intense issues of symptom management and palliative care in the setting of serious and potentially life-threatening illness.Greater than 50%  of this time was spent counseling and coordinating care related to the above assessment and plan.  Alda Lea, AGPCNP-BC  Palliative Medicine Team [phone]

## 2019-08-13 NOTE — Progress Notes (Signed)
Pt arrived to 6N04 via bed. See assessment. Pt awake and confused, disoriented x4 at baseline. Pt is attempting to get out of bed and pulling at IV line. See MAR for prn med given. Will continue to monitor pt.

## 2019-08-13 NOTE — ED Notes (Signed)
Report given to rn on 6n      

## 2019-08-13 NOTE — Discharge Summary (Signed)
Discharge Summary  [name] GYI:[id number] DOB: 03-Jul-1930  PCP: Theresa Cruel, MD  Admit date: 08/11/2019 Discharge date: 08/13/2019  Time spent: 40 mins  Recommendations for Outpatient Follow-up:  1. Home with hospice  Discharge Diagnoses:  Active Hospital Problems   Diagnosis Date Noted  . Atrial fibrillation with RVR ([place]) 08/11/2019  . Hyperkalemia 08/12/2019  . Severe aortic stenosis 08/12/2019  . Chronic combined systolic and diastolic CHF (congestive heart failure) ([place]) 08/12/2019  . Atrial fibrillation with rapid ventricular response ([place]) 08/12/2019    Resolved Hospital Problems  No resolved problems to display.    Discharge Condition: Fair  Diet recommendation: Comfort feeds as tolerated  Vitals:   08/13/19 0228 08/13/19 0527  BP: (!) 147/113 133/70  Pulse: (!) 144 90  Resp: 16 16  Temp: (!) 97.3 F (36.3 C) 97.6 F (36.4 C)  SpO2: (!) 84% (!) 78%    History of present illness:  [name] a 86 y.o.femalewith medical history significant fordementia, chronic combined heart failure, paroxysmal atrial fibrillation, severe aortic stenosis with most recent aortic valve area by VTI noted to be 0.66,who is admitted with A fib RVR and increased somnolence. History provided by the patient's husband due to dementia. Patient was recently hospitalized in the [facility] system from 08/01/19 to 08/10/19 for acute hypoxic respiratory failure in the setting of acute on chronic combined heart failure as well as atrial fibrillation with RVR. Per chart review it appears that the patient was deemed to not be candidate for TAVR or surgery relating to her severe AS, rather medical management per cardiology consultation. Given no mechanical intervention for severe AS and anticipation of progressive worsening thereof, palliative care consult/family meeting took place during this previous hospitalization. Subsequently the patient was discharged with home hospice, with  plan to transition to inpatient hospice should the patient subsequently be unable to tolerate PO intake. Over the last day following discharge, patient's husband reports that the patient appeared slightly more somnolent relative to baseline, and that she has been unable to take any of PO medications in this capacity. Per discussions with the ED PA and the patient's husband, the husband confirmed that patient is DNR/DNI. He is considering transition to pure comfort care measures. Palliative consulted.    Today, met patient resting comfortably in bed, easily arousable.  Patient's husband insisting on taking patient home with hospice services, and has a very clear understanding that if patient continues to deteriorate, patient can be transferred to a residential hospice, and not back to the hospital.  Husband was also given the option of a bed at [facility] but declined and insisted on taking patient home.    Hospital Course:  Principal Problem:   Atrial fibrillation with RVR (HCC) Active Problems:   Hyperkalemia   Severe aortic stenosis   Chronic combined systolic and diastolic CHF (congestive heart failure) (HCC)   Atrial fibrillation with rapid ventricular response (H[facility])   A flutter with RVR Rate uncontrolled Troponin slightly elevated with a flat trend 55-58, likely 2/2 atrial flutter EKG showing atrial flutter Patient transitioned to home hospice  Chronic systolic HF/aortic valve stenosis Appears euvolemic Echo done 08/02/2019 showed EF of 40 to 45%, global hypokinesis, left ventricular diastolic parameters indeterminate No further intervention by cardiology, not a candidate for TAVR Transitioned to home hospice  Dysphagia Poor oral intake May continue comfort feeds, dysphagia 3 diet  Dementia Delirium precautions  Goals of care discussion Due to advanced age, multiple comorbidities, poor oral intake,  will be a good candidate for residential hospice.    Husband insisting  on taking patient home with hospice.  Discharge patient on Ativan sublingual for agitation/anxiety, discharged on Seroquel at bedtime if able to take p.o. for mood stabilization.         Malnutrition Type:      Malnutrition Characteristics:      Nutrition Interventions:      Estimated body mass index is 18.59 kg/m as calculated from the following:   Height as of 08/05/19: '5\' 2"'  (1.575 m).   Weight as of 08/06/19: 46.1 kg.    Procedures:  None  Consultations:  Palliative team  Discharge Exam: BP 133/70 (BP Location: Left Arm)   Pulse 90   Temp 97.6 F (36.4 C) (Oral)   Resp 16   SpO2 (!) 78%   General: Currently NAD, resting comfortably Cardiovascular: S1-S2 present Respiratory: CTA B     Discharge Instructions You were cared for by a hospitalist during your hospital stay. If you have any questions about your discharge medications or the care you received while you were in the hospital after you are discharged, you can call the unit and asked to speak with the hospitalist on call if the hospitalist that took care of you is not available. Once you are discharged, your primary care physician will handle any further medical issues. Please note that NO REFILLS for any discharge medications will be authorized once you are discharged, as it is imperative that you return to your primary care physician (or establish a relationship with a primary care physician if you do not have one) for your aftercare needs so that they can reassess your need for medications and monitor your lab values.  Discharge Instructions    Diet - low sodium heart healthy   Complete by: As directed    Increase activity slowly   Complete by: As directed      Allergies as of 08/13/2019      Reactions   Compazine [prochlorperazine Edisylate]    could not talk or hold head up   Demerol [meperidine]    itch, nasal cong   Dilaudid [hydromorphone Hcl]    itch   Novocain [procaine]    heart races    Other    "some antibiotic": muscle flexion Tetanus-Diphth-Acell Pertussis: left side numb      Medication List    STOP taking these medications   aspirin 81 MG EC tablet   digoxin 0.125 MG tablet Commonly known as: LANOXIN   furosemide 20 MG tablet Commonly known as: LASIX   hydrOXYzine 25 MG tablet Commonly known as: ATARAX/VISTARIL   losartan 25 MG tablet Commonly known as: COZAAR   metoprolol tartrate 50 MG tablet Commonly known as: LOPRESSOR   potassium chloride SA 20 MEQ tablet Commonly known as: KLOR-CON     TAKE these medications   LORazepam 2 MG/ML concentrated solution Commonly known as: ATIVAN Place 0.3 mLs (0.6 mg total) under the tongue every 6 (six) hours as needed for anxiety.   QUEtiapine 25 MG tablet Commonly known as: SEROquel Take 1 tablet (25 mg total) by mouth at bedtime.      Allergies  Allergen Reactions  . Compazine [Prochlorperazine Edisylate]     could not talk or hold head up  . Demerol [Meperidine]     itch, nasal cong  . Dilaudid [Hydromorphone Hcl]     itch  . Novocain [Procaine]     heart races  . Other     "  some antibiotic": muscle flexion Tetanus-Diphth-Acell Pertussis: left side numb      The results of significant diagnostics from this hospitalization (including imaging, microbiology, ancillary and laboratory) are listed below for reference.    Significant Diagnostic Studies: DG Chest Portable 1 View  Result Date: 08/11/2019 CLINICAL DATA:  Altered mental status.  Combative. EXAM: PORTABLE CHEST 1 VIEW COMPARISON:  08/01/2019, also 08/07/2014. FINDINGS: Stable heart size and mediastinal contours with mild cardiomegaly. Aortic atherosclerosis. Stable volume loss in the left hemithorax and basilar opacity from 2016. no acute airspace disease, pulmonary edema, or pneumothorax. The bones are under mineralized. IMPRESSION: 1. No acute chest findings. 2. Volume loss in the left hemithorax and basilar opacity likely related to  scarring given stability for 5 years. Electronically Signed   By: Keith Rake M.D.   On: 08/11/2019 17:47   DG Chest Port 1 View  Result Date: 08/01/2019 CLINICAL DATA:  Hypoxia. EXAM: PORTABLE CHEST 1 VIEW COMPARISON:  08/07/2014 FINDINGS: Examination demonstrates mild volume loss of the left lung with stable mild elevation of the left hemidiaphragm. Minimal left base opacification which is chronic/stable. Lungs are otherwise clear. Mild stable cardiomegaly. Remainder of the exam is unchanged. IMPRESSION: 1. No acute findings. Stable left basilar changes and mild stable volume loss of the left lung. 2.  Mild cardiomegaly. Electronically Signed   By: Marin Olp M.D.   On: 08/01/2019 11:33   ECHOCARDIOGRAM COMPLETE  Result Date: 08/02/2019    ECHOCARDIOGRAM REPORT   Patient Name:   [name] Panther Date of Exam: 08/02/2019 Medical Rec #:  [id number]       Height:       62.0 in Accession #:    [id number]      Weight:       114.4 lb Date of Birth:  1930-08-24        BSA:          1.508 m Patient Age:    26 years        BP:           108/61 mmHg Patient Gender: F               HR:           94 bpm. Exam Location:  Inpatient Procedure: 2D Echo, Cardiac Doppler and Color Doppler Indications:    I42.9 Cardiomyopathy (unspecified)  History:        Patient has prior history of Echocardiogram examinations, most                 recent 07/30/2014. CHF.  Sonographer:    Jonelle Sidle Dance Referring Phys: [id number] ANKIT CHIRAG AMIN IMPRESSIONS  1. Left ventricular ejection fraction, by estimation, is 40 to 45%. The left ventricle has mildly decreased function. The left ventricle demonstrates global hypokinesis. There is moderate left ventricular hypertrophy. Left ventricular diastolic parameters are indeterminate.  2. Right ventricular systolic function is mildly reduced. The right ventricular size is normal. There is normal pulmonary artery systolic pressure. The estimated right ventricular systolic pressure is 51.0 mmHg.   3. Left atrial size was mildly dilated.  4. The mitral valve is abnormal, mildly thickened and calcified with somewhat restricted posterior leaflet. Mild mitral valve regurgitation.  5. Tricuspid valve regurgitation is moderate.  6. The aortic valve is tricuspid, severely calcified with decreased cusp excursion. Aortic valve regurgitation is trivial. Probable severe, low gradient aortic valve stenosis (although pseudostenosis with reduced LVEF also possible). Aortic valve area, by VTI measures 0.66 cm.  Aortic valve mean gradient measures 6.5 mmHg. Aortic valve Vmax measures 1.66 m/s. Dimentionless index 0.29.  7. The inferior vena cava is normal in size with <50% respiratory variability, suggesting right atrial pressure of 8 mmHg. FINDINGS  Left Ventricle: Left ventricular ejection fraction, by estimation, is 40 to 45%. The left ventricle has mildly decreased function. The left ventricle demonstrates global hypokinesis. The left ventricular internal cavity size was normal in size. There is  moderate left ventricular hypertrophy. Left ventricular diastolic parameters are indeterminate. Right Ventricle: The right ventricular size is normal. No increase in right ventricular wall thickness. Right ventricular systolic function is mildly reduced. There is normal pulmonary artery systolic pressure. The tricuspid regurgitant velocity is 2.57 m/s, and with an assumed right atrial pressure of 8 mmHg, the estimated right ventricular systolic pressure is 16.1 mmHg. Left Atrium: Left atrial size was mildly dilated. Right Atrium: Right atrial size was normal in size. Pericardium: There is no evidence of pericardial effusion. Mitral Valve: The mitral valve is abnormal. There is mild thickening of the mitral valve leaflet(s). There is mild calcification of the mitral valve leaflet(s). Mild mitral valve regurgitation. Tricuspid Valve: The tricuspid valve is grossly normal. Tricuspid valve regurgitation is moderate. Aortic Valve:  The aortic valve is tricuspid. Aortic valve regurgitation is trivial. Severe aortic stenosis is present. Moderate aortic valve annular calcification. There is severe calcifcation of the aortic valve. Aortic valve mean gradient measures 6.5 mmHg. Aortic valve peak gradient measures 11.1 mmHg. Aortic valve area, by VTI measures 0.66 cm. Pulmonic Valve: The pulmonic valve was grossly normal. Pulmonic valve regurgitation is trivial. Aorta: The aortic root is normal in size and structure. Venous: The inferior vena cava is normal in size with less than 50% respiratory variability, suggesting right atrial pressure of 8 mmHg. IAS/Shunts: No atrial level shunt detected by color flow Doppler.  LEFT VENTRICLE PLAX 2D LVIDd:         3.50 cm LVIDs:         2.50 cm LV PW:         1.50 cm LV IVS:        1.30 cm LVOT diam:     1.70 cm LV SV:         20 LV SV Index:   13 LVOT Area:     2.27 cm  RIGHT VENTRICLE          IVC RV Basal diam:  3.10 cm  IVC diam: 2.10 cm RV Mid diam:    1.70 cm TAPSE (M-mode): 0.6 cm LEFT ATRIUM             Index       RIGHT ATRIUM           Index LA diam:        3.50 cm 2.32 cm/m  RA Area:     20.70 cm LA Vol (A2C):   68.6 ml 45.50 ml/m RA Volume:   59.00 ml  39.13 ml/m LA Vol (A4C):   22.0 ml 14.59 ml/m LA Biplane Vol: 40.1 ml 26.60 ml/m  AORTIC VALVE AV Area (Vmax):    0.69 cm AV Area (Vmean):   0.62 cm AV Area (VTI):     0.66 cm AV Vmax:           166.50 cm/s AV Vmean:          117.500 cm/s AV VTI:            0.302 m AV Peak Grad:  11.1 mmHg AV Mean Grad:      6.5 mmHg LVOT Vmax:         50.50 cm/s LVOT Vmean:        32.300 cm/s LVOT VTI:          0.088 m LVOT/AV VTI ratio: 0.29  AORTA Ao Root diam: 3.00 cm Ao Asc diam:  2.90 cm MITRAL VALVE               TRICUSPID VALVE MV Area (PHT): 3.32 cm    TR Peak grad:   26.4 mmHg MV Decel Time: 229 msec    TR Vmax:        257.00 cm/s MV E velocity: 84.25 cm/s                            SHUNTS                            Systemic VTI:  0.09 m                             Systemic Diam: 1.70 cm Rozann Lesches MD Electronically signed by Rozann Lesches MD Signature Date/Time: 08/02/2019/12:45:50 PM    Final     Microbiology: Recent Results (from the past 240 hour(s))  MRSA PCR Screening     Status: None   Collection Time: 08/04/19  1:35 AM   Specimen: Nasopharyngeal Wash  Result Value Ref Range Status   MRSA by PCR NEGATIVE NEGATIVE Final    Comment:        The GeneXpert MRSA Assay (FDA approved for NASAL specimens only), is one component of a comprehensive MRSA colonization surveillance program. It is not intended to diagnose MRSA infection nor to guide or monitor treatment for MRSA infections. Performed at [facility], [place] [address] [place], [place] [address]   SARS Coronavirus 2 by RT PCR (hospital order, performed in [facility] hospital lab) Nasopharyngeal Nasopharyngeal Swab     Status: None   Collection Time: 08/11/19  7:42 PM   Specimen: Nasopharyngeal Swab  Result Value Ref Range Status   SARS Coronavirus 2 NEGATIVE NEGATIVE Final    Comment: (NOTE) SARS-CoV-2 target nucleic acids are NOT DETECTED.  The SARS-CoV-2 RNA is generally detectable in upper and lower respiratory specimens during the acute phase of infection. The lowest concentration of SARS-CoV-2 viral copies this assay can detect is 250 copies / mL. A negative result does not preclude SARS-CoV-2 infection and should not be used as the sole basis for treatment or other patient management decisions.  A negative result may occur with improper specimen collection / handling, submission of specimen other than nasopharyngeal swab, presence of viral mutation(s) within the areas targeted by this assay, and inadequate number of viral copies (<250 copies / mL). A negative result must be combined with clinical observations, patient history, and epidemiological information.  Fact Sheet for Patients:     StrictlyIdeas.no  Fact Sheet for Healthcare Providers: BankingDealers.co.za  This test is not yet approved or  cleared by the [place] FDA and has been authorized for detection and/or diagnosis of SARS-CoV-2 by FDA under an Emergency Use Authorization (EUA).  This EUA will remain in effect (meaning this test can be used) for the duration of the COVID-19 declaration under Section 564(b)(1) of the Act, 21 U.S.C. section 360bbb-3(b)(1), unless the  authorization is terminated or revoked sooner.  Performed at [facility], [place] [address], [place],  [address]      Labs: Basic Metabolic Panel: Recent Labs  Lab 08/08/19 0246 08/09/19 0259 08/10/19 0320 08/11/19 1714 08/12/19 0609  NA 138 139 137 140 142  K 4.7 3.7 3.5 5.2* 3.7  CL 94* 93* 90* 92* 101  CO2 31 34* 35* 33* 27  GLUCOSE 154* 110* 97 89 96  BUN 32* 26* 22 25* 21  CREATININE 1.07* 0.80 0.81 0.93 0.73  CALCIUM 8.9 8.9 9.0 9.2 8.6*  MG  --  1.9 1.7  --  1.7  PHOS  --  1.1* 2.6  --   --    Liver Function Tests: Recent Labs  Lab 08/09/19 0259 08/11/19 1714 08/12/19 0609  AST 32 51* 32  ALT '13 20 15  ' ALKPHOS 55 66 57  BILITOT 0.8 2.4* 1.5*  PROT 6.9 7.0 6.1*  ALBUMIN 3.4* 3.4* 2.9*   No results for input(s): LIPASE, AMYLASE in the last 168 hours. No results for input(s): AMMONIA in the last 168 hours. CBC: Recent Labs  Lab 08/08/19 0246 08/09/19 0259 08/11/19 1714 08/12/19 0609  WBC 6.4 9.1 9.0 7.5  NEUTROABS 4.6  --  5.9  --   HGB 12.8 12.3 14.0 12.8  HCT 45.1 40.8 46.9* 41.8  MCV 90.9 88.1 85.1 87.6  PLT 238 250 273 289   Cardiac Enzymes: No results for input(s): CKTOTAL, CKMB, CKMBINDEX, TROPONINI in the last 168 hours. BNP: BNP (last 3 results) Recent Labs    08/01/19 1823  BNP 413.3*    ProBNP (last 3 results) No results for input(s): PROBNP in the last 8760 hours.  CBG: Recent Labs  Lab 08/08/19 2323 08/09/19 0335  08/09/19 1959 08/09/19 2313 08/12/19 0008  GLUCAP 158* 99 98 120* 64*       Signed:  Alma Friendly, MD [facility] 08/13/2019, 11:37 AM

## 2019-08-13 NOTE — Progress Notes (Signed)
[name] to be D/C'd per MD order. Discussed with the spouse and all questions fully answered. ? VSS, Skin clean, dry and intact without evidence of skin break down, no evidence of skin tears noted. ? IV catheter discontinued intact. Site without signs and symptoms of complications. Dressing and pressure applied. ? An After Visit Summary was printed and given to [name]. He was informed where to pickup prescriptions. ? D/c education completed with patient/family including follow up instructions, medication list, d/c activities limitations if indicated, with other d/c instructions as indicated by MD - patient able to verbalize understanding, all questions fully answered.  ? Patient instructed to return to ED, call 911, or call MD for any changes in condition.  ? Patient to be escorted via stretcher, and D/C home via [facility].

## 2019-08-13 NOTE — ED Notes (Signed)
The pt does not know where she is she is attempting to get out of the bed  Shes hard of hearing no there staff here xecept myself on this side  Charge nurse notified of the need for a sitter

## 2019-08-14 DIAGNOSIS — Z741 Need for assistance with personal care: Secondary | ICD-10-CM | POA: Diagnosis not present

## 2019-08-14 DIAGNOSIS — K589 Irritable bowel syndrome without diarrhea: Secondary | ICD-10-CM | POA: Diagnosis not present

## 2019-08-14 DIAGNOSIS — Z681 Body mass index (BMI) 19 or less, adult: Secondary | ICD-10-CM | POA: Diagnosis not present

## 2019-08-14 DIAGNOSIS — M1990 Unspecified osteoarthritis, unspecified site: Secondary | ICD-10-CM | POA: Diagnosis not present

## 2019-08-14 DIAGNOSIS — G309 Alzheimer's disease, unspecified: Secondary | ICD-10-CM | POA: Diagnosis not present

## 2019-08-14 DIAGNOSIS — E785 Hyperlipidemia, unspecified: Secondary | ICD-10-CM | POA: Diagnosis not present

## 2019-08-14 DIAGNOSIS — I4891 Unspecified atrial fibrillation: Secondary | ICD-10-CM | POA: Diagnosis not present

## 2019-08-14 DIAGNOSIS — I5042 Chronic combined systolic (congestive) and diastolic (congestive) heart failure: Secondary | ICD-10-CM | POA: Diagnosis not present

## 2019-08-14 DIAGNOSIS — F0280 Dementia in other diseases classified elsewhere without behavioral disturbance: Secondary | ICD-10-CM | POA: Diagnosis not present

## 2019-08-14 DIAGNOSIS — J9691 Respiratory failure, unspecified with hypoxia: Secondary | ICD-10-CM | POA: Diagnosis not present

## 2019-08-14 DIAGNOSIS — I110 Hypertensive heart disease with heart failure: Secondary | ICD-10-CM | POA: Diagnosis not present

## 2019-08-17 DIAGNOSIS — I4891 Unspecified atrial fibrillation: Secondary | ICD-10-CM | POA: Diagnosis not present

## 2019-08-17 DIAGNOSIS — F0280 Dementia in other diseases classified elsewhere without behavioral disturbance: Secondary | ICD-10-CM | POA: Diagnosis not present

## 2019-08-17 DIAGNOSIS — G309 Alzheimer's disease, unspecified: Secondary | ICD-10-CM | POA: Diagnosis not present

## 2019-08-17 DIAGNOSIS — I5042 Chronic combined systolic (congestive) and diastolic (congestive) heart failure: Secondary | ICD-10-CM | POA: Diagnosis not present

## 2019-08-17 DIAGNOSIS — J9691 Respiratory failure, unspecified with hypoxia: Secondary | ICD-10-CM | POA: Diagnosis not present

## 2019-08-17 DIAGNOSIS — I110 Hypertensive heart disease with heart failure: Secondary | ICD-10-CM | POA: Diagnosis not present

## 2019-08-27 DIAGNOSIS — I5042 Chronic combined systolic (congestive) and diastolic (congestive) heart failure: Secondary | ICD-10-CM | POA: Diagnosis not present

## 2019-08-27 DIAGNOSIS — J9691 Respiratory failure, unspecified with hypoxia: Secondary | ICD-10-CM | POA: Diagnosis not present

## 2019-08-27 DIAGNOSIS — I4891 Unspecified atrial fibrillation: Secondary | ICD-10-CM | POA: Diagnosis not present

## 2019-08-27 DIAGNOSIS — I110 Hypertensive heart disease with heart failure: Secondary | ICD-10-CM | POA: Diagnosis not present

## 2019-08-27 DIAGNOSIS — G309 Alzheimer's disease, unspecified: Secondary | ICD-10-CM | POA: Diagnosis not present

## 2019-08-27 DIAGNOSIS — F0280 Dementia in other diseases classified elsewhere without behavioral disturbance: Secondary | ICD-10-CM | POA: Diagnosis not present

## 2019-08-31 ENCOUNTER — Ambulatory Visit: Payer: Medicare Other | Admitting: Cardiology

## 2019-09-01 DIAGNOSIS — J9691 Respiratory failure, unspecified with hypoxia: Secondary | ICD-10-CM | POA: Diagnosis not present

## 2019-09-01 DIAGNOSIS — I4891 Unspecified atrial fibrillation: Secondary | ICD-10-CM | POA: Diagnosis not present

## 2019-09-01 DIAGNOSIS — G309 Alzheimer's disease, unspecified: Secondary | ICD-10-CM | POA: Diagnosis not present

## 2019-09-01 DIAGNOSIS — I110 Hypertensive heart disease with heart failure: Secondary | ICD-10-CM | POA: Diagnosis not present

## 2019-09-01 DIAGNOSIS — I5042 Chronic combined systolic (congestive) and diastolic (congestive) heart failure: Secondary | ICD-10-CM | POA: Diagnosis not present

## 2019-09-01 DIAGNOSIS — F0280 Dementia in other diseases classified elsewhere without behavioral disturbance: Secondary | ICD-10-CM | POA: Diagnosis not present

## 2019-09-03 DIAGNOSIS — I5042 Chronic combined systolic (congestive) and diastolic (congestive) heart failure: Secondary | ICD-10-CM | POA: Diagnosis not present

## 2019-09-03 DIAGNOSIS — I4891 Unspecified atrial fibrillation: Secondary | ICD-10-CM | POA: Diagnosis not present

## 2019-09-03 DIAGNOSIS — I110 Hypertensive heart disease with heart failure: Secondary | ICD-10-CM | POA: Diagnosis not present

## 2019-09-03 DIAGNOSIS — J9691 Respiratory failure, unspecified with hypoxia: Secondary | ICD-10-CM | POA: Diagnosis not present

## 2019-09-03 DIAGNOSIS — F0280 Dementia in other diseases classified elsewhere without behavioral disturbance: Secondary | ICD-10-CM | POA: Diagnosis not present

## 2019-09-03 DIAGNOSIS — G309 Alzheimer's disease, unspecified: Secondary | ICD-10-CM | POA: Diagnosis not present

## 2019-09-06 DIAGNOSIS — G309 Alzheimer's disease, unspecified: Secondary | ICD-10-CM | POA: Diagnosis not present

## 2019-09-06 DIAGNOSIS — K589 Irritable bowel syndrome without diarrhea: Secondary | ICD-10-CM | POA: Diagnosis not present

## 2019-09-06 DIAGNOSIS — Z741 Need for assistance with personal care: Secondary | ICD-10-CM | POA: Diagnosis not present

## 2019-09-06 DIAGNOSIS — I4891 Unspecified atrial fibrillation: Secondary | ICD-10-CM | POA: Diagnosis not present

## 2019-09-06 DIAGNOSIS — F0280 Dementia in other diseases classified elsewhere without behavioral disturbance: Secondary | ICD-10-CM | POA: Diagnosis not present

## 2019-09-06 DIAGNOSIS — Z681 Body mass index (BMI) 19 or less, adult: Secondary | ICD-10-CM | POA: Diagnosis not present

## 2019-09-06 DIAGNOSIS — M1990 Unspecified osteoarthritis, unspecified site: Secondary | ICD-10-CM | POA: Diagnosis not present

## 2019-09-06 DIAGNOSIS — I5042 Chronic combined systolic (congestive) and diastolic (congestive) heart failure: Secondary | ICD-10-CM | POA: Diagnosis not present

## 2019-09-06 DIAGNOSIS — J9691 Respiratory failure, unspecified with hypoxia: Secondary | ICD-10-CM | POA: Diagnosis not present

## 2019-09-06 DIAGNOSIS — I110 Hypertensive heart disease with heart failure: Secondary | ICD-10-CM | POA: Diagnosis not present

## 2019-09-06 DIAGNOSIS — E785 Hyperlipidemia, unspecified: Secondary | ICD-10-CM | POA: Diagnosis not present

## 2019-09-10 DIAGNOSIS — I110 Hypertensive heart disease with heart failure: Secondary | ICD-10-CM | POA: Diagnosis not present

## 2019-09-10 DIAGNOSIS — J9691 Respiratory failure, unspecified with hypoxia: Secondary | ICD-10-CM | POA: Diagnosis not present

## 2019-09-10 DIAGNOSIS — F0280 Dementia in other diseases classified elsewhere without behavioral disturbance: Secondary | ICD-10-CM | POA: Diagnosis not present

## 2019-09-10 DIAGNOSIS — I5042 Chronic combined systolic (congestive) and diastolic (congestive) heart failure: Secondary | ICD-10-CM | POA: Diagnosis not present

## 2019-09-10 DIAGNOSIS — G309 Alzheimer's disease, unspecified: Secondary | ICD-10-CM | POA: Diagnosis not present

## 2019-09-10 DIAGNOSIS — I4891 Unspecified atrial fibrillation: Secondary | ICD-10-CM | POA: Diagnosis not present

## 2019-09-16 DIAGNOSIS — I110 Hypertensive heart disease with heart failure: Secondary | ICD-10-CM | POA: Diagnosis not present

## 2019-09-16 DIAGNOSIS — G309 Alzheimer's disease, unspecified: Secondary | ICD-10-CM | POA: Diagnosis not present

## 2019-09-16 DIAGNOSIS — I4891 Unspecified atrial fibrillation: Secondary | ICD-10-CM | POA: Diagnosis not present

## 2019-09-16 DIAGNOSIS — J9691 Respiratory failure, unspecified with hypoxia: Secondary | ICD-10-CM | POA: Diagnosis not present

## 2019-09-16 DIAGNOSIS — F0280 Dementia in other diseases classified elsewhere without behavioral disturbance: Secondary | ICD-10-CM | POA: Diagnosis not present

## 2019-09-16 DIAGNOSIS — I5042 Chronic combined systolic (congestive) and diastolic (congestive) heart failure: Secondary | ICD-10-CM | POA: Diagnosis not present

## 2019-09-23 DIAGNOSIS — J9691 Respiratory failure, unspecified with hypoxia: Secondary | ICD-10-CM | POA: Diagnosis not present

## 2019-09-23 DIAGNOSIS — F0280 Dementia in other diseases classified elsewhere without behavioral disturbance: Secondary | ICD-10-CM | POA: Diagnosis not present

## 2019-09-23 DIAGNOSIS — I5042 Chronic combined systolic (congestive) and diastolic (congestive) heart failure: Secondary | ICD-10-CM | POA: Diagnosis not present

## 2019-09-23 DIAGNOSIS — G309 Alzheimer's disease, unspecified: Secondary | ICD-10-CM | POA: Diagnosis not present

## 2019-09-23 DIAGNOSIS — I110 Hypertensive heart disease with heart failure: Secondary | ICD-10-CM | POA: Diagnosis not present

## 2019-09-23 DIAGNOSIS — I4891 Unspecified atrial fibrillation: Secondary | ICD-10-CM | POA: Diagnosis not present

## 2019-10-01 DIAGNOSIS — J9691 Respiratory failure, unspecified with hypoxia: Secondary | ICD-10-CM | POA: Diagnosis not present

## 2019-10-01 DIAGNOSIS — I110 Hypertensive heart disease with heart failure: Secondary | ICD-10-CM | POA: Diagnosis not present

## 2019-10-01 DIAGNOSIS — I5042 Chronic combined systolic (congestive) and diastolic (congestive) heart failure: Secondary | ICD-10-CM | POA: Diagnosis not present

## 2019-10-01 DIAGNOSIS — I4891 Unspecified atrial fibrillation: Secondary | ICD-10-CM | POA: Diagnosis not present

## 2019-10-01 DIAGNOSIS — G309 Alzheimer's disease, unspecified: Secondary | ICD-10-CM | POA: Diagnosis not present

## 2019-10-01 DIAGNOSIS — F0280 Dementia in other diseases classified elsewhere without behavioral disturbance: Secondary | ICD-10-CM | POA: Diagnosis not present

## 2019-10-07 DIAGNOSIS — E785 Hyperlipidemia, unspecified: Secondary | ICD-10-CM | POA: Diagnosis not present

## 2019-10-07 DIAGNOSIS — I5042 Chronic combined systolic (congestive) and diastolic (congestive) heart failure: Secondary | ICD-10-CM | POA: Diagnosis not present

## 2019-10-07 DIAGNOSIS — I110 Hypertensive heart disease with heart failure: Secondary | ICD-10-CM | POA: Diagnosis not present

## 2019-10-07 DIAGNOSIS — M1990 Unspecified osteoarthritis, unspecified site: Secondary | ICD-10-CM | POA: Diagnosis not present

## 2019-10-07 DIAGNOSIS — I4891 Unspecified atrial fibrillation: Secondary | ICD-10-CM | POA: Diagnosis not present

## 2019-10-07 DIAGNOSIS — F0280 Dementia in other diseases classified elsewhere without behavioral disturbance: Secondary | ICD-10-CM | POA: Diagnosis not present

## 2019-10-07 DIAGNOSIS — Z741 Need for assistance with personal care: Secondary | ICD-10-CM | POA: Diagnosis not present

## 2019-10-07 DIAGNOSIS — Z681 Body mass index (BMI) 19 or less, adult: Secondary | ICD-10-CM | POA: Diagnosis not present

## 2019-10-07 DIAGNOSIS — G309 Alzheimer's disease, unspecified: Secondary | ICD-10-CM | POA: Diagnosis not present

## 2019-10-07 DIAGNOSIS — K589 Irritable bowel syndrome without diarrhea: Secondary | ICD-10-CM | POA: Diagnosis not present

## 2019-10-07 DIAGNOSIS — J9691 Respiratory failure, unspecified with hypoxia: Secondary | ICD-10-CM | POA: Diagnosis not present

## 2019-10-13 DIAGNOSIS — I5042 Chronic combined systolic (congestive) and diastolic (congestive) heart failure: Secondary | ICD-10-CM | POA: Diagnosis not present

## 2019-10-13 DIAGNOSIS — J9691 Respiratory failure, unspecified with hypoxia: Secondary | ICD-10-CM | POA: Diagnosis not present

## 2019-10-13 DIAGNOSIS — F0280 Dementia in other diseases classified elsewhere without behavioral disturbance: Secondary | ICD-10-CM | POA: Diagnosis not present

## 2019-10-13 DIAGNOSIS — G309 Alzheimer's disease, unspecified: Secondary | ICD-10-CM | POA: Diagnosis not present

## 2019-10-13 DIAGNOSIS — I4891 Unspecified atrial fibrillation: Secondary | ICD-10-CM | POA: Diagnosis not present

## 2019-10-13 DIAGNOSIS — I110 Hypertensive heart disease with heart failure: Secondary | ICD-10-CM | POA: Diagnosis not present

## 2019-10-15 DIAGNOSIS — F0280 Dementia in other diseases classified elsewhere without behavioral disturbance: Secondary | ICD-10-CM | POA: Diagnosis not present

## 2019-10-15 DIAGNOSIS — I110 Hypertensive heart disease with heart failure: Secondary | ICD-10-CM | POA: Diagnosis not present

## 2019-10-15 DIAGNOSIS — I5042 Chronic combined systolic (congestive) and diastolic (congestive) heart failure: Secondary | ICD-10-CM | POA: Diagnosis not present

## 2019-10-15 DIAGNOSIS — J9691 Respiratory failure, unspecified with hypoxia: Secondary | ICD-10-CM | POA: Diagnosis not present

## 2019-10-15 DIAGNOSIS — G309 Alzheimer's disease, unspecified: Secondary | ICD-10-CM | POA: Diagnosis not present

## 2019-10-15 DIAGNOSIS — I4891 Unspecified atrial fibrillation: Secondary | ICD-10-CM | POA: Diagnosis not present

## 2019-10-29 DIAGNOSIS — I5042 Chronic combined systolic (congestive) and diastolic (congestive) heart failure: Secondary | ICD-10-CM | POA: Diagnosis not present

## 2019-10-29 DIAGNOSIS — G309 Alzheimer's disease, unspecified: Secondary | ICD-10-CM | POA: Diagnosis not present

## 2019-10-29 DIAGNOSIS — I4891 Unspecified atrial fibrillation: Secondary | ICD-10-CM | POA: Diagnosis not present

## 2019-10-29 DIAGNOSIS — J9691 Respiratory failure, unspecified with hypoxia: Secondary | ICD-10-CM | POA: Diagnosis not present

## 2019-10-29 DIAGNOSIS — I110 Hypertensive heart disease with heart failure: Secondary | ICD-10-CM | POA: Diagnosis not present

## 2019-10-29 DIAGNOSIS — F0280 Dementia in other diseases classified elsewhere without behavioral disturbance: Secondary | ICD-10-CM | POA: Diagnosis not present

## 2019-11-06 DIAGNOSIS — I110 Hypertensive heart disease with heart failure: Secondary | ICD-10-CM | POA: Diagnosis not present

## 2019-11-06 DIAGNOSIS — I4891 Unspecified atrial fibrillation: Secondary | ICD-10-CM | POA: Diagnosis not present

## 2019-11-06 DIAGNOSIS — J9691 Respiratory failure, unspecified with hypoxia: Secondary | ICD-10-CM | POA: Diagnosis not present

## 2019-11-06 DIAGNOSIS — F0280 Dementia in other diseases classified elsewhere without behavioral disturbance: Secondary | ICD-10-CM | POA: Diagnosis not present

## 2019-11-06 DIAGNOSIS — E785 Hyperlipidemia, unspecified: Secondary | ICD-10-CM | POA: Diagnosis not present

## 2019-11-06 DIAGNOSIS — Z681 Body mass index (BMI) 19 or less, adult: Secondary | ICD-10-CM | POA: Diagnosis not present

## 2019-11-06 DIAGNOSIS — G309 Alzheimer's disease, unspecified: Secondary | ICD-10-CM | POA: Diagnosis not present

## 2019-11-06 DIAGNOSIS — K589 Irritable bowel syndrome without diarrhea: Secondary | ICD-10-CM | POA: Diagnosis not present

## 2019-11-06 DIAGNOSIS — M1990 Unspecified osteoarthritis, unspecified site: Secondary | ICD-10-CM | POA: Diagnosis not present

## 2019-11-06 DIAGNOSIS — I5042 Chronic combined systolic (congestive) and diastolic (congestive) heart failure: Secondary | ICD-10-CM | POA: Diagnosis not present

## 2019-11-06 DIAGNOSIS — Z741 Need for assistance with personal care: Secondary | ICD-10-CM | POA: Diagnosis not present

## 2019-11-12 DIAGNOSIS — G309 Alzheimer's disease, unspecified: Secondary | ICD-10-CM | POA: Diagnosis not present

## 2019-11-12 DIAGNOSIS — I4891 Unspecified atrial fibrillation: Secondary | ICD-10-CM | POA: Diagnosis not present

## 2019-11-12 DIAGNOSIS — J9691 Respiratory failure, unspecified with hypoxia: Secondary | ICD-10-CM | POA: Diagnosis not present

## 2019-11-12 DIAGNOSIS — F0280 Dementia in other diseases classified elsewhere without behavioral disturbance: Secondary | ICD-10-CM | POA: Diagnosis not present

## 2019-11-12 DIAGNOSIS — I110 Hypertensive heart disease with heart failure: Secondary | ICD-10-CM | POA: Diagnosis not present

## 2019-11-12 DIAGNOSIS — I5042 Chronic combined systolic (congestive) and diastolic (congestive) heart failure: Secondary | ICD-10-CM | POA: Diagnosis not present

## 2019-11-24 DIAGNOSIS — I4891 Unspecified atrial fibrillation: Secondary | ICD-10-CM | POA: Diagnosis not present

## 2019-11-24 DIAGNOSIS — J9691 Respiratory failure, unspecified with hypoxia: Secondary | ICD-10-CM | POA: Diagnosis not present

## 2019-11-24 DIAGNOSIS — I5042 Chronic combined systolic (congestive) and diastolic (congestive) heart failure: Secondary | ICD-10-CM | POA: Diagnosis not present

## 2019-11-24 DIAGNOSIS — F0280 Dementia in other diseases classified elsewhere without behavioral disturbance: Secondary | ICD-10-CM | POA: Diagnosis not present

## 2019-11-24 DIAGNOSIS — I110 Hypertensive heart disease with heart failure: Secondary | ICD-10-CM | POA: Diagnosis not present

## 2019-11-24 DIAGNOSIS — G309 Alzheimer's disease, unspecified: Secondary | ICD-10-CM | POA: Diagnosis not present

## 2019-12-03 DIAGNOSIS — J9691 Respiratory failure, unspecified with hypoxia: Secondary | ICD-10-CM | POA: Diagnosis not present

## 2019-12-03 DIAGNOSIS — G309 Alzheimer's disease, unspecified: Secondary | ICD-10-CM | POA: Diagnosis not present

## 2019-12-03 DIAGNOSIS — I110 Hypertensive heart disease with heart failure: Secondary | ICD-10-CM | POA: Diagnosis not present

## 2019-12-03 DIAGNOSIS — F0280 Dementia in other diseases classified elsewhere without behavioral disturbance: Secondary | ICD-10-CM | POA: Diagnosis not present

## 2019-12-03 DIAGNOSIS — I4891 Unspecified atrial fibrillation: Secondary | ICD-10-CM | POA: Diagnosis not present

## 2019-12-03 DIAGNOSIS — I5042 Chronic combined systolic (congestive) and diastolic (congestive) heart failure: Secondary | ICD-10-CM | POA: Diagnosis not present

## 2019-12-04 ENCOUNTER — Telehealth: Payer: Self-pay | Admitting: Physician Assistant

## 2019-12-04 NOTE — Telephone Encounter (Signed)
I connected by phone with [name] and/or patient's caregiver on 12/04/2019 at 4:22 PM to discuss the potential vaccination through our Homebound vaccination initiative.   Prevaccination Checklist for COVID-19 Vaccines  1.  Are you feeling sick today? no  2.  Have you ever received a dose of a COVID-19 vaccine?  no      If yes, which one? None   3.  Have you ever had an allergic reaction: (This would include a severe reaction [ e.g., anaphylaxis] that required treatment with epinephrine or EpiPen or that caused you to go to the hospital.  It would also include an allergic reaction that occurred within 4 hours that caused hives, swelling, or respiratory distress, including wheezing.) A.  A previous dose of COVID-19 vaccine. no  B.  A vaccine or injectable therapy that contains multiple components, one of which is a COVID-19 vaccine component, but it is not known which component elicited the immediate reaction. no  C.  Are you allergic to polyethylene glycol? no  D. Are you allergic to Polysorbate, which is found in some vaccines, film coated tablets and intravenous steroids?  no   4.  Have you ever had an allergic reaction to another vaccine (other than COVID-19 vaccine) or an injectable medication? (This would include a severe reaction [ e.g., anaphylaxis] that required treatment with epinephrine or EpiPen or that caused you to go to the hospital.  It would also include an allergic reaction that occurred within 4 hours that caused hives, swelling, or respiratory distress, including wheezing.)  no   5.  Have you ever had a severe allergic reaction (e.g., anaphylaxis) to something other than a component of the COVID-19 vaccine, or any vaccine or injectable medication?  This would include food, pet, venom, environmental, or oral medication allergies.  no   6.  Have you received any vaccine in the last 14 days? no   7.  Have you ever had a positive test for COVID-19 or has a doctor ever told  you that you had COVID-19?  no   8.  Have you received passive antibody therapy (monoclonal antibodies or convalescent serum) as a treatment for COVID-19? no   9.  Do you have a weakened immune system caused by something such as HIV infection or cancer or do you take immunosuppressive drugs or therapies?  no   10.  Do you have a bleeding disorder or are you taking a blood thinner? no   11.  Are you pregnant or breast-feeding? no   12.  Do you have dermal fillers? no   __________________   This patient is a 86 y.o. female that meets the FDA criteria to receive homebound vaccination. Patient or parent/caregiver understands they have the option to accept or refuse homebound vaccination.  Patient passed the pre-screening checklist and would like to proceed with homebound vaccination.  Based on questionnaire above, I recommend the patient be observed for 15 minutes.  There are an estimated no other household members/caregivers who are also interested in receiving the vaccine.    I will send the patient's information to our scheduling team who will reach out to schedule the patient and potential caregiver/family members for homebound vaccination.    Angelena Form 12/04/2019 4:22 PM

## 2019-12-07 DIAGNOSIS — F0280 Dementia in other diseases classified elsewhere without behavioral disturbance: Secondary | ICD-10-CM | POA: Diagnosis not present

## 2019-12-07 DIAGNOSIS — I110 Hypertensive heart disease with heart failure: Secondary | ICD-10-CM | POA: Diagnosis not present

## 2019-12-07 DIAGNOSIS — Z681 Body mass index (BMI) 19 or less, adult: Secondary | ICD-10-CM | POA: Diagnosis not present

## 2019-12-07 DIAGNOSIS — Z741 Need for assistance with personal care: Secondary | ICD-10-CM | POA: Diagnosis not present

## 2019-12-07 DIAGNOSIS — K589 Irritable bowel syndrome without diarrhea: Secondary | ICD-10-CM | POA: Diagnosis not present

## 2019-12-07 DIAGNOSIS — M1990 Unspecified osteoarthritis, unspecified site: Secondary | ICD-10-CM | POA: Diagnosis not present

## 2019-12-07 DIAGNOSIS — I4891 Unspecified atrial fibrillation: Secondary | ICD-10-CM | POA: Diagnosis not present

## 2019-12-07 DIAGNOSIS — I5042 Chronic combined systolic (congestive) and diastolic (congestive) heart failure: Secondary | ICD-10-CM | POA: Diagnosis not present

## 2019-12-07 DIAGNOSIS — G309 Alzheimer's disease, unspecified: Secondary | ICD-10-CM | POA: Diagnosis not present

## 2019-12-07 DIAGNOSIS — E785 Hyperlipidemia, unspecified: Secondary | ICD-10-CM | POA: Diagnosis not present

## 2019-12-07 DIAGNOSIS — J9691 Respiratory failure, unspecified with hypoxia: Secondary | ICD-10-CM | POA: Diagnosis not present

## 2019-12-08 ENCOUNTER — Ambulatory Visit: Payer: Medicare Other | Attending: Critical Care Medicine

## 2019-12-08 DIAGNOSIS — Z23 Encounter for immunization: Secondary | ICD-10-CM

## 2019-12-08 NOTE — Progress Notes (Signed)
   Covid-19 Vaccination Clinic  Name:  AMBUR PROVINCE    MRN: [id number] DOB: 1930/10/14  12/08/2019  Ms. Standlee was observed post Covid-19 immunization for 15 minutes without incident. She was provided with Vaccine Information Sheet and instruction to access the V-Safe system.   Ms. Roldan was instructed to call 911 with any severe reactions post vaccine: Marland Kitchen Difficulty breathing  . Swelling of face and throat  . A fast heartbeat  . A bad rash all over body  . Dizziness and weakness   Immunizations Administered    Name Date Dose VIS Date Route   Pfizer COVID-19 Vaccine 12/08/2019  1:00 PM 0.3 mL 11/25/2019 Intramuscular   Manufacturer: [place]   Lot: [id number]   [place]: [id number]

## 2019-12-10 DIAGNOSIS — I4891 Unspecified atrial fibrillation: Secondary | ICD-10-CM | POA: Diagnosis not present

## 2019-12-10 DIAGNOSIS — J9691 Respiratory failure, unspecified with hypoxia: Secondary | ICD-10-CM | POA: Diagnosis not present

## 2019-12-10 DIAGNOSIS — F0280 Dementia in other diseases classified elsewhere without behavioral disturbance: Secondary | ICD-10-CM | POA: Diagnosis not present

## 2019-12-10 DIAGNOSIS — I5042 Chronic combined systolic (congestive) and diastolic (congestive) heart failure: Secondary | ICD-10-CM | POA: Diagnosis not present

## 2019-12-10 DIAGNOSIS — G309 Alzheimer's disease, unspecified: Secondary | ICD-10-CM | POA: Diagnosis not present

## 2019-12-10 DIAGNOSIS — I110 Hypertensive heart disease with heart failure: Secondary | ICD-10-CM | POA: Diagnosis not present

## 2019-12-17 DIAGNOSIS — I5042 Chronic combined systolic (congestive) and diastolic (congestive) heart failure: Secondary | ICD-10-CM | POA: Diagnosis not present

## 2019-12-17 DIAGNOSIS — G309 Alzheimer's disease, unspecified: Secondary | ICD-10-CM | POA: Diagnosis not present

## 2019-12-17 DIAGNOSIS — F0280 Dementia in other diseases classified elsewhere without behavioral disturbance: Secondary | ICD-10-CM | POA: Diagnosis not present

## 2019-12-17 DIAGNOSIS — I110 Hypertensive heart disease with heart failure: Secondary | ICD-10-CM | POA: Diagnosis not present

## 2019-12-17 DIAGNOSIS — I4891 Unspecified atrial fibrillation: Secondary | ICD-10-CM | POA: Diagnosis not present

## 2019-12-17 DIAGNOSIS — J9691 Respiratory failure, unspecified with hypoxia: Secondary | ICD-10-CM | POA: Diagnosis not present

## 2019-12-24 DIAGNOSIS — G309 Alzheimer's disease, unspecified: Secondary | ICD-10-CM | POA: Diagnosis not present

## 2019-12-24 DIAGNOSIS — F0280 Dementia in other diseases classified elsewhere without behavioral disturbance: Secondary | ICD-10-CM | POA: Diagnosis not present

## 2019-12-24 DIAGNOSIS — I110 Hypertensive heart disease with heart failure: Secondary | ICD-10-CM | POA: Diagnosis not present

## 2019-12-24 DIAGNOSIS — J9691 Respiratory failure, unspecified with hypoxia: Secondary | ICD-10-CM | POA: Diagnosis not present

## 2019-12-24 DIAGNOSIS — I5042 Chronic combined systolic (congestive) and diastolic (congestive) heart failure: Secondary | ICD-10-CM | POA: Diagnosis not present

## 2019-12-24 DIAGNOSIS — I4891 Unspecified atrial fibrillation: Secondary | ICD-10-CM | POA: Diagnosis not present

## 2019-12-25 DIAGNOSIS — I4891 Unspecified atrial fibrillation: Secondary | ICD-10-CM | POA: Diagnosis not present

## 2019-12-25 DIAGNOSIS — J9691 Respiratory failure, unspecified with hypoxia: Secondary | ICD-10-CM | POA: Diagnosis not present

## 2019-12-25 DIAGNOSIS — F0280 Dementia in other diseases classified elsewhere without behavioral disturbance: Secondary | ICD-10-CM | POA: Diagnosis not present

## 2019-12-25 DIAGNOSIS — I110 Hypertensive heart disease with heart failure: Secondary | ICD-10-CM | POA: Diagnosis not present

## 2019-12-25 DIAGNOSIS — G309 Alzheimer's disease, unspecified: Secondary | ICD-10-CM | POA: Diagnosis not present

## 2019-12-25 DIAGNOSIS — I5042 Chronic combined systolic (congestive) and diastolic (congestive) heart failure: Secondary | ICD-10-CM | POA: Diagnosis not present

## 2019-12-29 DIAGNOSIS — I110 Hypertensive heart disease with heart failure: Secondary | ICD-10-CM | POA: Diagnosis not present

## 2019-12-29 DIAGNOSIS — F0280 Dementia in other diseases classified elsewhere without behavioral disturbance: Secondary | ICD-10-CM | POA: Diagnosis not present

## 2019-12-29 DIAGNOSIS — I5042 Chronic combined systolic (congestive) and diastolic (congestive) heart failure: Secondary | ICD-10-CM | POA: Diagnosis not present

## 2019-12-29 DIAGNOSIS — J9691 Respiratory failure, unspecified with hypoxia: Secondary | ICD-10-CM | POA: Diagnosis not present

## 2019-12-29 DIAGNOSIS — I4891 Unspecified atrial fibrillation: Secondary | ICD-10-CM | POA: Diagnosis not present

## 2019-12-29 DIAGNOSIS — G309 Alzheimer's disease, unspecified: Secondary | ICD-10-CM | POA: Diagnosis not present

## 2019-12-30 DIAGNOSIS — I5042 Chronic combined systolic (congestive) and diastolic (congestive) heart failure: Secondary | ICD-10-CM | POA: Diagnosis not present

## 2019-12-30 DIAGNOSIS — I4891 Unspecified atrial fibrillation: Secondary | ICD-10-CM | POA: Diagnosis not present

## 2019-12-30 DIAGNOSIS — J9691 Respiratory failure, unspecified with hypoxia: Secondary | ICD-10-CM | POA: Diagnosis not present

## 2019-12-30 DIAGNOSIS — F0280 Dementia in other diseases classified elsewhere without behavioral disturbance: Secondary | ICD-10-CM | POA: Diagnosis not present

## 2019-12-30 DIAGNOSIS — I110 Hypertensive heart disease with heart failure: Secondary | ICD-10-CM | POA: Diagnosis not present

## 2019-12-30 DIAGNOSIS — G309 Alzheimer's disease, unspecified: Secondary | ICD-10-CM | POA: Diagnosis not present

## 2020-01-06 DIAGNOSIS — K589 Irritable bowel syndrome without diarrhea: Secondary | ICD-10-CM | POA: Diagnosis not present

## 2020-01-06 DIAGNOSIS — Z741 Need for assistance with personal care: Secondary | ICD-10-CM | POA: Diagnosis not present

## 2020-01-06 DIAGNOSIS — F0280 Dementia in other diseases classified elsewhere without behavioral disturbance: Secondary | ICD-10-CM | POA: Diagnosis not present

## 2020-01-06 DIAGNOSIS — I5042 Chronic combined systolic (congestive) and diastolic (congestive) heart failure: Secondary | ICD-10-CM | POA: Diagnosis not present

## 2020-01-06 DIAGNOSIS — I110 Hypertensive heart disease with heart failure: Secondary | ICD-10-CM | POA: Diagnosis not present

## 2020-01-06 DIAGNOSIS — I4891 Unspecified atrial fibrillation: Secondary | ICD-10-CM | POA: Diagnosis not present

## 2020-01-06 DIAGNOSIS — Z681 Body mass index (BMI) 19 or less, adult: Secondary | ICD-10-CM | POA: Diagnosis not present

## 2020-01-06 DIAGNOSIS — G309 Alzheimer's disease, unspecified: Secondary | ICD-10-CM | POA: Diagnosis not present

## 2020-01-06 DIAGNOSIS — E785 Hyperlipidemia, unspecified: Secondary | ICD-10-CM | POA: Diagnosis not present

## 2020-01-06 DIAGNOSIS — M1990 Unspecified osteoarthritis, unspecified site: Secondary | ICD-10-CM | POA: Diagnosis not present

## 2020-01-06 DIAGNOSIS — J9691 Respiratory failure, unspecified with hypoxia: Secondary | ICD-10-CM | POA: Diagnosis not present

## 2020-01-07 DIAGNOSIS — G309 Alzheimer's disease, unspecified: Secondary | ICD-10-CM | POA: Diagnosis not present

## 2020-01-07 DIAGNOSIS — I110 Hypertensive heart disease with heart failure: Secondary | ICD-10-CM | POA: Diagnosis not present

## 2020-01-07 DIAGNOSIS — J9691 Respiratory failure, unspecified with hypoxia: Secondary | ICD-10-CM | POA: Diagnosis not present

## 2020-01-07 DIAGNOSIS — I5042 Chronic combined systolic (congestive) and diastolic (congestive) heart failure: Secondary | ICD-10-CM | POA: Diagnosis not present

## 2020-01-07 DIAGNOSIS — F0280 Dementia in other diseases classified elsewhere without behavioral disturbance: Secondary | ICD-10-CM | POA: Diagnosis not present

## 2020-01-07 DIAGNOSIS — I4891 Unspecified atrial fibrillation: Secondary | ICD-10-CM | POA: Diagnosis not present

## 2020-01-11 DIAGNOSIS — I110 Hypertensive heart disease with heart failure: Secondary | ICD-10-CM | POA: Diagnosis not present

## 2020-01-11 DIAGNOSIS — G309 Alzheimer's disease, unspecified: Secondary | ICD-10-CM | POA: Diagnosis not present

## 2020-01-11 DIAGNOSIS — J9691 Respiratory failure, unspecified with hypoxia: Secondary | ICD-10-CM | POA: Diagnosis not present

## 2020-01-11 DIAGNOSIS — I5042 Chronic combined systolic (congestive) and diastolic (congestive) heart failure: Secondary | ICD-10-CM | POA: Diagnosis not present

## 2020-01-11 DIAGNOSIS — F0280 Dementia in other diseases classified elsewhere without behavioral disturbance: Secondary | ICD-10-CM | POA: Diagnosis not present

## 2020-01-11 DIAGNOSIS — I4891 Unspecified atrial fibrillation: Secondary | ICD-10-CM | POA: Diagnosis not present

## 2020-01-14 DIAGNOSIS — G309 Alzheimer's disease, unspecified: Secondary | ICD-10-CM | POA: Diagnosis not present

## 2020-01-14 DIAGNOSIS — I4891 Unspecified atrial fibrillation: Secondary | ICD-10-CM | POA: Diagnosis not present

## 2020-01-14 DIAGNOSIS — F0280 Dementia in other diseases classified elsewhere without behavioral disturbance: Secondary | ICD-10-CM | POA: Diagnosis not present

## 2020-01-14 DIAGNOSIS — J9691 Respiratory failure, unspecified with hypoxia: Secondary | ICD-10-CM | POA: Diagnosis not present

## 2020-01-14 DIAGNOSIS — I5042 Chronic combined systolic (congestive) and diastolic (congestive) heart failure: Secondary | ICD-10-CM | POA: Diagnosis not present

## 2020-01-14 DIAGNOSIS — I110 Hypertensive heart disease with heart failure: Secondary | ICD-10-CM | POA: Diagnosis not present

## 2020-01-20 DIAGNOSIS — I110 Hypertensive heart disease with heart failure: Secondary | ICD-10-CM | POA: Diagnosis not present

## 2020-01-20 DIAGNOSIS — G309 Alzheimer's disease, unspecified: Secondary | ICD-10-CM | POA: Diagnosis not present

## 2020-01-20 DIAGNOSIS — I4891 Unspecified atrial fibrillation: Secondary | ICD-10-CM | POA: Diagnosis not present

## 2020-01-20 DIAGNOSIS — I5042 Chronic combined systolic (congestive) and diastolic (congestive) heart failure: Secondary | ICD-10-CM | POA: Diagnosis not present

## 2020-01-20 DIAGNOSIS — J9691 Respiratory failure, unspecified with hypoxia: Secondary | ICD-10-CM | POA: Diagnosis not present

## 2020-01-20 DIAGNOSIS — F0280 Dementia in other diseases classified elsewhere without behavioral disturbance: Secondary | ICD-10-CM | POA: Diagnosis not present

## 2020-01-21 DIAGNOSIS — J9691 Respiratory failure, unspecified with hypoxia: Secondary | ICD-10-CM | POA: Diagnosis not present

## 2020-01-21 DIAGNOSIS — I4891 Unspecified atrial fibrillation: Secondary | ICD-10-CM | POA: Diagnosis not present

## 2020-01-21 DIAGNOSIS — I110 Hypertensive heart disease with heart failure: Secondary | ICD-10-CM | POA: Diagnosis not present

## 2020-01-21 DIAGNOSIS — I5042 Chronic combined systolic (congestive) and diastolic (congestive) heart failure: Secondary | ICD-10-CM | POA: Diagnosis not present

## 2020-01-21 DIAGNOSIS — G309 Alzheimer's disease, unspecified: Secondary | ICD-10-CM | POA: Diagnosis not present

## 2020-01-21 DIAGNOSIS — F0280 Dementia in other diseases classified elsewhere without behavioral disturbance: Secondary | ICD-10-CM | POA: Diagnosis not present

## 2020-01-28 DIAGNOSIS — G309 Alzheimer's disease, unspecified: Secondary | ICD-10-CM | POA: Diagnosis not present

## 2020-01-28 DIAGNOSIS — F0280 Dementia in other diseases classified elsewhere without behavioral disturbance: Secondary | ICD-10-CM | POA: Diagnosis not present

## 2020-01-28 DIAGNOSIS — I4891 Unspecified atrial fibrillation: Secondary | ICD-10-CM | POA: Diagnosis not present

## 2020-01-28 DIAGNOSIS — J9691 Respiratory failure, unspecified with hypoxia: Secondary | ICD-10-CM | POA: Diagnosis not present

## 2020-01-28 DIAGNOSIS — I5042 Chronic combined systolic (congestive) and diastolic (congestive) heart failure: Secondary | ICD-10-CM | POA: Diagnosis not present

## 2020-01-28 DIAGNOSIS — I110 Hypertensive heart disease with heart failure: Secondary | ICD-10-CM | POA: Diagnosis not present

## 2020-02-04 DIAGNOSIS — F0280 Dementia in other diseases classified elsewhere without behavioral disturbance: Secondary | ICD-10-CM | POA: Diagnosis not present

## 2020-02-04 DIAGNOSIS — I4891 Unspecified atrial fibrillation: Secondary | ICD-10-CM | POA: Diagnosis not present

## 2020-02-04 DIAGNOSIS — J9691 Respiratory failure, unspecified with hypoxia: Secondary | ICD-10-CM | POA: Diagnosis not present

## 2020-02-04 DIAGNOSIS — G309 Alzheimer's disease, unspecified: Secondary | ICD-10-CM | POA: Diagnosis not present

## 2020-02-04 DIAGNOSIS — I110 Hypertensive heart disease with heart failure: Secondary | ICD-10-CM | POA: Diagnosis not present

## 2020-02-04 DIAGNOSIS — I5042 Chronic combined systolic (congestive) and diastolic (congestive) heart failure: Secondary | ICD-10-CM | POA: Diagnosis not present

## 2020-02-06 DIAGNOSIS — I5042 Chronic combined systolic (congestive) and diastolic (congestive) heart failure: Secondary | ICD-10-CM | POA: Diagnosis not present

## 2020-02-06 DIAGNOSIS — I110 Hypertensive heart disease with heart failure: Secondary | ICD-10-CM | POA: Diagnosis not present

## 2020-02-06 DIAGNOSIS — I4891 Unspecified atrial fibrillation: Secondary | ICD-10-CM | POA: Diagnosis not present

## 2020-02-06 DIAGNOSIS — K589 Irritable bowel syndrome without diarrhea: Secondary | ICD-10-CM | POA: Diagnosis not present

## 2020-02-06 DIAGNOSIS — M1990 Unspecified osteoarthritis, unspecified site: Secondary | ICD-10-CM | POA: Diagnosis not present

## 2020-02-06 DIAGNOSIS — J9691 Respiratory failure, unspecified with hypoxia: Secondary | ICD-10-CM | POA: Diagnosis not present

## 2020-02-06 DIAGNOSIS — F0280 Dementia in other diseases classified elsewhere without behavioral disturbance: Secondary | ICD-10-CM | POA: Diagnosis not present

## 2020-02-06 DIAGNOSIS — Z681 Body mass index (BMI) 19 or less, adult: Secondary | ICD-10-CM | POA: Diagnosis not present

## 2020-02-06 DIAGNOSIS — G309 Alzheimer's disease, unspecified: Secondary | ICD-10-CM | POA: Diagnosis not present

## 2020-02-06 DIAGNOSIS — Z741 Need for assistance with personal care: Secondary | ICD-10-CM | POA: Diagnosis not present

## 2020-02-06 DIAGNOSIS — E785 Hyperlipidemia, unspecified: Secondary | ICD-10-CM | POA: Diagnosis not present

## 2020-02-11 DIAGNOSIS — I4891 Unspecified atrial fibrillation: Secondary | ICD-10-CM | POA: Diagnosis not present

## 2020-02-11 DIAGNOSIS — I5042 Chronic combined systolic (congestive) and diastolic (congestive) heart failure: Secondary | ICD-10-CM | POA: Diagnosis not present

## 2020-02-11 DIAGNOSIS — J9691 Respiratory failure, unspecified with hypoxia: Secondary | ICD-10-CM | POA: Diagnosis not present

## 2020-02-11 DIAGNOSIS — F0280 Dementia in other diseases classified elsewhere without behavioral disturbance: Secondary | ICD-10-CM | POA: Diagnosis not present

## 2020-02-11 DIAGNOSIS — I110 Hypertensive heart disease with heart failure: Secondary | ICD-10-CM | POA: Diagnosis not present

## 2020-02-11 DIAGNOSIS — G309 Alzheimer's disease, unspecified: Secondary | ICD-10-CM | POA: Diagnosis not present

## 2020-02-18 DIAGNOSIS — G309 Alzheimer's disease, unspecified: Secondary | ICD-10-CM | POA: Diagnosis not present

## 2020-02-18 DIAGNOSIS — F0280 Dementia in other diseases classified elsewhere without behavioral disturbance: Secondary | ICD-10-CM | POA: Diagnosis not present

## 2020-02-18 DIAGNOSIS — I4891 Unspecified atrial fibrillation: Secondary | ICD-10-CM | POA: Diagnosis not present

## 2020-02-18 DIAGNOSIS — I5042 Chronic combined systolic (congestive) and diastolic (congestive) heart failure: Secondary | ICD-10-CM | POA: Diagnosis not present

## 2020-02-18 DIAGNOSIS — I110 Hypertensive heart disease with heart failure: Secondary | ICD-10-CM | POA: Diagnosis not present

## 2020-02-18 DIAGNOSIS — J9691 Respiratory failure, unspecified with hypoxia: Secondary | ICD-10-CM | POA: Diagnosis not present

## 2020-02-25 DIAGNOSIS — F0280 Dementia in other diseases classified elsewhere without behavioral disturbance: Secondary | ICD-10-CM | POA: Diagnosis not present

## 2020-02-25 DIAGNOSIS — I5042 Chronic combined systolic (congestive) and diastolic (congestive) heart failure: Secondary | ICD-10-CM | POA: Diagnosis not present

## 2020-02-25 DIAGNOSIS — I110 Hypertensive heart disease with heart failure: Secondary | ICD-10-CM | POA: Diagnosis not present

## 2020-02-25 DIAGNOSIS — G309 Alzheimer's disease, unspecified: Secondary | ICD-10-CM | POA: Diagnosis not present

## 2020-02-25 DIAGNOSIS — I4891 Unspecified atrial fibrillation: Secondary | ICD-10-CM | POA: Diagnosis not present

## 2020-02-25 DIAGNOSIS — J9691 Respiratory failure, unspecified with hypoxia: Secondary | ICD-10-CM | POA: Diagnosis not present

## 2020-03-01 ENCOUNTER — Ambulatory Visit: Payer: Medicare Other | Attending: Critical Care Medicine

## 2020-03-01 DIAGNOSIS — Z23 Encounter for immunization: Secondary | ICD-10-CM

## 2020-03-01 NOTE — Progress Notes (Signed)
   Covid-19 Vaccination Clinic  Name:  JATARA HUETTNER    MRN: [id number] DOB: [birth year]  03/01/2020  Ms. Reynoso was observed post Covid-19 immunization for 15 minutes without incident. She was provided with Vaccine Information Sheet and instruction to access the V-Safe system.   Ms. Brightbill was instructed to call 911 with any severe reactions post vaccine: Marland Kitchen Difficulty breathing  . Swelling of face and throat  . A fast heartbeat  . A bad rash all over body  . Dizziness and weakness   Immunizations Administered    Name Date Dose VIS Date Route   PFIZER Comrnaty([place] TOP) Covid-19 Vaccine 03/01/2020  1:26 PM 0.3 mL 01/14/2020 Intramuscular   Manufacturer: [place]   Lot: [id number]   [place]: [id number]

## 2020-03-03 DIAGNOSIS — I5042 Chronic combined systolic (congestive) and diastolic (congestive) heart failure: Secondary | ICD-10-CM | POA: Diagnosis not present

## 2020-03-03 DIAGNOSIS — F0280 Dementia in other diseases classified elsewhere without behavioral disturbance: Secondary | ICD-10-CM | POA: Diagnosis not present

## 2020-03-03 DIAGNOSIS — J9691 Respiratory failure, unspecified with hypoxia: Secondary | ICD-10-CM | POA: Diagnosis not present

## 2020-03-03 DIAGNOSIS — I110 Hypertensive heart disease with heart failure: Secondary | ICD-10-CM | POA: Diagnosis not present

## 2020-03-03 DIAGNOSIS — I4891 Unspecified atrial fibrillation: Secondary | ICD-10-CM | POA: Diagnosis not present

## 2020-03-03 DIAGNOSIS — G309 Alzheimer's disease, unspecified: Secondary | ICD-10-CM | POA: Diagnosis not present

## 2020-03-04 DIAGNOSIS — F0280 Dementia in other diseases classified elsewhere without behavioral disturbance: Secondary | ICD-10-CM | POA: Diagnosis not present

## 2020-03-04 DIAGNOSIS — I5042 Chronic combined systolic (congestive) and diastolic (congestive) heart failure: Secondary | ICD-10-CM | POA: Diagnosis not present

## 2020-03-04 DIAGNOSIS — G309 Alzheimer's disease, unspecified: Secondary | ICD-10-CM | POA: Diagnosis not present

## 2020-03-04 DIAGNOSIS — J9691 Respiratory failure, unspecified with hypoxia: Secondary | ICD-10-CM | POA: Diagnosis not present

## 2020-03-04 DIAGNOSIS — I110 Hypertensive heart disease with heart failure: Secondary | ICD-10-CM | POA: Diagnosis not present

## 2020-03-04 DIAGNOSIS — I4891 Unspecified atrial fibrillation: Secondary | ICD-10-CM | POA: Diagnosis not present

## 2020-03-05 DIAGNOSIS — I110 Hypertensive heart disease with heart failure: Secondary | ICD-10-CM | POA: Diagnosis not present

## 2020-03-05 DIAGNOSIS — J9691 Respiratory failure, unspecified with hypoxia: Secondary | ICD-10-CM | POA: Diagnosis not present

## 2020-03-05 DIAGNOSIS — F0280 Dementia in other diseases classified elsewhere without behavioral disturbance: Secondary | ICD-10-CM | POA: Diagnosis not present

## 2020-03-05 DIAGNOSIS — I4891 Unspecified atrial fibrillation: Secondary | ICD-10-CM | POA: Diagnosis not present

## 2020-03-05 DIAGNOSIS — G309 Alzheimer's disease, unspecified: Secondary | ICD-10-CM | POA: Diagnosis not present

## 2020-03-05 DIAGNOSIS — I5042 Chronic combined systolic (congestive) and diastolic (congestive) heart failure: Secondary | ICD-10-CM | POA: Diagnosis not present

## 2020-03-06 DIAGNOSIS — I4891 Unspecified atrial fibrillation: Secondary | ICD-10-CM | POA: Diagnosis not present

## 2020-03-06 DIAGNOSIS — J9691 Respiratory failure, unspecified with hypoxia: Secondary | ICD-10-CM | POA: Diagnosis not present

## 2020-03-06 DIAGNOSIS — I110 Hypertensive heart disease with heart failure: Secondary | ICD-10-CM | POA: Diagnosis not present

## 2020-03-06 DIAGNOSIS — I5042 Chronic combined systolic (congestive) and diastolic (congestive) heart failure: Secondary | ICD-10-CM | POA: Diagnosis not present

## 2020-03-06 DIAGNOSIS — F0280 Dementia in other diseases classified elsewhere without behavioral disturbance: Secondary | ICD-10-CM | POA: Diagnosis not present

## 2020-03-06 DIAGNOSIS — G309 Alzheimer's disease, unspecified: Secondary | ICD-10-CM | POA: Diagnosis not present

## 2020-03-08 DEATH — deceased
